# Patient Record
Sex: Male | Born: 1950 | Race: White | Hispanic: No | Marital: Married | Smoking: Never smoker
Health system: Southern US, Community
[De-identification: ages and names within clinical notes are randomized; demographics above are authoritative.]

## PROBLEM LIST (undated history)

## (undated) DIAGNOSIS — F32A Depression, unspecified: Secondary | ICD-10-CM

## (undated) DIAGNOSIS — J45909 Unspecified asthma, uncomplicated: Secondary | ICD-10-CM

## (undated) DIAGNOSIS — H269 Unspecified cataract: Secondary | ICD-10-CM

## (undated) DIAGNOSIS — G629 Polyneuropathy, unspecified: Secondary | ICD-10-CM

## (undated) DIAGNOSIS — F329 Major depressive disorder, single episode, unspecified: Secondary | ICD-10-CM

## (undated) DIAGNOSIS — E119 Type 2 diabetes mellitus without complications: Secondary | ICD-10-CM

## (undated) DIAGNOSIS — M86461 Chronic osteomyelitis with draining sinus, right tibia and fibula: Secondary | ICD-10-CM

## (undated) DIAGNOSIS — E785 Hyperlipidemia, unspecified: Secondary | ICD-10-CM

## (undated) DIAGNOSIS — G4730 Sleep apnea, unspecified: Secondary | ICD-10-CM

## (undated) HISTORY — DX: Major depressive disorder, single episode, unspecified: F32.9

## (undated) HISTORY — PX: OTHER SURGICAL HISTORY: SHX169

## (undated) HISTORY — DX: Depression, unspecified: F32.A

## (undated) HISTORY — DX: Unspecified asthma, uncomplicated: J45.909

## (undated) HISTORY — PX: COLONOSCOPY: SHX174

## (undated) HISTORY — DX: Hyperlipidemia, unspecified: E78.5

## (undated) HISTORY — DX: Type 2 diabetes mellitus without complications: E11.9

## (undated) HISTORY — DX: Unspecified cataract: H26.9

## (undated) HISTORY — PX: EYE SURGERY: SHX253

## (undated) HISTORY — PX: ORBITAL RECONSTRUCTION: SHX2115

---
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
## 2011-03-01 ENCOUNTER — Encounter (HOSPITAL_BASED_OUTPATIENT_CLINIC_OR_DEPARTMENT_OTHER): Payer: Self-pay | Attending: Internal Medicine

## 2011-03-01 DIAGNOSIS — S98139A Complete traumatic amputation of one unspecified lesser toe, initial encounter: Secondary | ICD-10-CM | POA: Insufficient documentation

## 2011-03-01 DIAGNOSIS — Z794 Long term (current) use of insulin: Secondary | ICD-10-CM | POA: Insufficient documentation

## 2011-03-01 DIAGNOSIS — Z87898 Personal history of other specified conditions: Secondary | ICD-10-CM | POA: Insufficient documentation

## 2011-03-01 DIAGNOSIS — Z79899 Other long term (current) drug therapy: Secondary | ICD-10-CM | POA: Insufficient documentation

## 2011-03-01 DIAGNOSIS — L97509 Non-pressure chronic ulcer of other part of unspecified foot with unspecified severity: Secondary | ICD-10-CM | POA: Insufficient documentation

## 2011-03-01 DIAGNOSIS — E1169 Type 2 diabetes mellitus with other specified complication: Secondary | ICD-10-CM | POA: Insufficient documentation

## 2011-03-02 NOTE — Progress Notes (Unsigned)
[facility]  NAME:  Rick Lane, Rick Lane                ACCOUNT NO.:  [id number]  MEDICAL RECORD NO.:  [id number]      DATE OF BIRTH:  1950/10/23  PHYSICIAN:  Jonelle Sports. Allyssa Abruzzese, M.D.  VISIT DATE:  03/01/2011                                  OFFICE VISIT   HISTORY:  This 60 year old white male with type 2 diabetes who just moved to [facility] the day prior to this consultation, is seen for evaluation and ongoing treatment of recent extensive surgery of the right foot.  The patient apparently stepped on a piece of glass in about mid April 2012, pulled the glass out himself and attempted to treat his lacerated foot on his own for the first 2-1/2 weeks.  Over that period of time, it became progressively swollen with drainage, odor, and skin discoloration, leading to his presentation eventually to hospital emergency room on Nov 25, 2010.  At that time, it was appreciated that his foot was gangrenous and second toe was gangrenous and the entire foot was deeply infected with erythema extending all the way to the ankle, generalized edema, and other evidence for deep infection.  He was admitted to the hospital and the following day, underwent a ray resection and second ray of that right foot underwent as well, irrigation of the wound and placement of vancomycin, containing beads. The wound was drained and closed.  Apparently subsequent to that, he did generally well but the wound did not exactly healed and finally after ongoing interventions and extensive intravenous antibiotics, he underwent on December 25, 2010, with general debridement of the wound, placement of Apligraf to multiple sites (at that point, he has had ulcerations laterally on the foot, on the plantar aspect of the foot, and 3 other lesser sites as well).  He underwent Apligraf 4 on the dorsolateral aspect of the foot in the wound base, one on the plantar aspect.  The primary wound was then covered with a GraftJacket  applied to the space left by the previous second ray amputation.  Apparently since that time, the wounds have healed other than the primary surgical wound and the GraftJacket there remains in place.  He has been told this will slough itself as the wound heals underneath. Apparently, his antibiotics were continued until 48 hours prior to this consultation here today and at that point, his PICC line discontinued. Notes obtained since the patient's departure from the clinic today from the surgeons in [place] show that the intent had been to continue him on Augmentin orally for at least 2 additional weeks but that was never established.  He will be seen in this clinic again in 48 hours and decision in that regard will be made at that time.  His past medical history is notable for no surgeries other than the toe amputation mentioned.  He has had medical hospitalizations in association with his diabetes, but not much else.  He has no known medicinal allergies.  His regular medications at the moment include: 1. Lantus insulin 30 units daily subcu. 2. Januvia 50 mg daily p.o. 3. Glyburide 2.5 mg daily p.o. 4. Zoloft 50 mg daily p.o.  The patient's family history is not reviewed in detail, but apparently is positive for type 2 diabetes in several other family  members.  PERSONAL HISTORY:  The patient is married, lives with his wife, has just moved to [place] in the past 48 hours as indicated.  He does not smoke or use alcohol or recreational drugs.  He was previously employed by [employer] in the [employer] area but that employment recently ended and he is vague about the details and apparently very unhappy and depressed about that and no longer carries the insurance that he had when he was with [employer].  REVIEW OF SYSTEMS:  HEAD, EYES, EARS, NOSE, and THROAT:  The patient denies any specific diabetic problems with his eyes, although he does have cataracts which  certainly may well be related.  His hearing is good.  He had asthma as a child but has no significant upper respiratory allergies at this stage in his life.  PULMONARY:  No history of pneumonia, tuberculosis.  No current symptomatology.  No residual from his childhood asthma.  CARDIAC:  No history of angina, known heart disease, or use of any cardiac medications.  GASTROINTESTINAL:  No history of blood loss, significant reflux, chronic diarrhea, or hepatobiliary disease.  GENITOURINARY:  Unaware of any prostate problems, has no history of urinary tract infection.  ORTHOPEDIC:  No history of lumbar disk disease, no major joint problems, no joint surgeries.  NEUROLOGIC:  Does have some neuropathic symptoms in his distal lower extremities, but otherwise has had no history of seizures, CVA, or other central nervous issues.  PHYSICAL EXAMINATION:  VITAL SIGNS:  Blood pressure is 162/82, pulse is 89 and regular, respirations 18, temperature 98.1, blood glucose 110 mg/daily. GENERAL:  This is a healthy-appearing late middle-aged white male, in no immediate distress.  His wife joins him in the clinic, both are alert and cooperative.  He does seem to, however, some resentment and depression related to his recent employment change. SKIN:  Warm and dry. HEENT:  Mucous membranes are moist.  Dental, dentition appears satisfactory. NECK:  There is no neck vein engorgement.  Carotid pulses are palpable. No obvious thyromegaly. CHEST:  Grossly clear to auscultation. CARDIAC:  Heart tones are good quality.  There is an S4 heard at the cardiac base. ABDOMEN:  Nontender without organomegaly or masses. EXTREMITIES:  Free of edema.  There are some stasis changes in the distal lower extremities bilaterally, but no evidence of prior ulcerations there.  The pulses are palpable at all 4 locations in the feet and appeared adequate.  There is surgical absence of the left second toe with evidence of a ray  amputation and that area now is covered with a GraftJacket and there is some inflammatory change at the margins of this, but nothing excessive.  There is a very fluctuant fluid- filled component to the wound along the area of the first metatarsal which the patient was unaware of.  There is no significant odor.  There appears to be no surrounding imminent tissue loss at this time.  An area on the plantar aspect of the foot that appears to be the site of previous ulcer is well epithelialized, presumably secondary to the previous placement of an Apligraf in that area.  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  Currently, has Apligraf that have been placed under a GraftJacket and the wound appears to be healing satisfactorily.  DISPOSITION: 1. Request is made for the patient's records which will be absolutely     essential to  his continued management. 2. The GraftJacket is debrided around its margins where there is some     loose tissue.  In addition, the area that appears fluctuant is     pierced and serous fluid expressed, this is cultured.  There is no     odor and no gross purulence to this material.  The patient's foot is then dressed with application of silver alginate over the wound area to deal with any continued drainage from the puncture site that I have created and the foot is then placed in a bulky wrap.  He has previously been in a posterior mold splint which is absolutely totally broken down and is discarded.  My plan is to place him in a walking boot today, but the fact that we do not have a boot of adequate size.  Accordingly, he was placed in a Darco walker with care taken to be sure that we do not irritate the recently healed ulcer on the plantar aspect of that foot.  Arrangements were made for his return to this clinic in 2 days for evaluation by our clinic  director, plastic surgeon.          ______________________________ Jonelle Sports Cheryll Cockayne, M.D.     RES/MEDQ  D:  03/01/2011  T:  03/02/2011  Job:  [id number]

## 2011-03-17 NOTE — Progress Notes (Signed)
[facility]  NAME:  Rick Lane, Rick Lane NO.:  [id number]  MEDICAL RECORD NO.:  [id number]      DATE OF BIRTH:  [birth year]  PHYSICIAN:  Wayland Denis, DO            VISIT DATE:                                  OFFICE VISIT   Rick Lane is a 60 year old white male who is here for followup with his wife for a right foot ulcer.  We did quite a bit of debriding last week and used Santyl and hydrogel.  He actually had some good improvement with some granulating tissue.  He does have a tendon exposed, this is going to be a little difficult to granulate over now a bit of a cavity, but still possible.  There have been no changes in his medications, social history.  He is still working through the process of getting all the paperwork filled out for financial aid.  We would like him to continue with that.  He does not see how hyperbaric will work for him, so that is not an option.  PHYSICAL EXAMINATION:  GENERAL:  He is alert, oriented, cooperative, no acute distress, pleasant. HEENT:  Pupils equal.  Extraocular muscles are intact. NECK:  No cervical lymphadenopathy. LUNGS:  Breathing is unlabored. HEART:  Regular. EXTREMITIES:  Lower extremities as described and was debrided down to and including tendon.  We will continue with Santyl, collagen, hydrogel, alginate, Kerlix netting and followup in a week.  We may also be able to place Oasis on him.     Wayland Denis, DO     CS/MEDQ  D:  03/17/2011  T:  03/17/2011  Job:  [id number]

## 2011-03-24 NOTE — Progress Notes (Signed)
[facility]  NAME:  KASH, DAVIE NO.:  [id number]  MEDICAL RECORD NO.:  [id number]      DATE OF BIRTH:  [birth year]  PHYSICIAN:  Wayland Denis, DO       VISIT DATE:  03/24/2011                                  OFFICE VISIT   [name] is a 60 year old white male here with his wife for followup on his right lower extremity diabetic foot ulcer.  He has been using Santyl, alginate, and collagen over this past week.  Some drains came through the dressing, so he dressed it with Silvadene.  Otherwise, he has been doing well.  He is still on worker's comp and going through the process of filling up the paperwork for disability.  There has been no change in his medications.  His blood sugars have been managed.  REVIEW OF SYSTEMS:  Unchanged.  SOCIAL HISTORY:  Unchanged.  PHYSICAL EXAMINATION:  GENERAL:  On exam, he is alert, oriented, cooperative, in no acute distress, pleasant. HEENT:  Pupils are equal.  Extraocular muscles are intact. NECK:  No cervical lymphadenopathy. LUNGS:  Clear. HEART:  Regular. ABDOMEN:  Soft. EXTREMITIES:  Lower extremity is as described and is debrided too. There is quite a bit of fibrous tissue.  We were able to debride out a tiny bit of some tendon in the first interspace.  Recommend continuing with Santyl, collagen, alginate, and a wrap and we will see him back in 1 week.  He would be a candidate in the next week of two for [employer].  He is trying to go through the process of getting his care covered.  In the meantime, we will continue with what we have.     Wayland Denis, DO     CS/MEDQ  D:  03/24/2011  T:  03/24/2011  Job:  [id number]

## 2011-03-31 ENCOUNTER — Encounter (HOSPITAL_BASED_OUTPATIENT_CLINIC_OR_DEPARTMENT_OTHER): Payer: Self-pay | Attending: Plastic Surgery

## 2011-03-31 DIAGNOSIS — L97509 Non-pressure chronic ulcer of other part of unspecified foot with unspecified severity: Secondary | ICD-10-CM | POA: Insufficient documentation

## 2011-03-31 DIAGNOSIS — E1169 Type 2 diabetes mellitus with other specified complication: Secondary | ICD-10-CM | POA: Insufficient documentation

## 2011-03-31 DIAGNOSIS — S98139A Complete traumatic amputation of one unspecified lesser toe, initial encounter: Secondary | ICD-10-CM | POA: Insufficient documentation

## 2011-03-31 NOTE — Progress Notes (Signed)
[facility]  NAME:  Rick Lane, Rick Lane NO.:  [id number]  MEDICAL RECORD NO.:  [id number]      DATE OF BIRTH:  04-17-1951  PHYSICIAN:  Wayland Denis, DO            VISIT DATE:                                  OFFICE VISIT   HISTORY OF PRESENT ILLNESS:  Mr. Dross is a 60 year old white male who is here on follow-up for his right lower extremity diabetic foot ulcer.  He has been using Santyl and calcium alginate on the distal portion of his foot dorsally and then Silvercel on the plantar aspect.  Both are looking much better, they are filling in, granulating and epithelializing, no signs of infection.  There has been no change in his medications.  His blood sugars have been controlled.  He has had an episode of what sounds like orthostatic hypotension where he got dizzy after standing when he was in the garden and we have encouraged them to see a primary care doctor about this.  His wife states that she is going to see about that tomorrow.  REVIEW OF SYSTEMS:  Otherwise, negative.  PHYSICAL EXAMINATION:  GENERAL:  He is alert, oriented, cooperative. RESPIRATIONS:  His breathing is unlabored. HEART:  His pulses equal.  Wound is as described.  We will continue with the Santyl, calcium alginate, and Silvercel and have him follow up in a week.     Wayland Denis, DO     CS/MEDQ  D:  03/31/2011  T:  03/31/2011  Job:  [id number]

## 2011-04-07 NOTE — Progress Notes (Signed)
[facility]  NAME:  Rick Lane, Rick Lane NO.:  [id number]  MEDICAL RECORD NO.:  [id number]      DATE OF BIRTH:  Aug 29, 1950  PHYSICIAN:  Wayland Denis, DO       VISIT DATE:  04/07/2011                                  OFFICE VISIT   [name] is here with his wife for followup on his right lower extremity diabetic foot ulcer.  He is doing very well and very pleased with his result at this point.  He has been using the Santyl with collagen and calcium alginate and it is pretty much needed to change daily.  He has got very good granulation tissue.  No sign of infection. It seems to be epithelializing the wound that was on the plantar aspect that has a little scab over it, but seems to be completely healed. Otherwise, there has been no change in his medication or social history.  On exam he is alert, oriented, cooperative and not in any distress, very pleasant.  Pupils are equal.  Extraocular muscles are intact.  Breathing is unlabored.  Pulses are strong.  Wound is as described in the nurse's note.  We will continue with the Santyl, collagen, calcium alginate.  I did debride today and we will see him back in 1 week.     Wayland Denis, DO     CS/MEDQ  D:  04/07/2011  T:  04/07/2011  Job:  [id number]

## 2011-04-15 NOTE — Progress Notes (Signed)
[facility]  NAME:  Rick, Lane NO.:  [id number]  MEDICAL RECORD NO.:  [id number]      DATE OF BIRTH:  Apr 04, 1951  PHYSICIAN:  Wayland Denis, DO            VISIT DATE:                                  OFFICE VISIT   Rick Lane is here with his wife for followup on his right lower extremity ulcer where he had an amputation and is actually doing better, it is filling in, getting smaller, granulating.  I debrided a bit.  He is using the Santyl and this seems to be helping.  He does not have any pain.  There is some tunneling that goes in likely where the bone was. That is going to take a long time to heal in and it does concern me that may get infected if the skin closes before that fills in.  There has been no change in his medications or social history.  He still has not filled out any of the forms for financial aid.  He is still getting full salary, so it is unlikely that he would be eligible for any.  He did get a primary care doctor who will manage his blood sugars.  I think that that will help.  On exam he is alert, oriented, cooperative, not in any acute distress. His glasses were on but his extraocular muscles are intact.  His breathing is unlabored.  Heart is regular.  His pulses are weak.  He has got good capillary refill but obvious venous insufficiency.  The wound is as described and was debrided, would like to continue with collagen, Santyl, hydrogel, calcium alginate that will help with some of the drainage and we will see him back.  We also gave him some pricing on hemolysis.  I think this would really help fill in the area so that they can granulate in and not become source of infection and they are going to take that into consideration.     Wayland Denis, DO     CS/MEDQ  D:  04/14/2011  T:  04/15/2011  Job:  [id number]

## 2011-04-21 NOTE — Progress Notes (Signed)
[facility]  NAME:  Rick Lane, Rick Lane NO.:  [id number]  MEDICAL RECORD NO.:  [id number]      DATE OF BIRTH:  [birth year]  PHYSICIAN:  Wayland Denis, DO       VISIT DATE:  04/21/2011                                  OFFICE VISIT   Rick Lane is a 60 year old male who is here for follow up on his right lower extremity first with stress ulcer.  He has been using Santyl.  He has undergone debridement.  Overall, there is very good improvement. The wound is getting smaller and filling in, and there is still probing to bone, but this is looking overall much better.  The wound on his plantar aspect has healed completely.  There is a little bit of a scab there now, which he needs to keep cream on, but otherwise, he is doing very well.  There has been no change in his medications or social history.  PHYSICAL EXAMINATION:  He is alert, oriented, and cooperative, not on any acute distress.  He is pleasant.  Pupils are equal.  Extraocular muscles are intact.  His breathing is unlabored.  His heart is regular. His wound is as described.  We will continue with Santyl, collagen, and have him follow up in 1 week.     Wayland Denis, DO     CS/MEDQ  D:  04/21/2011  T:  04/21/2011  Job:  [id number]

## 2011-05-05 ENCOUNTER — Encounter (HOSPITAL_BASED_OUTPATIENT_CLINIC_OR_DEPARTMENT_OTHER): Payer: Self-pay | Attending: Plastic Surgery

## 2011-05-05 DIAGNOSIS — E1169 Type 2 diabetes mellitus with other specified complication: Secondary | ICD-10-CM | POA: Insufficient documentation

## 2011-05-05 DIAGNOSIS — L97509 Non-pressure chronic ulcer of other part of unspecified foot with unspecified severity: Secondary | ICD-10-CM | POA: Insufficient documentation

## 2011-05-05 DIAGNOSIS — S98139A Complete traumatic amputation of one unspecified lesser toe, initial encounter: Secondary | ICD-10-CM | POA: Insufficient documentation

## 2011-05-06 NOTE — Progress Notes (Signed)
[facility]  NAME:  Rick Lane, Rick Lane NO.:  [id number]  MEDICAL RECORD NO.:  [id number]      DATE OF BIRTH:  03-Oct-1950  PHYSICIAN:  Wayland Denis, DO       VISIT DATE:  05/05/2011                                  OFFICE VISIT   Mr. Dacus is here with his wife for followup on his right lower extremity ulcer.  He has been using silver chloride and iodoform.  The wound is looking great, but the periwound area is really irritated and chapped. This is likely secondary to the iodoform and/or the drainage.  SOCIAL HISTORY:  There has been no change in his social history.  MEDICATIONS:  Have change in that he was taken off the Cipro.  Culture was done by his PCP, which showed some staph and skin flora. The staph does appear to be sensitive to Augmentin.  PHYSICAL EXAMINATION:  GENERAL:  He is alert, oriented, and cooperative. He is a good historian. HEENT:  Pupils are equal.  Extraocular muscles are intact.  No cervical lymphadenopathy.  His breathing is unlabored. HEART:  Regular.  His wound is as described in the nurse's note and improving both in depth and size and granulating.  It does not appear to be having draining any purulence at present.  We are going to do Bag Balm and PCA to the periwound area with collagen and calcium alginate to the wound and encouraged elevation of multivitamin and have him follow up in 1 week.     Wayland Denis, DO     CS/MEDQ  D:  05/05/2011  T:  05/05/2011  Job:  [id number]

## 2011-06-02 ENCOUNTER — Encounter (HOSPITAL_BASED_OUTPATIENT_CLINIC_OR_DEPARTMENT_OTHER): Payer: Self-pay | Attending: Plastic Surgery

## 2011-06-02 DIAGNOSIS — L97509 Non-pressure chronic ulcer of other part of unspecified foot with unspecified severity: Secondary | ICD-10-CM | POA: Insufficient documentation

## 2011-06-02 DIAGNOSIS — S98139A Complete traumatic amputation of one unspecified lesser toe, initial encounter: Secondary | ICD-10-CM | POA: Insufficient documentation

## 2011-06-02 DIAGNOSIS — E1169 Type 2 diabetes mellitus with other specified complication: Secondary | ICD-10-CM | POA: Insufficient documentation

## 2011-06-30 ENCOUNTER — Encounter (HOSPITAL_BASED_OUTPATIENT_CLINIC_OR_DEPARTMENT_OTHER): Payer: Self-pay

## 2011-07-06 ENCOUNTER — Other Ambulatory Visit (HOSPITAL_COMMUNITY): Payer: Self-pay | Admitting: Interventional Cardiology

## 2011-07-06 DIAGNOSIS — R9431 Abnormal electrocardiogram [ECG] [EKG]: Secondary | ICD-10-CM

## 2011-07-07 ENCOUNTER — Encounter (HOSPITAL_BASED_OUTPATIENT_CLINIC_OR_DEPARTMENT_OTHER): Payer: Self-pay | Attending: Plastic Surgery

## 2011-07-07 DIAGNOSIS — E119 Type 2 diabetes mellitus without complications: Secondary | ICD-10-CM | POA: Insufficient documentation

## 2011-07-07 DIAGNOSIS — L989 Disorder of the skin and subcutaneous tissue, unspecified: Secondary | ICD-10-CM | POA: Insufficient documentation

## 2011-07-07 DIAGNOSIS — L97809 Non-pressure chronic ulcer of other part of unspecified lower leg with unspecified severity: Secondary | ICD-10-CM | POA: Insufficient documentation

## 2011-07-08 NOTE — Progress Notes (Signed)
[facility]  NAME:  Rick Lane NO.:  [id number]  MEDICAL RECORD NO.:  [id number]      DATE OF BIRTH:  05-27-1951  PHYSICIAN:  Wayland Denis, DO            VISIT DATE:                                  OFFICE VISIT   HISTORY:  Rick Lane is a 60 year old male here for followup on a right lower extremity ulcer.  This had healed up but due to recent increase in activity, which included relocation, it opened up.  He has not been doing anything particular on it.  There has been no change in his medications or review of systems.  PHYSICAL EXAMINATION:  GENERAL:  He is alert, oriented, cooperative, not in any acute distress.  He seems to be a good historian. HEENT:  Pupils are equal.  Extraocular muscles are intact. LUNGS:  His breathing is unlabored. HEART:  Regular.  His pulses of the upper extremity are strong.  Lower extremity, he has some maculopapular rash of the shin tibial area medially and in a strip well demarcated erythematous and then more anteriorly there is another one with maculopapules.  They are not continuous and several small ones that are clustered together.  It looks like a contact dermatitis, might be secondary to new footwear or new socks, but we are going to continue with Silvercel gauze, Kerlix, netting, elevation, multivitamins and a different pair of socks and we will have him follow up in a week.     Wayland Denis, DO     CS/MEDQ  D:  07/07/2011  T:  07/08/2011  Job:  [id number]

## 2011-07-15 NOTE — Progress Notes (Signed)
[facility]  NAME:  JOSSIAH, SMOAK                     ACCOUNT NO.:  MEDICAL RECORD NO.:  [id number]      DATE OF BIRTH:  09-02-1950  PHYSICIAN:  Wayland Denis, DO       VISIT DATE:  07/14/2011                                  OFFICE VISIT   Mr. Senkbeil is here with his wife for followup on his right lower extremity ulcers.  He does have diabetes.  He has been using Silvercel, Kerlix, and netting with some improvement in all of the areas.  He has got some granulation as well as epithelialization.  He has actually filled in very well.  He still has some areas that look like a contact dermatitis on his leg medially and then on his upper and lower eyelids.  This is a little bit concerning and I am not sure what it is, and would like Dermatology to take a look.  There has been no change in his medications or social history.  There has been no new lotions or detergent so far as they can identify.  PHYSICAL EXAMINATION:  GENERAL:  He is alert, oriented, cooperative, not in any acute distress.  He is pleasant. HEENT:  Pupils are equal.  Extraocular muscles are intact. NECK:  No cervical lymphadenopathy. CHEST:  His breathing is unlabored. HEART:  Regular. SKIN:  Dry.  We are going to use __________.  The ulcers are as described.  We will continue with collagen.  Recommend elevation and offloading and have encouraged him to follow up with their podiatrist to be sure that he is in the proper footwear.  We will see him back in 1-2 weeks.     Wayland Denis, DO     CS/MEDQ  D:  07/14/2011  T:  07/15/2011  Job:  [id number]

## 2011-07-28 ENCOUNTER — Encounter (HOSPITAL_BASED_OUTPATIENT_CLINIC_OR_DEPARTMENT_OTHER): Payer: Self-pay | Attending: Plastic Surgery

## 2011-07-28 DIAGNOSIS — L97809 Non-pressure chronic ulcer of other part of unspecified lower leg with unspecified severity: Secondary | ICD-10-CM | POA: Insufficient documentation

## 2011-07-29 NOTE — Progress Notes (Signed)
[facility]  NAME:  Rick Lane, Rick Lane NO.:  [id number]  MEDICAL RECORD NO.:  [id number]      DATE OF BIRTH:  02-25-1951  PHYSICIAN:  Wayland Denis, DO       VISIT DATE:  07/28/2011                                  OFFICE VISIT   Mr. Conely is a 61 year old gentleman who is here with his wife.  His foot ulcer that was at the base of his great toe and between his first and second webspace.  It is completely healed and doing remarkably well.  He still has a little bit of ulceration on the plantar aspect of his foot that has improved since the last visit.  Unfortunately, he has a rash on his foot and lower leg area that looks like a contact dermatitis.  He has two additional areas that look like either a bug bite or spider bite.  The posterior one is just slightly red around it, but the anterior one has about a 2-cm margin of redness and little bit of tenderness.  There has been no change in his medications, social history, or review of systems.  PHYSICAL EXAMINATION:  He is alert, and oriented, cooperative, not in any acute distress.  He is pleasant for the exam.  Pupils are equal. Extraocular muscles are intact.  His breathing is unlabored.  His heart is regular.  His pulses are present.  His skin is very dry and the wound is as described as well as the leg and the foot.  We will continue with SilverCel on the plantar area and Silvadene at two areas that look like they have been bitten and we will see about getting him in to see a dermatologist as soon as possible.  We had tried hydrocortisone cream, but that did not seem to help at all.  So, I am not sure what is going on or what it is irritating this, but we will see if Dermatology can help.     Wayland Denis, DO     CS/MEDQ  D:  07/28/2011  T:  07/29/2011  Job:  [id number]

## 2011-07-30 ENCOUNTER — Ambulatory Visit (HOSPITAL_COMMUNITY): Payer: Self-pay

## 2011-07-30 ENCOUNTER — Encounter (HOSPITAL_COMMUNITY)
Admission: RE | Admit: 2011-07-30 | Discharge: 2011-07-30 | Disposition: A | Discharge status: Home / Self Care | Payer: Self-pay | Source: Ambulatory Visit | Attending: Interventional Cardiology | Admitting: Interventional Cardiology

## 2011-07-30 DIAGNOSIS — R9431 Abnormal electrocardiogram [ECG] [EKG]: Secondary | ICD-10-CM | POA: Insufficient documentation

## 2011-08-02 ENCOUNTER — Emergency Department (HOSPITAL_COMMUNITY): Payer: Self-pay

## 2011-08-02 ENCOUNTER — Emergency Department (HOSPITAL_COMMUNITY)
Admission: EM | Admit: 2011-08-02 | Discharge: 2011-08-02 | Disposition: A | Discharge status: Home / Self Care | Payer: Self-pay | Attending: Emergency Medicine | Admitting: Emergency Medicine

## 2011-08-02 ENCOUNTER — Encounter (HOSPITAL_COMMUNITY): Payer: Self-pay | Admitting: Emergency Medicine

## 2011-08-02 DIAGNOSIS — L0390 Cellulitis, unspecified: Secondary | ICD-10-CM

## 2011-08-02 DIAGNOSIS — I872 Venous insufficiency (chronic) (peripheral): Secondary | ICD-10-CM | POA: Insufficient documentation

## 2011-08-02 DIAGNOSIS — L0291 Cutaneous abscess, unspecified: Secondary | ICD-10-CM | POA: Insufficient documentation

## 2011-08-02 DIAGNOSIS — Z794 Long term (current) use of insulin: Secondary | ICD-10-CM | POA: Insufficient documentation

## 2011-08-02 LAB — CBC
HCT: 37.6 % — ABNORMAL LOW (ref 39.0–52.0)
MCH: 29.6 pg (ref 26.0–34.0)
MCHC: 34 g/dL (ref 30.0–36.0)
MCV: 86.8 fL (ref 78.0–100.0)
Platelets: 274 10*3/uL (ref 150–400)
RDW: 13 % (ref 11.5–15.5)

## 2011-08-02 LAB — BASIC METABOLIC PANEL
CO2: 25 mEq/L (ref 19–32)
Calcium: 10 mg/dL (ref 8.4–10.5)
Creatinine, Ser: 0.89 mg/dL (ref 0.50–1.35)
GFR calc non Af Amer: >90 mL/min (ref >90)
Sodium: 137 mEq/L (ref 135–145)

## 2011-08-02 LAB — DIFFERENTIAL
Basophils Absolute: 0 10*3/uL (ref 0.0–0.1)
Eosinophils Absolute: 0.4 10*3/uL (ref 0.0–0.7)
Eosinophils Relative: 6 % — ABNORMAL HIGH (ref 0–5)
Lymphocytes Relative: 25 % (ref 12–46)
Monocytes Absolute: 0.6 10*3/uL (ref 0.1–1.0)

## 2011-08-02 LAB — GLUCOSE, CAPILLARY: Glucose-Capillary: 149 mg/dL — ABNORMAL HIGH (ref 70–99)

## 2011-08-02 MED ORDER — CEPHALEXIN 500 MG PO CAPS
500.0000 mg | ORAL_CAPSULE | Freq: Once | ORAL | Status: AC
  Administered 2011-08-02: 500 mg via ORAL
  Filled 2011-08-02: qty 1

## 2011-08-02 MED ORDER — SULFAMETHOXAZOLE-TMP DS 800-160 MG PO TABS: 2.0000 | ORAL_TABLET | Freq: Two times a day (BID) | ORAL | Status: AC

## 2011-08-02 MED ORDER — CEPHALEXIN 500 MG PO CAPS: 500.0000 mg | ORAL_CAPSULE | Freq: Four times a day (QID) | ORAL | Status: AC

## 2011-08-02 MED ORDER — SULFAMETHOXAZOLE-TMP DS 800-160 MG PO TABS
2.0000 | ORAL_TABLET | Freq: Once | ORAL | Status: AC
  Administered 2011-08-02: 2 via ORAL
  Filled 2011-08-02: qty 2

## 2011-08-02 NOTE — ED Notes (Signed)
Pt being treated at the wound care center for this right lower leg infection, today he states that it looks worse and the RN told them to come here for further evaluation

## 2011-08-02 NOTE — ED Notes (Signed)
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

## 2011-08-02 NOTE — ED Provider Notes (Signed)
History     CSN: [id number]  Arrival date & time 08/02/11  1240   First MD Initiated Contact with Patient 08/02/11 1411      Chief Complaint  Patient presents with  . Wound Infection    (Consider location/radiation/quality/duration/timing/severity/associated sxs/prior treatment) HPI 61 yo M presents today with left lower extremity rash that began approximately 7 days ago.  He is followed at the wound care center for a chronic left foot wound that at one time had underlying osteomyelitis that required a 1 month hospitalization and surgical debridement to control.  Patient is a diabetic and reports that his glucoses have been between 120-150.  He reports that this is higher than usual for him.  He was started on doxycycline for his rash 3 days ago by Dr. Kelly Splinter of the [facility].  His rash is surrounding a central scabbed over break in the skin.  Patient has no pain but he also has severe neuropathy.  He denies nausea, vomiting, or fevers.  There are no other associated or modifying factors.   History reviewed. No pertinent past medical history.  No past surgical history on file.  No family history on file.  History  Substance Use Topics  . Smoking status: Not on file  . Smokeless tobacco: Not on file  . Alcohol Use: Not on file      Review of Systems  Constitutional: Negative.   HENT: Negative.   Eyes: Negative.   Respiratory: Negative.   Cardiovascular: Negative.   Gastrointestinal: Negative.   Genitourinary: Negative.   Musculoskeletal: Negative.   Skin: Positive for rash.  Neurological: Negative.   Hematological: Negative.   Psychiatric/Behavioral: Negative.   All other systems reviewed and are negative.    Allergies  Review of patient's allergies indicates no known allergies.  Home Medications   Current Outpatient Rx  Name Route Sig Dispense Refill  . GLYBURIDE 2.5 MG PO TABS Oral Take 2.5 mg by mouth 2 (two) times daily.      . INSULIN GLARGINE 100  UNIT/ML Alice SOLN Subcutaneous Inject 30 Units into the skin daily.      Marland Kitchen OVER THE COUNTER MEDICATION Topical Apply 1 application topically as needed. Bag Balm Ointment applied as needed     . SERTRALINE HCL 50 MG PO TABS Oral Take 50 mg by mouth daily.      . CEPHALEXIN 500 MG PO CAPS Oral Take 1 capsule (500 mg total) by mouth 4 (four) times daily. 40 capsule 0  . SULFAMETHOXAZOLE-TMP DS 800-160 MG PO TABS Oral Take 2 tablets by mouth 2 (two) times daily. 40 tablet 0    BP 115/60  Pulse 96  Temp(Src) 98.1 F (36.7 C) (Oral)  Resp 20  SpO2 97%  Physical Exam  Nursing note and vitals reviewed. Constitutional: He is oriented to person, place, and time. He appears well-developed and well-nourished. No distress.  HENT:  Head: Normocephalic and atraumatic.  Eyes: Conjunctivae and EOM are normal. Pupils are equal, round, and reactive to light.  Neck: Normal range of motion.  Cardiovascular: Normal rate, regular rhythm, normal heart sounds and intact distal pulses.  Exam reveals no gallop and no friction rub.   No murmur heard. Pulmonary/Chest: Effort normal and breath sounds normal. No respiratory distress. He has no wheezes. He has no rales.  Abdominal: Soft. Bowel sounds are normal. He exhibits no distension. There is no tenderness. There is no rebound and no guarding.  Musculoskeletal: Normal range of motion. He exhibits no edema  and no tenderness.  Neurological: He is alert and oriented to person, place, and time. No cranial nerve deficit. He exhibits normal muscle tone. Coordination normal.  Skin: Skin is warm and dry. Rash noted.       Patient has chronic changes of venous stasis in the lower extremities, left   Psychiatric: He has a normal mood and affect.       Break in the left lower extremity skin with scabbing and area 5 inches in diameter with induration, erythema, but no underlying fluctuance    ED Course  Procedures (including critical care time)  Labs Reviewed  CBC -  Abnormal; Notable for the following:    Hemoglobin 12.8 (*)    HCT 37.6 (*)    All other components within normal limits  DIFFERENTIAL - Abnormal; Notable for the following:    Eosinophils Relative 6 (*)    All other components within normal limits  BASIC METABOLIC PANEL - Abnormal; Notable for the following:    Glucose, Bld 150 (*)    All other components within normal limits  GLUCOSE, CAPILLARY - Abnormal; Notable for the following:    Glucose-Capillary 149 (*)    All other components within normal limits  POCT CBG MONITORING   Dg Tibia/fibula Right  08/02/2011  *RADIOLOGY REPORT*  Clinical Data: Abrasion over the anterior tibia / fibula.  Erythema radiates to the foot.  RIGHT TIBIA AND FIBULA - 2 VIEW  Comparison: None.  Findings: No fracture, foreign body, or acute bony findings are identified. No significant abnormal gas is observed in the soft tissues of the lower leg.  No knee effusion is identified.  Atherosclerotic calcification noted along the popliteal artery and its branches.  No findings of osteomyelitis are observed.  IMPRESSION:  1.  No gas in the soft tissues or underlying osseous abnormality is noted.  No foreign body is observed.  Original Report Authenticated By: Dellia Cloud, M.D.     1. Cellulitis       MDM  Patient was evaluated and had no systemic signs of infection.  Patient was not hyperglycemic and had no anion gap.  CBC was also unremarkable.  Patient did have plain film to confirm no obvious gas or signs of osteomyelitis given patient history and this was negative.  Patient was given 2 tabs of bactrim as well as keflex 500 mg.  Patient was told to discontinue doxy and was discharged with 10 days of both antibiotics.  He was discharged in good condition with precautions for reasons to return.        Cyndra Numbers, MD 08/02/11 2234

## 2011-08-02 NOTE — ED Notes (Signed)
Dr. Alto Denver at bedside, wound to right lower leg marked by Dr. Alto Denver.

## 2011-08-02 NOTE — ED Notes (Signed)
Patient ambulated to X-ray 

## 2011-08-03 ENCOUNTER — Ambulatory Visit (HOSPITAL_COMMUNITY)
Admission: RE | Admit: 2011-08-03 | Discharge: 2011-08-03 | Disposition: A | Discharge status: Home / Self Care | Payer: Self-pay | Source: Ambulatory Visit | Attending: Interventional Cardiology | Admitting: Interventional Cardiology

## 2011-08-03 ENCOUNTER — Encounter (HOSPITAL_COMMUNITY)
Admission: RE | Admit: 2011-08-03 | Discharge: 2011-08-03 | Disposition: A | Discharge status: Home / Self Care | Payer: Self-pay | Source: Ambulatory Visit | Attending: Interventional Cardiology | Admitting: Interventional Cardiology

## 2011-08-03 DIAGNOSIS — E119 Type 2 diabetes mellitus without complications: Secondary | ICD-10-CM | POA: Insufficient documentation

## 2011-08-03 DIAGNOSIS — R9431 Abnormal electrocardiogram [ECG] [EKG]: Secondary | ICD-10-CM | POA: Insufficient documentation

## 2011-08-03 DIAGNOSIS — E7800 Pure hypercholesterolemia, unspecified: Secondary | ICD-10-CM | POA: Insufficient documentation

## 2011-08-03 DIAGNOSIS — I4949 Other premature depolarization: Secondary | ICD-10-CM | POA: Insufficient documentation

## 2011-08-03 MED ORDER — TECHNETIUM TC 99M TETROFOSMIN IV KIT
10.0000 | PACK | Freq: Once | INTRAVENOUS | Status: AC | PRN
  Administered 2011-08-03: 10 via INTRAVENOUS

## 2011-08-03 MED ORDER — TECHNETIUM TC 99M TETROFOSMIN IV KIT
30.0000 | PACK | Freq: Once | INTRAVENOUS | Status: AC | PRN
  Administered 2011-08-03: 30 via INTRAVENOUS

## 2011-08-03 MED ORDER — REGADENOSON 0.4 MG/5ML IV SOLN
0.4000 mg | Freq: Once | INTRAVENOUS | Status: AC
  Administered 2011-08-03: 0.4 mg via INTRAVENOUS

## 2011-08-03 MED ORDER — REGADENOSON 0.4 MG/5ML IV SOLN
INTRAVENOUS | Status: AC
  Filled 2011-08-03: qty 5

## 2011-08-04 ENCOUNTER — Encounter (HOSPITAL_BASED_OUTPATIENT_CLINIC_OR_DEPARTMENT_OTHER): Payer: Self-pay

## 2011-08-19 NOTE — Progress Notes (Signed)
[facility]  NAME:  Rick Lane, Rick Lane NO.:  [id number]  MEDICAL RECORD NO.:  [id number]      DATE OF BIRTH:  Aug 02, 1950  PHYSICIAN:  Wayland Denis, DO       VISIT DATE:  08/18/2011                                  OFFICE VISIT   Rick Lane is a 61 year old white male who is here for followup on his right lower extremity ulcer.  He has done extremely well and has finally healed the entire area.  He is very pleased with the results.  He does have some very dry skin with some redness on the anterior aspect of the tibia and we had treated him with doxycycline.  He said that it got even more red, so he went to the emergency room.  The doxycycline was stopped and he was switched to Keflex and Bactrim.  He took it for 10 days.  On the 9th day, he had a body rash that resolved the following day, so it is not clear as to the cause of the rash on the body, but the leg is doing a little bit better.  He does have an appointment with dermatology which we have recommended that he be sure to keep.  There has been no other change in his medications or social history.  PHYSICAL EXAMINATION:  GENERAL:  He is alert, oriented, cooperative, not in any acute distress.  He is pleasant. HEENT:  Pupils are equal.  Extraocular muscles are intact. NECK:  No cervical lymphadenopathy. LUNGS:  His breathing is unlabored. CARDIAC:  Heart is regular. ABDOMEN:  Soft.  He seems to be doing very well.  The wound on the distal foot have healed extremely well.  and recommend lotion to keep his skin from drying out and definitely keep the appointment with the dermatologist and we will see him back as needed.     Wayland Denis, DO     CS/MEDQ  D:  08/18/2011  T:  08/19/2011  Job:  [id number]

## 2015-08-11 ENCOUNTER — Encounter: Payer: Self-pay | Admitting: Family Medicine

## 2015-08-11 ENCOUNTER — Ambulatory Visit (INDEPENDENT_AMBULATORY_CARE_PROVIDER_SITE_OTHER): Payer: BLUE CROSS/BLUE SHIELD | Admitting: Family Medicine

## 2015-08-11 VITALS — BP 158/85 | HR 87 | Temp 98.0°F | Ht 75.5 in | Wt 278.4 lb

## 2015-08-11 DIAGNOSIS — F329 Major depressive disorder, single episode, unspecified: Secondary | ICD-10-CM

## 2015-08-11 DIAGNOSIS — Z794 Long term (current) use of insulin: Secondary | ICD-10-CM

## 2015-08-11 DIAGNOSIS — E1142 Type 2 diabetes mellitus with diabetic polyneuropathy: Secondary | ICD-10-CM

## 2015-08-11 DIAGNOSIS — E559 Vitamin D deficiency, unspecified: Secondary | ICD-10-CM

## 2015-08-11 DIAGNOSIS — F39 Unspecified mood [affective] disorder: Secondary | ICD-10-CM | POA: Insufficient documentation

## 2015-08-11 DIAGNOSIS — F32A Depression, unspecified: Secondary | ICD-10-CM | POA: Insufficient documentation

## 2015-08-11 DIAGNOSIS — L97519 Non-pressure chronic ulcer of other part of right foot with unspecified severity: Secondary | ICD-10-CM

## 2015-08-11 DIAGNOSIS — E119 Type 2 diabetes mellitus without complications: Secondary | ICD-10-CM | POA: Insufficient documentation

## 2015-08-11 DIAGNOSIS — E11621 Type 2 diabetes mellitus with foot ulcer: Secondary | ICD-10-CM | POA: Insufficient documentation

## 2015-08-11 DIAGNOSIS — J189 Pneumonia, unspecified organism: Secondary | ICD-10-CM | POA: Diagnosis not present

## 2015-08-11 DIAGNOSIS — L97509 Non-pressure chronic ulcer of other part of unspecified foot with unspecified severity: Secondary | ICD-10-CM

## 2015-08-11 DIAGNOSIS — J45909 Unspecified asthma, uncomplicated: Secondary | ICD-10-CM | POA: Diagnosis not present

## 2015-08-11 DIAGNOSIS — E11319 Type 2 diabetes mellitus with unspecified diabetic retinopathy without macular edema: Secondary | ICD-10-CM | POA: Insufficient documentation

## 2015-08-11 LAB — POCT GLYCOSYLATED HEMOGLOBIN (HGB A1C): Hemoglobin A1C: 8

## 2015-08-11 MED ORDER — AMOXICILLIN-POT CLAVULANATE 875-125 MG PO TABS: 1.0000 | ORAL_TABLET | Freq: Two times a day (BID) | ORAL | Status: DC

## 2015-08-11 NOTE — Patient Instructions (Addendum)
Great to meet you!  We will work on getting you to the wound care clinic  Take augmentin, an antibiotic, for clinical pneumonia  Lets see you back in 3-4 weeks

## 2015-08-11 NOTE — Progress Notes (Signed)
   HPI  Patient presents today to establish care, and discussed that her ulcer and cough.  Cough Agents planes that he's had a cough for about one week. He's has mild dyspnea, no chest pain. He does feel slightly ill but does not have any problems tolerating fluids or fluid No chest pain  Diabetes Takes 30 units of Lantus daily, sliding scale Humalog, 6 units on average Fasting average is 140-150 Preprandial averages 150-170 6 units at 150, add 2 units per 50 mg/dL on CBG is his sliding scale. Has severe diabetic neuropathy  Diabetic foot wound On his right plantar foot he's got a 4 cm lesion that's been there for about 2 years. This is previously treated by his PCP. He just finished a course of doxycycline and Cipro for presumed infection. 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 On his left foot he has some healing wounds that it only been there for about 2 weeks No warmth, drainage, or redness concerning for infection today.  PMH: Smoking status noted Past medical, surgical, social, family history reviewed and updated in EMR ROS: Per HPI  Objective: BP 158/85 mmHg  Pulse 87  Temp(Src) 98 F (36.7 C) (Oral)  Ht 6' 3.5" (1.918 m)  Wt 278 lb 6.4 oz (126.281 kg)  BMI 34.33 kg/m2 Gen: NAD, alert, cooperative with exam HEENT: NCAT CV: RRR, good S1/S2, no murmur, TMs normal bilaterally, nares clear, oropharynx clear Resp: Mildly labored, crackles in the left lower lobe, otherwise clear with good air movement, mild scattered wheeze Abd: SNTND, BS present, no guarding or organomegaly Ext: Diabetic foot wound with muscle visible at the base measuring 4 cm x 3 cm with thick callus surrounding, his left foot has heme crusting along the lateral proximal edge, 2+ DP pulses bilaterally, sensation decreased bilaterally with monofilament to the knee on the left and up to the one third lower extremity on the right Neuro: Alert and oriented, No gross deficits  Assessment and plan:  # Foot  wound No signs of infection, long-standing Refer to wound care center  # Diabetes Good medication compliance A1c pending today No hypoglycemia Not on any statins for now- check LDL  # CAP Considering cough, shortness of breath, and lung findings I will go ahead and cover him for pneumonia. Complicated by recent doxycycline and Cipro course. Cover with Augmentin Discussed adding yogurt daily  # Depression At baseline, continue Zoloft    Orders Placed This Encounter  Procedures  . CMP14+EGFR  . CBC  . TSH + free T4  . LDL cholesterol, direct  . POCT glycosylated hemoglobin (Hb A1C)    Meds ordered this encounter  Medications  . glipiZIDE (GLUCOTROL) 10 MG tablet    Sig: Take 20 mg by mouth 2 (two) times daily.  . Cholecalciferol (VITAMIN D3) 50000 units CAPS    Sig: Take 50,000 Units by mouth once a week.  . insulin lispro (HUMALOG) 100 UNIT/ML injection    Sig: Inject into the skin 3 (three) times daily before meals.  . Multiple Vitamin (MULTIVITAMIN) capsule    Sig: Take 1 capsule by mouth daily.  Marland Kitchen amoxicillin-clavulanate (AUGMENTIN) 875-125 MG tablet    Sig: Take 1 tablet by mouth 2 (two) times daily.    Dispense:  20 tablet    Refill:  0    Laroy Apple, MD [facility] 08/11/2015, 2:15 PM

## 2015-08-12 LAB — LDL CHOLESTEROL, DIRECT: LDL Direct: 138 mg/dL — ABNORMAL HIGH (ref 0–99)

## 2015-08-12 LAB — CBC
Hematocrit: 41.5 % (ref 37.5–51.0)
Hemoglobin: 13.5 g/dL (ref 12.6–17.7)
MCH: 27.7 pg (ref 26.6–33.0)
MCHC: 32.5 g/dL (ref 31.5–35.7)
MCV: 85 fL (ref 79–97)
Platelets: 249 x10E3/uL (ref 150–379)
RBC: 4.88 x10E6/uL (ref 4.14–5.80)
RDW: 15.6 % — ABNORMAL HIGH (ref 12.3–15.4)
WBC: 7 x10E3/uL (ref 3.4–10.8)

## 2015-08-12 LAB — TSH+FREE T4
FREE T4: 0.97 ng/dL (ref 0.82–1.77)
TSH: 2.39 u[IU]/mL (ref 0.450–4.500)

## 2015-08-12 LAB — CMP14+EGFR
ALK PHOS: 51 IU/L (ref 39–117)
ALT: 48 IU/L — ABNORMAL HIGH (ref 0–44)
AST: 40 IU/L (ref 0–40)
Albumin/Globulin Ratio: 1.7 (ref 1.1–2.5)
Albumin: 4.5 g/dL (ref 3.6–4.8)
BUN/Creatinine Ratio: 19 (ref 10–22)
BUN: 23 mg/dL (ref 8–27)
Bilirubin Total: 0.3 mg/dL (ref 0.0–1.2)
CHLORIDE: 98 mmol/L (ref 96–106)
CO2: 24 mmol/L (ref 18–29)
CREATININE: 1.23 mg/dL (ref 0.76–1.27)
Calcium: 9.8 mg/dL (ref 8.6–10.2)
GFR calc Af Amer: 72 mL/min/{1.73_m2} (ref >59)
GFR calc non Af Amer: 62 mL/min/{1.73_m2} (ref >59)
GLUCOSE: 164 mg/dL — AB (ref 65–99)
Globulin, Total: 2.6 g/dL (ref 1.5–4.5)
Potassium: 4.6 mmol/L (ref 3.5–5.2)
SODIUM: 142 mmol/L (ref 134–144)
Total Protein: 7.1 g/dL (ref 6.0–8.5)

## 2015-08-15 ENCOUNTER — Telehealth: Payer: Self-pay | Admitting: *Deleted

## 2015-08-15 NOTE — Telephone Encounter (Signed)
Patient is wanting to check on referral

## 2015-08-19 NOTE — Telephone Encounter (Signed)
[facility] no longer take patients insurance and he will need a referral to another wound care center

## 2015-08-25 ENCOUNTER — Other Ambulatory Visit: Payer: Self-pay | Admitting: Family Medicine

## 2015-08-25 ENCOUNTER — Other Ambulatory Visit: Payer: Self-pay | Admitting: *Deleted

## 2015-08-25 MED ORDER — GLIPIZIDE 10 MG PO TABS: 20.0000 mg | ORAL_TABLET | Freq: Two times a day (BID) | ORAL | Status: DC

## 2015-08-25 MED ORDER — VITAMIN D3 1.25 MG (50000 UT) PO CAPS: 50000.0000 [IU] | ORAL_CAPSULE | ORAL | Status: DC

## 2015-08-25 MED ORDER — INSULIN GLARGINE 100 UNIT/ML ~~LOC~~ SOLN: 30.0000 [IU] | Freq: Every day | SUBCUTANEOUS | Status: DC

## 2015-08-25 NOTE — Telephone Encounter (Signed)
done

## 2015-08-25 NOTE — Telephone Encounter (Signed)
No labs for vit. D, so i couldn't fill

## 2015-08-26 ENCOUNTER — Other Ambulatory Visit: Payer: Self-pay | Admitting: *Deleted

## 2015-08-26 DIAGNOSIS — E1142 Type 2 diabetes mellitus with diabetic polyneuropathy: Secondary | ICD-10-CM

## 2015-08-26 DIAGNOSIS — Z794 Long term (current) use of insulin: Secondary | ICD-10-CM

## 2015-08-26 MED ORDER — INSULIN SYRINGES (DISPOSABLE) U-100 1 ML MISC: Status: DC

## 2015-09-01 ENCOUNTER — Telehealth: Payer: Self-pay | Admitting: Family Medicine

## 2015-09-01 NOTE — Telephone Encounter (Signed)
Patient is being seen by wound clinic at [facility]

## 2015-09-01 NOTE — Telephone Encounter (Signed)
Reviewing records received from his PCP previously, I do not see that he has gotten a podiatry or wound care appointment since we've seen him. He is a very severe foot wound.  I last nursing to call and schedule a follow-up appointment with me unless he has been seen by wound care physician since that time.   Laroy Apple, MD [facility] 09/01/2015, 4:41 PM

## 2015-09-04 ENCOUNTER — Telehealth: Payer: Self-pay | Admitting: Family Medicine

## 2015-09-04 MED ORDER — INSULIN ASPART 100 UNIT/ML FLEXPEN: PEN_INJECTOR | SUBCUTANEOUS | Status: DC

## 2015-09-04 MED ORDER — INSULIN PEN NEEDLE 31G X 5 MM MISC: Status: DC

## 2015-09-04 NOTE — Telephone Encounter (Signed)
Left detailed message stating new rx sent to pharmacy and to [facility] with any further questions or concerns.

## 2015-09-04 NOTE — Telephone Encounter (Signed)
Changed to novolog from [place], MD [facility] 09/04/2015, 12:08 PM

## 2015-09-08 ENCOUNTER — Ambulatory Visit (INDEPENDENT_AMBULATORY_CARE_PROVIDER_SITE_OTHER): Payer: BLUE CROSS/BLUE SHIELD | Admitting: Family Medicine

## 2015-09-08 ENCOUNTER — Encounter: Payer: Self-pay | Admitting: Family Medicine

## 2015-09-08 VITALS — BP 139/81 | HR 87 | Temp 97.0°F | Ht 75.5 in | Wt 281.4 lb

## 2015-09-08 DIAGNOSIS — E118 Type 2 diabetes mellitus with unspecified complications: Secondary | ICD-10-CM

## 2015-09-08 NOTE — Progress Notes (Signed)
   HPI  Patient presents today here for follow-up.  Diabetes Doing well with insulin, he's recently had a change from Humalog to NovoLog for formulary preference. He has good medication compliance  Healthcare maintenance Colonoscopy-patient explains that the previous GI doctor would not do a colonoscopy due to him having an openfoot wound. He's getting his foot taking care at [facility] care clinic, he's happy with their care.  PMH: Smoking status noted ROS: Per HPI  Objective: BP 139/81 mmHg  Pulse 87  Temp(Src) 97 F (36.1 C) (Oral)  Ht 6' 3.5" (1.918 m)  Wt 281 lb 6.4 oz (127.642 kg)  BMI 34.70 kg/m2 Gen: NAD, alert, cooperative with exam HEENT: NCAT CV: RRR, good S1/S2, no murmur Resp: CTABL, no wheezes, non-labored Ext: No edema, warm- clean dry intact dressing on right lower extremity Neuro: Alert and oriented, No gross deficits  Assessment and plan:  #  Diabetes, diabetic foot ulcer Following up at [facility] for wound care clinic Describes that it's getting better. Recent formulary changes, NovoLog updated and sent a few days ago. Follow-up 2 months for diabetes  Orders Placed This Encounter  Procedures  . Microalbumin / creatinine urine ratio    Meds ordered this encounter  Medications  . B-D INS SYR ULTRAFINE 1CC/30G 30G X 1/2" 1 ML MISC    Sig: USE 3 TIMES A DAY OR AS DIRECTED TO GIVE INSULIN E11.42    Refill:  [place], MD [facility] 09/08/2015, 2:22 PM

## 2015-09-08 NOTE — Patient Instructions (Signed)
Great to see you!  Come back in 2 months to discuss diabetes

## 2015-09-09 LAB — MICROALBUMIN / CREATININE URINE RATIO
CREATININE, UR: 96.3 mg/dL
MICROALB/CREAT RATIO: 4.4 mg/g creat (ref 0.0–30.0)
Microalbumin, Urine: 4.2 ug/mL

## 2015-09-19 ENCOUNTER — Ambulatory Visit (HOSPITAL_BASED_OUTPATIENT_CLINIC_OR_DEPARTMENT_OTHER): Payer: Self-pay

## 2015-11-06 ENCOUNTER — Other Ambulatory Visit: Payer: Self-pay

## 2015-11-06 MED ORDER — INSULIN GLARGINE 100 UNIT/ML ~~LOC~~ SOLN: 30.0000 [IU] | Freq: Every day | SUBCUTANEOUS | Status: DC

## 2015-11-10 ENCOUNTER — Ambulatory Visit: Payer: BLUE CROSS/BLUE SHIELD | Admitting: Family Medicine

## 2015-11-14 ENCOUNTER — Ambulatory Visit (INDEPENDENT_AMBULATORY_CARE_PROVIDER_SITE_OTHER): Payer: BLUE CROSS/BLUE SHIELD | Admitting: Family Medicine

## 2015-11-14 ENCOUNTER — Other Ambulatory Visit: Payer: Self-pay | Admitting: *Deleted

## 2015-11-14 ENCOUNTER — Encounter: Payer: Self-pay | Admitting: Family Medicine

## 2015-11-14 VITALS — BP 120/65 | HR 82 | Temp 97.7°F | Ht 75.5 in | Wt 280.6 lb

## 2015-11-14 DIAGNOSIS — E11621 Type 2 diabetes mellitus with foot ulcer: Secondary | ICD-10-CM

## 2015-11-14 DIAGNOSIS — Z794 Long term (current) use of insulin: Secondary | ICD-10-CM

## 2015-11-14 DIAGNOSIS — L97519 Non-pressure chronic ulcer of other part of right foot with unspecified severity: Secondary | ICD-10-CM

## 2015-11-14 DIAGNOSIS — E785 Hyperlipidemia, unspecified: Secondary | ICD-10-CM

## 2015-11-14 DIAGNOSIS — E1142 Type 2 diabetes mellitus with diabetic polyneuropathy: Secondary | ICD-10-CM | POA: Diagnosis not present

## 2015-11-14 DIAGNOSIS — Z0000 Encounter for general adult medical examination without abnormal findings: Secondary | ICD-10-CM | POA: Insufficient documentation

## 2015-11-14 DIAGNOSIS — L989 Disorder of the skin and subcutaneous tissue, unspecified: Secondary | ICD-10-CM | POA: Diagnosis not present

## 2015-11-14 DIAGNOSIS — E1169 Type 2 diabetes mellitus with other specified complication: Secondary | ICD-10-CM | POA: Insufficient documentation

## 2015-11-14 LAB — BAYER DCA HB A1C WAIVED: HB A1C: 8.2 % — AB (ref <7.0)

## 2015-11-14 MED ORDER — GLIPIZIDE 10 MG PO TABS: 10.0000 mg | ORAL_TABLET | Freq: Two times a day (BID) | ORAL | Status: DC

## 2015-11-14 MED ORDER — ATORVASTATIN CALCIUM 20 MG PO TABS: 20.0000 mg | ORAL_TABLET | Freq: Every day | ORAL | Status: DC

## 2015-11-14 MED ORDER — VITAMIN D3 1.25 MG (50000 UT) PO CAPS: 50000.0000 [IU] | ORAL_CAPSULE | ORAL | Status: DC

## 2015-11-14 NOTE — Patient Instructions (Addendum)
Great to see you!  Change glipizide to 1 pill twice a day Take 8 units of novolog at every meal  Start lipitor  Come back in 1 month to See Cherre Robins, our clinical pharmacist for Diabetes management, bring your blood sugar log

## 2015-11-14 NOTE — Progress Notes (Signed)
HPI  Patient presents today here today to discuss type 2 diabetes and a few other complaints.  Type 2 diabetes Good Lantus and glipizide compliance Does not take NovoLog with every meal, has difficulty describing sliding scale but states that he usually takes 8-10 units per meal. Has had 2 episodes of hypoglycemia in the early a.m. hours, as low as 40, he does have symptoms of hypoglycemia with it. Recovers easily with food and sugar sweetened beverage.  Diabetic foot wound Possible osteomyelitis Being treated aggressively by wound care in [facility]  Skin lesion Mid back skin lesion, not changing His wife has several questions about No itching, discharge, or changing  He states he's been told by GI that he cannot have a colonoscopy until his foot wound is taking care of  PMH: Smoking status noted ROS: Per HPI  Objective: BP 120/65 mmHg  Pulse 82  Temp(Src) 97.7 F (36.5 C) (Oral)  Ht 6' 3.5" (1.918 m)  Wt 280 lb 9.6 oz (127.279 kg)  BMI 34.60 kg/m2 Gen: NAD, alert, cooperative with exam HEENT: NCAT CV: RRR, good S1/S2, no murmur Resp: CTABL, no wheezes, non-labored Ext: No edema, warm Neuro: Alert and oriented, sensation decreased on the left foot as below  Diabetic Foot Exam - Simple   Simple Foot Form  Visual Inspection  See comments:  Yes  Sensation Testing  See comments:  Yes  Pulse Check  See comments:  Yes  Comments  Right foot with large bulky intact dry and clean bandage placed by wound care. Left foot with small heme crusted lesion on the dorsal part of his foot that's approximately 0.5 cm x 1 cm and heme crusted, at similar lesion on his medial left ankle that's approximately the same size and heme crusted No induration, warmth, or tenderness to palpation of either wound Sensation to monofilament is decreased completely to midcalf on the left 2+ dorsalis pedis pulse on the left     Skin: Right upper back with one large seborrheic keratosis,  approximately 1/2 cm in diameter, brown, stuck on appearance 2 lesions on mid back with light brown color irregular border, and approximately 12 mm in diameter, the other being a proximally 7 mm in diameter, located on left mid back  Assessment and plan:  # Type 2 diabetes Control slipping, A1c 8.2 Also with some hypoglycemia Decrease glipizide to 10 mg twice daily, he was on 20 mg twice daily when he established with me. Continue current dose of Lantus, 30 twice a day Change sliding scale NovoLog to 8 units at every meal, on average he was only taking it twice a day. Discussed blood sugar log including one fasting and one postprandial daily Follow-up with clinical pharmacist for further diabetes management in one month All up with me in 3 months   # Hyperlipidemia Starting Lipitor with LDL of 138 and concomitant diabetes His last A1c was slightly elevated, so I rechecked this today, recheck again in 3 months  # Healthcare maintenance He's been declined a colonoscopy by GI due to his foot wound PSA per request  # Diabetic foot ulcer Receiving good care, not unbandaged today Recommend continue wound care at the wound care center Recent improvement but now concern for osteomyelitis  Skin lesion Refer to dermatology for consideration of biopsy, the other is an obvious seborrheic keratosis   Orders Placed This Encounter  Procedures  . PSA  . CMP14+EGFR    Meds ordered this encounter  Medications  . atorvastatin (LIPITOR) 20  MG tablet    Sig: Take 1 tablet (20 mg total) by mouth daily.    Dispense:  90 tablet    Refill:  [place], MD [facility] 11/14/2015, 3:43 PM

## 2015-11-15 LAB — CMP14+EGFR
ALBUMIN: 4 g/dL (ref 3.6–4.8)
ALT: 29 IU/L (ref 0–44)
AST: 20 IU/L (ref 0–40)
Albumin/Globulin Ratio: 1.5 (ref 1.2–2.2)
Alkaline Phosphatase: 43 IU/L (ref 39–117)
BUN / CREAT RATIO: 19 (ref 10–24)
BUN: 23 mg/dL (ref 8–27)
Bilirubin Total: 0.3 mg/dL (ref 0.0–1.2)
CO2: 23 mmol/L (ref 18–29)
CREATININE: 1.23 mg/dL (ref 0.76–1.27)
Calcium: 9.8 mg/dL (ref 8.6–10.2)
Chloride: 96 mmol/L (ref 96–106)
GFR calc non Af Amer: 62 mL/min/{1.73_m2} (ref >59)
GFR, EST AFRICAN AMERICAN: 71 mL/min/{1.73_m2} (ref >59)
GLUCOSE: 273 mg/dL — AB (ref 65–99)
Globulin, Total: 2.7 g/dL (ref 1.5–4.5)
Potassium: 5 mmol/L (ref 3.5–5.2)
Sodium: 139 mmol/L (ref 134–144)
TOTAL PROTEIN: 6.7 g/dL (ref 6.0–8.5)

## 2015-11-15 LAB — PSA: Prostate Specific Ag, Serum: 0.3 ng/mL (ref 0.0–4.0)

## 2015-12-09 ENCOUNTER — Other Ambulatory Visit (HOSPITAL_COMMUNITY): Payer: Self-pay | Admitting: *Deleted

## 2015-12-09 ENCOUNTER — Other Ambulatory Visit (HOSPITAL_COMMUNITY): Payer: Self-pay | Admitting: Family

## 2015-12-11 MED ORDER — CEFAZOLIN SODIUM 10 G IJ SOLR
3.0000 g | INTRAMUSCULAR | Status: AC
  Administered 2015-12-12: 3 g via INTRAVENOUS
  Filled 2015-12-11: qty 3000

## 2015-12-11 NOTE — Progress Notes (Signed)
Several unsuccessful attempts were made to contact pt. Pt phone not in service when call placed at 5:18P.M.;lvm with Malachy Mood, Surgical Scheduler for MD, to get and alternate contact number.

## 2015-12-12 ENCOUNTER — Encounter (HOSPITAL_COMMUNITY): Payer: Self-pay | Admitting: *Deleted

## 2015-12-12 ENCOUNTER — Ambulatory Visit (HOSPITAL_COMMUNITY): Payer: BLUE CROSS/BLUE SHIELD | Admitting: Anesthesiology

## 2015-12-12 ENCOUNTER — Encounter (HOSPITAL_COMMUNITY)
Admission: RE | Disposition: A | Discharge status: Home / Self Care | Payer: Self-pay | Source: Ambulatory Visit | Attending: Orthopedic Surgery

## 2015-12-12 ENCOUNTER — Ambulatory Visit (HOSPITAL_COMMUNITY)
Admission: RE | Admit: 2015-12-12 | Discharge: 2015-12-13 | Disposition: A | Discharge status: Home / Self Care | Payer: BLUE CROSS/BLUE SHIELD | Source: Ambulatory Visit | Attending: Orthopedic Surgery | Admitting: Orthopedic Surgery

## 2015-12-12 DIAGNOSIS — Z6835 Body mass index (BMI) 35.0-35.9, adult: Secondary | ICD-10-CM | POA: Insufficient documentation

## 2015-12-12 DIAGNOSIS — E1169 Type 2 diabetes mellitus with other specified complication: Secondary | ICD-10-CM | POA: Diagnosis not present

## 2015-12-12 DIAGNOSIS — E11621 Type 2 diabetes mellitus with foot ulcer: Secondary | ICD-10-CM | POA: Diagnosis not present

## 2015-12-12 DIAGNOSIS — Z79899 Other long term (current) drug therapy: Secondary | ICD-10-CM | POA: Insufficient documentation

## 2015-12-12 DIAGNOSIS — F329 Major depressive disorder, single episode, unspecified: Secondary | ICD-10-CM | POA: Diagnosis not present

## 2015-12-12 DIAGNOSIS — Z89511 Acquired absence of right leg below knee: Secondary | ICD-10-CM

## 2015-12-12 DIAGNOSIS — M869 Osteomyelitis, unspecified: Secondary | ICD-10-CM | POA: Insufficient documentation

## 2015-12-12 DIAGNOSIS — E785 Hyperlipidemia, unspecified: Secondary | ICD-10-CM | POA: Diagnosis not present

## 2015-12-12 DIAGNOSIS — Z794 Long term (current) use of insulin: Secondary | ICD-10-CM | POA: Insufficient documentation

## 2015-12-12 DIAGNOSIS — L97519 Non-pressure chronic ulcer of other part of right foot with unspecified severity: Secondary | ICD-10-CM | POA: Diagnosis not present

## 2015-12-12 DIAGNOSIS — E1140 Type 2 diabetes mellitus with diabetic neuropathy, unspecified: Secondary | ICD-10-CM | POA: Insufficient documentation

## 2015-12-12 HISTORY — PX: AMPUTATION: SHX166

## 2015-12-12 LAB — CBC
HEMATOCRIT: 40.4 % (ref 39.0–52.0)
HEMOGLOBIN: 12.9 g/dL — AB (ref 13.0–17.0)
MCH: 27.6 pg (ref 26.0–34.0)
MCHC: 31.9 g/dL (ref 30.0–36.0)
MCV: 86.3 fL (ref 78.0–100.0)
Platelets: 236 10*3/uL (ref 150–400)
RBC: 4.68 MIL/uL (ref 4.22–5.81)
RDW: 14.4 % (ref 11.5–15.5)
WBC: 8.8 10*3/uL (ref 4.0–10.5)

## 2015-12-12 LAB — BASIC METABOLIC PANEL
ANION GAP: 14 (ref 5–15)
BUN: 29 mg/dL — ABNORMAL HIGH (ref 6–20)
CHLORIDE: 102 mmol/L (ref 101–111)
CO2: 23 mmol/L (ref 22–32)
Calcium: 9.5 mg/dL (ref 8.9–10.3)
Creatinine, Ser: 1.37 mg/dL — ABNORMAL HIGH (ref 0.61–1.24)
GFR calc non Af Amer: 53 mL/min — ABNORMAL LOW (ref >60)
GLUCOSE: 241 mg/dL — AB (ref 65–99)
POTASSIUM: 4.4 mmol/L (ref 3.5–5.1)
Sodium: 139 mmol/L (ref 135–145)

## 2015-12-12 LAB — GLUCOSE, CAPILLARY
GLUCOSE-CAPILLARY: 272 mg/dL — AB (ref 65–99)
GLUCOSE-CAPILLARY: 229 mg/dL — AB (ref 65–99)
GLUCOSE-CAPILLARY: 191 mg/dL — AB (ref 65–99)
Glucose-Capillary: 211 mg/dL — ABNORMAL HIGH (ref 65–99)
Glucose-Capillary: 169 mg/dL — ABNORMAL HIGH (ref 65–99)

## 2015-12-12 SURGERY — AMPUTATION, FOOT, PARTIAL
Anesthesia: Monitor Anesthesia Care | Site: Foot | Laterality: Right

## 2015-12-12 MED ORDER — FENTANYL CITRATE (PF) 100 MCG/2ML IJ SOLN
INTRAMUSCULAR | Status: DC | PRN
  Administered 2015-12-12 (×2): 100 ug via INTRAVENOUS

## 2015-12-12 MED ORDER — LACTATED RINGERS IV SOLN: INTRAVENOUS | Status: DC

## 2015-12-12 MED ORDER — INSULIN ASPART 100 UNIT/ML ~~LOC~~ SOLN
0.0000 [IU] | Freq: Three times a day (TID) | SUBCUTANEOUS | Status: DC
  Administered 2015-12-12: 3 [IU] via SUBCUTANEOUS
  Administered 2015-12-13: 2 [IU] via SUBCUTANEOUS

## 2015-12-12 MED ORDER — GLIPIZIDE 5 MG PO TABS
10.0000 mg | ORAL_TABLET | Freq: Two times a day (BID) | ORAL | Status: DC
  Administered 2015-12-12 – 2015-12-13 (×3): 10 mg via ORAL
  Filled 2015-12-12 (×3): qty 2

## 2015-12-12 MED ORDER — LACTATED RINGERS IV SOLN
INTRAVENOUS | Status: DC | PRN
  Administered 2015-12-12: 12:00:00 via INTRAVENOUS

## 2015-12-12 MED ORDER — ACETAMINOPHEN 325 MG PO TABS: 650.0000 mg | ORAL_TABLET | Freq: Four times a day (QID) | ORAL | Status: DC | PRN

## 2015-12-12 MED ORDER — PROPOFOL 500 MG/50ML IV EMUL
INTRAVENOUS | Status: DC | PRN
  Administered 2015-12-12: 50 ug/kg/min via INTRAVENOUS

## 2015-12-12 MED ORDER — MIDAZOLAM HCL 2 MG/2ML IJ SOLN
INTRAMUSCULAR | Status: AC
  Filled 2015-12-12: qty 2

## 2015-12-12 MED ORDER — PROPOFOL 10 MG/ML IV BOLUS
INTRAVENOUS | Status: AC
Start: 2015-12-12 — End: 2015-12-12
  Filled 2015-12-12: qty 20

## 2015-12-12 MED ORDER — ASPIRIN EC 325 MG PO TBEC
325.0000 mg | DELAYED_RELEASE_TABLET | Freq: Every day | ORAL | Status: DC
  Administered 2015-12-12 – 2015-12-13 (×2): 325 mg via ORAL
  Filled 2015-12-12 (×2): qty 1

## 2015-12-12 MED ORDER — SERTRALINE HCL 50 MG PO TABS
50.0000 mg | ORAL_TABLET | Freq: Every day | ORAL | Status: DC
  Administered 2015-12-12 – 2015-12-13 (×2): 50 mg via ORAL
  Filled 2015-12-12 (×2): qty 1

## 2015-12-12 MED ORDER — ONDANSETRON HCL 4 MG/2ML IJ SOLN: 4.0000 mg | Freq: Four times a day (QID) | INTRAMUSCULAR | Status: DC | PRN

## 2015-12-12 MED ORDER — LIDOCAINE-EPINEPHRINE (PF) 1.5 %-1:200000 IJ SOLN
INTRAMUSCULAR | Status: DC | PRN
  Administered 2015-12-12: 10 mL via PERINEURAL

## 2015-12-12 MED ORDER — INSULIN GLARGINE 100 UNIT/ML ~~LOC~~ SOLN
15.0000 [IU] | Freq: Every day | SUBCUTANEOUS | Status: DC
  Administered 2015-12-12: 15 [IU] via SUBCUTANEOUS
  Filled 2015-12-12 (×2): qty 0.15

## 2015-12-12 MED ORDER — HYDROMORPHONE HCL 1 MG/ML IJ SOLN: 1.0000 mg | INTRAMUSCULAR | Status: DC | PRN

## 2015-12-12 MED ORDER — FENTANYL CITRATE (PF) 250 MCG/5ML IJ SOLN
INTRAMUSCULAR | Status: AC
Start: 2015-12-12 — End: 2015-12-12
  Filled 2015-12-12: qty 5

## 2015-12-12 MED ORDER — METHOCARBAMOL 1000 MG/10ML IJ SOLN
500.0000 mg | Freq: Four times a day (QID) | INTRAVENOUS | Status: DC | PRN
  Filled 2015-12-12: qty 5

## 2015-12-12 MED ORDER — SODIUM CHLORIDE 0.9 % IV SOLN
INTRAVENOUS | Status: DC | PRN
  Administered 2015-12-12: 13:00:00 via INTRAVENOUS

## 2015-12-12 MED ORDER — CHLORHEXIDINE GLUCONATE 4 % EX LIQD: 60.0000 mL | Freq: Once | CUTANEOUS | Status: DC

## 2015-12-12 MED ORDER — LIDOCAINE HCL (CARDIAC) 20 MG/ML IV SOLN
INTRAVENOUS | Status: DC | PRN
  Administered 2015-12-12: 60 mg via INTRAVENOUS

## 2015-12-12 MED ORDER — PROPOFOL 10 MG/ML IV BOLUS
INTRAVENOUS | Status: AC
  Filled 2015-12-12: qty 20

## 2015-12-12 MED ORDER — OXYCODONE HCL 5 MG PO TABS: 5.0000 mg | ORAL_TABLET | ORAL | Status: DC | PRN

## 2015-12-12 MED ORDER — ETOMIDATE 2 MG/ML IV SOLN
INTRAVENOUS | Status: AC
  Filled 2015-12-12: qty 10

## 2015-12-12 MED ORDER — ACETAMINOPHEN 650 MG RE SUPP: 650.0000 mg | Freq: Four times a day (QID) | RECTAL | Status: DC | PRN

## 2015-12-12 MED ORDER — ACETAMINOPHEN 160 MG/5ML PO SOLN
325.0000 mg | ORAL | Status: DC | PRN
  Filled 2015-12-12: qty 20.3

## 2015-12-12 MED ORDER — MIDAZOLAM HCL 5 MG/5ML IJ SOLN
INTRAMUSCULAR | Status: DC | PRN
  Administered 2015-12-12: 2 mg via INTRAVENOUS

## 2015-12-12 MED ORDER — ONDANSETRON HCL 4 MG PO TABS: 4.0000 mg | ORAL_TABLET | Freq: Four times a day (QID) | ORAL | Status: DC | PRN

## 2015-12-12 MED ORDER — OXYCODONE HCL 5 MG/5ML PO SOLN: 5.0000 mg | Freq: Once | ORAL | Status: DC | PRN

## 2015-12-12 MED ORDER — OXYCODONE HCL 5 MG PO TABS: 5.0000 mg | ORAL_TABLET | Freq: Once | ORAL | Status: DC | PRN

## 2015-12-12 MED ORDER — ATORVASTATIN CALCIUM 20 MG PO TABS
20.0000 mg | ORAL_TABLET | Freq: Every day | ORAL | Status: DC
  Administered 2015-12-12 – 2015-12-13 (×2): 20 mg via ORAL
  Filled 2015-12-12 (×2): qty 1

## 2015-12-12 MED ORDER — SODIUM CHLORIDE 0.9 % IV SOLN
INTRAVENOUS | Status: DC
  Administered 2015-12-12: 16:00:00 via INTRAVENOUS

## 2015-12-12 MED ORDER — ACETAMINOPHEN 325 MG PO TABS: 325.0000 mg | ORAL_TABLET | ORAL | Status: DC | PRN

## 2015-12-12 MED ORDER — PHENYLEPHRINE 40 MCG/ML (10ML) SYRINGE FOR IV PUSH (FOR BLOOD PRESSURE SUPPORT)
PREFILLED_SYRINGE | INTRAVENOUS | Status: AC
  Filled 2015-12-12: qty 10

## 2015-12-12 MED ORDER — BUPIVACAINE-EPINEPHRINE (PF) 0.5% -1:200000 IJ SOLN
INTRAMUSCULAR | Status: DC | PRN
  Administered 2015-12-12: 30 mL via PERINEURAL

## 2015-12-12 MED ORDER — METOCLOPRAMIDE HCL 5 MG/ML IJ SOLN: 5.0000 mg | Freq: Three times a day (TID) | INTRAMUSCULAR | Status: DC | PRN

## 2015-12-12 MED ORDER — 0.9 % SODIUM CHLORIDE (POUR BTL) OPTIME
TOPICAL | Status: DC | PRN
  Administered 2015-12-12: 1000 mL

## 2015-12-12 MED ORDER — METOCLOPRAMIDE HCL 5 MG PO TABS: 5.0000 mg | ORAL_TABLET | Freq: Three times a day (TID) | ORAL | Status: DC | PRN

## 2015-12-12 MED ORDER — INSULIN ASPART 100 UNIT/ML ~~LOC~~ SOLN
4.0000 [IU] | Freq: Three times a day (TID) | SUBCUTANEOUS | Status: DC
  Administered 2015-12-12 – 2015-12-13 (×3): 4 [IU] via SUBCUTANEOUS

## 2015-12-12 MED ORDER — CEFAZOLIN SODIUM 1-5 GM-% IV SOLN
1.0000 g | Freq: Four times a day (QID) | INTRAVENOUS | Status: AC
  Administered 2015-12-12 – 2015-12-13 (×3): 1 g via INTRAVENOUS
  Filled 2015-12-12 (×3): qty 50

## 2015-12-12 MED ORDER — METHOCARBAMOL 500 MG PO TABS: 500.0000 mg | ORAL_TABLET | Freq: Four times a day (QID) | ORAL | Status: DC | PRN

## 2015-12-12 MED ORDER — FENTANYL CITRATE (PF) 100 MCG/2ML IJ SOLN
INTRAMUSCULAR | Status: AC
  Filled 2015-12-12: qty 2

## 2015-12-12 MED ORDER — FENTANYL CITRATE (PF) 100 MCG/2ML IJ SOLN: 25.0000 ug | INTRAMUSCULAR | Status: DC | PRN

## 2015-12-12 SURGICAL SUPPLY — 29 items
BLADE SAW SGTL HD 18.5X60.5X1. (BLADE) ×2 IMPLANT
BLADE SURG 10 STRL SS (BLADE) IMPLANT
BNDG COHESIVE 4X5 TAN STRL (GAUZE/BANDAGES/DRESSINGS) ×4 IMPLANT
BNDG GAUZE ELAST 4 BULKY (GAUZE/BANDAGES/DRESSINGS) IMPLANT
COVER SURGICAL LIGHT HANDLE (MISCELLANEOUS) ×4 IMPLANT
DRAPE U-SHAPE 47X51 STRL (DRAPES) ×2 IMPLANT
DRSG ADAPTIC 3X8 NADH LF (GAUZE/BANDAGES/DRESSINGS) ×2 IMPLANT
DRSG PAD ABDOMINAL 8X10 ST (GAUZE/BANDAGES/DRESSINGS) ×2 IMPLANT
DURAPREP 26ML APPLICATOR (WOUND CARE) ×2 IMPLANT
ELECT REM PT RETURN 9FT ADLT (ELECTROSURGICAL) ×2
ELECTRODE REM PT RTRN 9FT ADLT (ELECTROSURGICAL) ×1 IMPLANT
GAUZE SPONGE 4X4 12PLY STRL (GAUZE/BANDAGES/DRESSINGS) IMPLANT
GLOVE BIOGEL PI IND STRL 9 (GLOVE) ×1 IMPLANT
GLOVE BIOGEL PI INDICATOR 9 (GLOVE) ×1
GLOVE SURG ORTHO 9.0 STRL STRW (GLOVE) ×4 IMPLANT
GOWN STRL REUS W/ TWL XL LVL3 (GOWN DISPOSABLE) ×3 IMPLANT
GOWN STRL REUS W/TWL XL LVL3 (GOWN DISPOSABLE) ×3
KIT BASIN OR (CUSTOM PROCEDURE TRAY) ×2 IMPLANT
KIT PREVENA INCISION MGT 13 (CANNISTER) ×2 IMPLANT
KIT ROOM TURNOVER OR (KITS) ×2 IMPLANT
NS IRRIG 1000ML POUR BTL (IV SOLUTION) ×2 IMPLANT
PACK ORTHO EXTREMITY (CUSTOM PROCEDURE TRAY) ×2 IMPLANT
PAD ARMBOARD 7.5X6 YLW CONV (MISCELLANEOUS) ×4 IMPLANT
SPONGE LAP 18X18 X RAY DECT (DISPOSABLE) ×2 IMPLANT
SUT ETHILON 2 0 PSLX (SUTURE) ×4 IMPLANT
SUT VIC AB 2-0 CTB1 (SUTURE) ×4 IMPLANT
TOWEL OR 17X24 6PK STRL BLUE (TOWEL DISPOSABLE) ×2 IMPLANT
TOWEL OR 17X26 10 PK STRL BLUE (TOWEL DISPOSABLE) ×2 IMPLANT
WATER STERILE IRR 1000ML POUR (IV SOLUTION) IMPLANT

## 2015-12-12 NOTE — Progress Notes (Signed)
Fentanyl 121mcg that was removed from Pixis by Leory Plowman was given to Herbert Deaner CRNA to use for nerve block.

## 2015-12-12 NOTE — H&P (Signed)
Rick Lane is an 65 y.o. male.   Chief Complaint: Diabetic insensate neuropathy with osteomyelitis and ulceration right forefoot HPI: Patient is a 65 year old gentleman who is undergone foot salvage intervention with progressive ulceration and osteomyelitis of the right forefoot.  Past Medical History  Diagnosis Date  . Depression   . Diabetes mellitus without complication ([place])   . Asthma   . Cataract   . Hyperlipidemia     Past Surgical History  Procedure Laterality Date  . Eye surgery      left eye  . Toe removal Right 2012,2014    Family History  Problem Relation Age of Onset  . Heart disease Father   . Diabetes Sister   . Diabetes Brother    Social History:  reports that he has never smoked. He does not have any smokeless tobacco history on file. He reports that he does not drink alcohol or use illicit drugs.  Allergies:  Allergies  Allergen Reactions  . Bactroban [Mupirocin Calcium]   . Gentamicin     No prescriptions prior to admission    No results found for this or any previous visit (from the past 48 hour(s)). No results found.  Review of Systems  All other systems reviewed and are negative.   There were no vitals taken for this visit. Physical Exam  On examination patient has a palpable pulse he does not have protective sensation he has ulceration osteomyelitis of the right forefoot Assessment/Plan Assessment: Diabetic insensate neuropathy with ulceration osteomyelitis right forefoot.  Plan: We'll plan for right transmetatarsal amputation risks and benefits were discussed including risk of the wound not healing. Patient states he understands wishes to proceed at this time.  Rick Minion, MD 12/12/2015, 6:36 AM

## 2015-12-12 NOTE — Progress Notes (Signed)
Orthopedic Tech Progress Note Patient Details:  Rick Lane 1950/11/13 [id number] Brace order completed by bio-tech vendor. Patient ID: Rick Lane, male   DOB: Jul 09, 1951, 64 y.o.   MRN: [id number]   Rick Lane 12/12/2015, 7:36 PM

## 2015-12-12 NOTE — Op Note (Signed)
12/12/2015  12:51 PM  PATIENT:  Rick Lane    PRE-OPERATIVE DIAGNOSIS:  Osteomyelitis Right Foot  POST-OPERATIVE DIAGNOSIS:  Same  PROCEDURE:  Right Transmetatarsal Amputation  SURGEON:  [name], MD  PHYSICIAN ASSISTANT:None ANESTHESIA:   General  PREOPERATIVE INDICATIONS:  Rick Lane is a  65 y.o. male with a diagnosis of Osteomyelitis Right Foot who failed conservative measures and elected for surgical management.    The risks benefits and alternatives were discussed with the patient preoperatively including but not limited to the risks of infection, bleeding, nerve injury, cardiopulmonary complications, the need for revision surgery, among others, and the patient was willing to proceed.  OPERATIVE I  MPLANTS: Prevena wound VAC  OPERATIVE FINDINGS: Calcified vessels with good petechial bleeding no abscess  OPERATIVE PROCEDURE: Patient was brought to the operating room and underwent a regional anesthetic. After adequate levels anesthesia obtained patient's right lower extremity was prepped using DuraPrep draped into a sterile field. A timeout was called. A fishmouth incision was made just proximal to the ulcers of the base of the fifth metatarsal. This was carried sharply down to bone. A transmetatarsal amputation was performed to the base of the metatarsals with this beveled dorsally and plantarly. Electrocautery was used for hemostasis. The wound was irrigated with normal saline. The skin was closed using 2-0 nylon. A Prevena wound VAC was applied this had a good suction fit patient was then taken the PACU in stable condition.

## 2015-12-12 NOTE — Anesthesia Preprocedure Evaluation (Signed)
Anesthesia Evaluation  Patient identified by MRN, date of birth, ID band Patient awake    Airway Mallampati: II  TM Distance: >3 FB Neck ROM: Full    Dental  (+) Dental Advisory Given, Poor Dentition   Pulmonary shortness of breath,    breath sounds clear to auscultation       Cardiovascular negative cardio ROS   Rhythm:Regular     Neuro/Psych PSYCHIATRIC DISORDERS Depression negative neurological ROS     GI/Hepatic negative GI ROS, Neg liver ROS,   Endo/Other  diabetes, Type 2, Insulin DependentMorbid obesity  Renal/GU Renal InsufficiencyRenal disease     Musculoskeletal   Abdominal   Peds  Hematology   Anesthesia Other Findings   Reproductive/Obstetrics                             Anesthesia Physical Anesthesia Plan  ASA: III  Anesthesia Plan: MAC and Regional   Post-op Pain Management:    Induction: Intravenous  Airway Management Planned: Natural Airway, Nasal Cannula and Simple Face Mask  Additional Equipment: None  Intra-op Plan:   Post-operative Plan:   Informed Consent: I have reviewed the patients History and Physical, chart, labs and discussed the procedure including the risks, benefits and alternatives for the proposed anesthesia with the patient or authorized representative who has indicated his/her understanding and acceptance.   Dental advisory given  Plan Discussed with: CRNA and Surgeon  Anesthesia Plan Comments:         Anesthesia Quick Evaluation

## 2015-12-12 NOTE — Transfer of Care (Signed)
Immediate Anesthesia Transfer of Care Note  Patient: Rick Lane  Procedure(s) Performed: Procedure(s): Right Transmetatarsal Amputation With Prevena VAC Placement (Right)  Patient Location: PACU  Anesthesia Type:MAC and Regional  Level of Consciousness: awake, alert , oriented and patient cooperative  Airway & Oxygen Therapy: Patient Spontanous Breathing and Patient connected to nasal cannula oxygen  Post-op Assessment: Report given to RN, Post -op Vital signs reviewed and stable and Patient moving all extremities  Post vital signs: Reviewed and stable  Last Vitals:  Filed Vitals:   12/12/15 1140 12/12/15 1305  BP: 135/57 100/51  Pulse: 85 80  Temp:  36.6 C  Resp: 18 12    Last Pain: There were no vitals filed for this visit.       Complications: No apparent anesthesia complications

## 2015-12-12 NOTE — Anesthesia Procedure Notes (Addendum)
Procedure Name: MAC Performed by: Izora Gala Oxygen Delivery Method: Nasal cannula Placement Confirmation: positive ETCO2   Anesthesia Regional Block:  Popliteal block  Pre-Anesthetic Checklist: ,, timeout performed, Correct Patient, Correct Site, Correct Laterality, Correct Procedure, Correct Position, site marked, Risks and benefits discussed, Surgical consent,  Pre-op evaluation,  At surgeon's request  Laterality: Lower and Right  Prep: chloraprep       Needles:  Injection technique: Single-shot  Needle Type: Echogenic Stimulator Needle          Additional Needles:  Procedures: ultrasound guided (picture in chart) and nerve stimulator Popliteal block  Nerve Stimulator or Paresthesia:  Response: plantar, 0.5 mA,   Additional Responses:   Narrative:  Injection made incrementally with aspirations every 5 mL.  Performed by: Personally  Anesthesiologist: Meara Wiechman  Additional Notes: H+P and labs reviewed, risks and benefits discussed with patient, procedure tolerated well without complications

## 2015-12-13 DIAGNOSIS — E1169 Type 2 diabetes mellitus with other specified complication: Secondary | ICD-10-CM | POA: Diagnosis not present

## 2015-12-13 LAB — GLUCOSE, CAPILLARY: Glucose-Capillary: 124 mg/dL — ABNORMAL HIGH (ref 65–99)

## 2015-12-13 NOTE — Care Management Note (Signed)
Case Management Note  Patient Details  Name: Rick Lane MRN: [id number] Date of Birth: [birth year]  Subjective/Objective:   65 yr old male admitted with osteomyelitis of right foot,  failed conservative measures and elected for surgical management,s/p right transmetatarsal amputation.   Action/Plan: Case manager spoke with patient concerning discharge needs and DME. Patient has rolling walker. CM has ordered 3in1 Choice was offered for Home health agencies. Referral was called to [place], [place] Liaison. Patient will have family assistance at discharge.   Expected Discharge Date:    12/13/15              Expected Discharge Plan:  [place]  In-House Referral:  NA  Discharge planning Services  CM Consult  Post Acute Care Choice:  Durable Medical Equipment Choice offered to:  Patient  DME Arranged:  3-N-1 DME Agency:  [place]:  PT [facility] Agency:  [place]  Status of Service:  Completed, signed off  Medicare Important Message Given:    Date Medicare IM Given:    Medicare IM give by:    Date Additional Medicare IM Given:    Additional Medicare Important Message give by:     If discussed at [place] of Stay Meetings, dates discussed:    Additional Comments:  Ninfa Meeker, RN 12/13/2015, 10:50 AM

## 2015-12-13 NOTE — Evaluation (Signed)
Physical Therapy Evaluation Patient Details Name: Rick Lane MRN: [id number] DOB: Oct 17, 1950 Today's Date: 12/13/2015   History of Present Illness  A 65 y.o. male with a diagnosis of Osteomyelitis Right Foot who failed conservative measures and elected for surgical management. Pt to OR for right transmetatarsal amputation  Clinical Impression  Nice guy but he has hard time with TDWB as fatiques easily and neuropathy affects his balance.  Pt lives with wife.  He has RW at home - recommending BSC to help with less WB with toilet transfers.  Talked about putting chairs aruond the house and when pt tired and cant do TDWB then to sit and rest until he can resume.  Pt with VAC in place.  Recommending HHPT to help with home safety and continued education with TDWB to promote good wound healing.  No steps for pt to get in his home.  Pt planning for DC today from hospital    Follow Up Recommendations Home health PT    Equipment Recommendations  3in1 (PT)    Recommendations for Other Services       Precautions / Restrictions Precautions Precautions: Fall Restrictions Weight Bearing Restrictions: Yes RLE Weight Bearing: Touchdown weight bearing Other Position/Activity Restrictions: Educated pt on not ever crossing his legs during healing and pt to keep foot elevated when not up on it.  pt to wear boot only when walking.      Mobility  Bed Mobility Overal bed mobility: Independent                Transfers Overall transfer level: Modified independent               General transfer comment: reminded pt to kick right leg out when stnading up and sitting down  Ambulation/Gait Ambulation/Gait assistance: Min guard Ambulation Distance (Feet): 25 Feet Assistive device: Rolling walker (2 wheeled) Gait Pattern/deviations: Step-to pattern        Stairs Stairs:  (pt has no steps but i gave verbal demonstration of going up and down at curb with TDWB)          Wheelchair  Mobility    Modified Rankin (Stroke Patients Only)       Balance                                             Pertinent Vitals/Pain Pain Assessment: No/denies pain (due to neuropathy.)    Home Living Family/patient expects to be discharged to:: Private residence Living Arrangements: Spouse/significant other Available Help at Discharge: Family;Friend(s) Type of Home: House Home Access: Ramped entrance     Home Layout: One level Home Equipment: Environmental consultant - 2 wheels;Cane - single point Additional Comments: pt siad he does OK getting up and down from toilet.  I recommended BSC but pt didnt think he needs one.  I educated pt on tub transfer bench - he said they used to have one but it broke    Prior Function Level of Independence: Independent with assistive device(s)         Comments: pt denies falls since 2012     Hand Dominance        Extremity/Trunk Assessment   Upper Extremity Assessment: Overall WFL for tasks assessed           Lower Extremity Assessment: Overall WFL for tasks assessed      Cervical / Trunk Assessment: Normal  Communication   Communication: No difficulties  Cognition Arousal/Alertness: Awake/alert Behavior During Therapy: WFL for tasks assessed/performed                        General Comments General comments (skin integrity, edema, etc.): pt stood with RW - trying to get him to put less wegiht through right leg - harder due to his neuropathy    Exercises        Assessment/Plan    PT Assessment All further PT needs can be met in the next venue of care  PT Diagnosis Difficulty walking;Generalized weakness   PT Problem List Decreased strength;Decreased range of motion;Decreased activity tolerance;Decreased balance;Decreased safety awareness;Decreased knowledge of precautions  PT Treatment Interventions     PT Goals (Current goals can be found in the Care Plan section) Acute Rehab PT Goals Patient Stated  Goal: to get home safely PT Goal Formulation: With patient Potential to Achieve Goals: Good    Frequency     Barriers to discharge        Co-evaluation               End of Session Equipment Utilized During Treatment: Gait belt (right CAM Boot) Activity Tolerance: Patient tolerated treatment well;Patient limited by fatigue Patient left: in chair;with call bell/phone within reach (with right leg elevated) Nurse Communication: Mobility status         Time: 0940-1010 PT Time Calculation (min) (ACUTE ONLY): 30 min   Charges:   PT Evaluation $PT Eval Low Complexity: 1 Procedure PT Treatments $Gait Training: 8-22 mins   PT G Codes:        Loyal Buba 12/13/2015, 10:23 AM 12/13/2015   Rande Lawman, PT

## 2015-12-13 NOTE — Progress Notes (Signed)
Discharge instructions provided to patient.  No prescription at discharge, confirmed with Dr. Sharol Given.  Cam walker boot on at time of discharge; IV removed.  Wife present.  Prevena home vac on patient at time of discharge.  No questions at time of discharge.  3-in-1 bedside commode delivered to room. Escorted via wheelchair by NT, wife to drive home.  Home health PT arrangements made with [facility] prior to discharge by CM.

## 2015-12-13 NOTE — Anesthesia Postprocedure Evaluation (Signed)
Anesthesia Post Note  Patient: Rick Lane  Procedure(s) Performed: Procedure(s) (LRB): Right Transmetatarsal Amputation With Prevena VAC Placement (Right)  Patient location during evaluation: PACU Anesthesia Type: MAC and Regional Level of consciousness: awake Pain management: pain level controlled Vital Signs Assessment: post-procedure vital signs reviewed and stable Respiratory status: spontaneous breathing Cardiovascular status: stable Postop Assessment: no signs of nausea or vomiting Anesthetic complications: no    Last Vitals:  Filed Vitals:   12/13/15 0009 12/13/15 0434  BP: 121/56 113/59  Pulse: 85 81  Temp: 36.9 C 36.8 C  Resp: 16 18    Last Pain:  Filed Vitals:   12/13/15 1159  PainSc: 0-No pain                 Tino Ronan

## 2015-12-13 NOTE — Discharge Summary (Signed)
Physician Discharge Summary  Patient ID: Rick Lane MRN: [id number] DOB/AGE: 64-Dec-1952 64 y.o.  Admit date: 12/12/2015 Discharge date: 12/13/2015  Admission Diagnoses:Osteomyelitis ulceration right forefoot  Discharge Diagnoses:  Active Problems:   S/P transmetatarsal amputation of foot [facility])   Discharged Condition: stable  Hospital Course: Patient's hospital course essentially unremarkable. He underwent a transmetatarsal amputation the left. Postoperatively he filled with a 45 mL canister this was replaced with a new canister. Patient felt comfortable he was moving his ankle well he had no pain. Patient was discharged to home in stable condition.  Consults: None  Significant Diagnostic Studies: labs: Routine labs  Treatments: surgery: See operative note  Discharge Exam: Blood pressure 113/59, pulse 81, temperature 98.2 F (36.8 C), temperature source Oral, resp. rate 18, height 6\' 2"  (1.88 m), weight 127.007 kg (280 lb), SpO2 95 %. Incision/Wound: VAC dressing clean and dry functioning well  Disposition: 01-Home or Self Care     Medication List    ASK your doctor about these medications        atorvastatin 20 MG tablet  Commonly known as:  LIPITOR  Take 1 tablet (20 mg total) by mouth daily.     B-D INS SYR ULTRAFINE 1CC/30G 30G X 1/2" 1 ML Misc  Generic drug:  Insulin Syringe-Needle U-100  USE 3 TIMES A DAY OR AS DIRECTED TO GIVE INSULIN E11.42     glipiZIDE 10 MG tablet  Commonly known as:  GLUCOTROL  Take 1 tablet (10 mg total) by mouth 2 (two) times daily.     insulin aspart 100 UNIT/ML FlexPen  Commonly known as:  NOVOLOG FLEXPEN  6 to 12 units three times daily with meals     insulin glargine 100 UNIT/ML injection  Commonly known as:  LANTUS  Inject 0.3 mLs (30 Units total) into the skin daily.     Insulin Pen Needle 31G X 5 MM Misc  Use three times daily     Insulin Syringes (Disposable) U-100 1 ML Misc  Use 3 times a day or as directed to give  Insulin     multivitamin capsule  Take 1 capsule by mouth daily.     sertraline 50 MG tablet  Commonly known as:  ZOLOFT  Take 50 mg by mouth daily.     Vitamin D3 50000 units Caps  Take 50,000 Units by mouth once a week.           Follow-up Information    Follow up with DUDA,MARCUS V, MD In 1 week.   Specialty:  Orthopedic Surgery   Contact information:   [place] [address] [phone]       Signed: [name] 12/13/2015, 7:54 AM

## 2015-12-15 ENCOUNTER — Ambulatory Visit: Payer: BLUE CROSS/BLUE SHIELD | Admitting: Pharmacist

## 2015-12-16 ENCOUNTER — Encounter (HOSPITAL_COMMUNITY): Payer: Self-pay | Admitting: Orthopedic Surgery

## 2015-12-16 NOTE — Progress Notes (Signed)
Late entry for missed G-code. Based on review of the evaluation and goals by Rande Lawman, PT    12-24-15 1024  PT G-Codes **NOT FOR INPATIENT CLASS**  Functional Assessment Tool Used clinical judgement  Functional Limitation Mobility: Walking and moving around  Mobility: Walking and Moving Around Current Status [id number]) CJ  Mobility: Walking and Moving Around Goal Status [phone]) CI  Lavonia Dana, [place]  [phone] 12/16/2015

## 2015-12-26 ENCOUNTER — Telehealth: Payer: Self-pay | Admitting: Family Medicine

## 2015-12-26 MED ORDER — INSULIN GLARGINE 100 UNIT/ML ~~LOC~~ SOLN: 30.0000 [IU] | Freq: Two times a day (BID) | SUBCUTANEOUS | Status: DC

## 2015-12-26 NOTE — Telephone Encounter (Signed)
I wrote in my last note 30 units BID  Re-sent Rx.   Laroy Apple, MD [facility] 12/26/2015, 4:50 PM

## 2015-12-29 NOTE — Telephone Encounter (Signed)
Patient aware that medication has been sent to the pharmacy 

## 2016-01-07 ENCOUNTER — Encounter: Payer: Self-pay | Admitting: Pharmacist

## 2016-01-07 ENCOUNTER — Other Ambulatory Visit: Payer: Self-pay

## 2016-01-07 ENCOUNTER — Ambulatory Visit (INDEPENDENT_AMBULATORY_CARE_PROVIDER_SITE_OTHER): Payer: BLUE CROSS/BLUE SHIELD | Admitting: Pharmacist

## 2016-01-07 DIAGNOSIS — L97519 Non-pressure chronic ulcer of other part of right foot with unspecified severity: Principal | ICD-10-CM

## 2016-01-07 DIAGNOSIS — E11621 Type 2 diabetes mellitus with foot ulcer: Secondary | ICD-10-CM

## 2016-01-07 DIAGNOSIS — E1142 Type 2 diabetes mellitus with diabetic polyneuropathy: Secondary | ICD-10-CM | POA: Diagnosis not present

## 2016-01-07 DIAGNOSIS — Z89431 Acquired absence of right foot: Secondary | ICD-10-CM

## 2016-01-07 DIAGNOSIS — Z794 Long term (current) use of insulin: Secondary | ICD-10-CM

## 2016-01-07 MED ORDER — INSULIN GLARGINE 100 UNIT/ML SOLOSTAR PEN: PEN_INJECTOR | SUBCUTANEOUS | Status: DC

## 2016-01-07 NOTE — Patient Instructions (Signed)
Increase lantus by 1 unit each day until fasting blood glucose is less than 130.  If you have a low blood glucose then go back to the previous day's dose.   Diabetes and Standards of Medical Care   Diabetes is complicated. You may find that your diabetes team includes a dietitian, nurse, diabetes educator, eye doctor, and more. To help everyone know what is going on and to help you get the care you deserve, the following schedule of care was developed to help keep you on track. Below are the tests, exams, vaccines, medicines, education, and plans you will need.  Blood Glucose Goals Prior to meals = 80 - 130 Within 2 hours of the start of a meal = less than 180  HbA1c test (goal is less than 7.0% - your last value was 8.2%) This test shows how well you have controlled your glucose over the past 2 to 3 months. It is used to see if your diabetes management plan needs to be adjusted.   It is performed at least 2 times a year if you are meeting treatment goals.  It is performed 4 times a year if therapy has changed or if you are not meeting treatment goals.  Blood pressure test  This test is performed at every routine medical visit. The goal is less than 140/90 mmHg for most people, but 130/80 mmHg in some cases. Ask your health care provider about your goal.  Dental exam  Follow up with the dentist regularly.  Eye exam  If you are diagnosed with type 1 diabetes as a child, get an exam upon reaching the age of 40 years or older and have had diabetes for 3 to 5 years. Yearly eye exams are recommended after that initial eye exam.  If you are diagnosed with type 1 diabetes as an adult, get an exam within 5 years of diagnosis and then yearly.  If you are diagnosed with type 2 diabetes, get an exam as soon as possible after the diagnosis and then yearly.  Foot care exam  Visual foot exams are performed at every routine medical visit. The exams check for cuts, injuries, or other problems with  the feet.  A comprehensive foot exam should be done yearly. This includes visual inspection as well as assessing foot pulses and testing for loss of sensation.  Check your feet nightly for cuts, injuries, or other problems with your feet. Tell your health care provider if anything is not healing.  Kidney function test (urine microalbumin)  This test is performed once a year.  Type 1 diabetes: The first test is performed 5 years after diagnosis.  Type 2 diabetes: The first test is performed at the time of diagnosis.  A serum creatinine and estimated glomerular filtration rate (eGFR) test is done once a year to assess the level of chronic kidney disease (CKD), if present.  Lipid profile (cholesterol, HDL, LDL, triglycerides)  Performed every 5 years for most people.  The goal for LDL is less than 100 mg/dL. If you are at high risk, the goal is less than 70 mg/dL.  The goal for HDL is 40 mg/dL to 50 mg/dL for men and 50 mg/dL to 60 mg/dL for women. An HDL cholesterol of 60 mg/dL or higher gives some protection against heart disease.  The goal for triglycerides is less than 150 mg/dL.  Influenza vaccine, pneumococcal vaccine, and hepatitis B vaccine  The influenza vaccine is recommended yearly.  The pneumococcal vaccine is generally given  once in a lifetime. However, there are some instances when another vaccination is recommended. Check with your health care provider.  The hepatitis B vaccine is also recommended for adults with diabetes.  Diabetes self-management education  Education is recommended at diagnosis and ongoing as needed.  Treatment plan  Your treatment plan is reviewed at every medical visit.  Document Released: 05/09/2009 Document Revised: 03/14/2013 Document Reviewed: 12/12/2012 [facility] Patient Information 2014 [place].

## 2016-01-07 NOTE — Progress Notes (Signed)
Subjective:    Rick Lane is a 65 y.o. male who presents for an initial evaluation of Type 2 diabetes mellitus which is not treated with insulin.  Current symptoms/problems include hyperglycemia, paresthesia of the feet and recent transmetatarsal amputation Patient's wife is present with him and she is very concerned about patient's wound from amputation.  She states that over the last 24 hours a "hole" has developed in the wound.  She has packed the hole.  Dr Sabra Heck comes in to evaluate the wound.  He states that it appears to be healing well, will take some times but he does not see any signs of infection.  Does note area of more would openness on the anterior dorsal side of right foot.   The patient was initially diagnosed with Type 2 diabetes mellitus about 15 years ago.   Known diabetic complications: peripheral neuropathy, cardiovascular disease and peripheral vascular disease Cardiovascular risk factors: advanced age (older than 53 for men, 91 for women), diabetes mellitus, dyslipidemia, hypertension, male gender, obesity (BMI >= 30 kg/m2) and sedentary lifestyle Current diabetic medications include glimepiride 10mg  take 1 tablet bid; Lantus 30 units bid; Novolog 10 units tid prior to meals.   Eye exam current (within one year): yes Prior visit with dietician: yes - about 10 years ago Current diet: in general, a "healthy" diet   but patient's wife states he stays up to 3am and during that time she doesn't know what he eats.  Current exercise: none  Current monitoring regimen: home blood tests - 3-4 times daily Home blood sugar records: ranges from 94 to 321 Any episodes of hypoglycemia? no  Is He on ACE inhibitor or angiotensin II receptor blocker?  No    Objective:    There were no vitals taken for this visit.   A1c = 8.2% (11/14/2015)  Lab Review GLUCOSE (mg/dL)  Date Value  11/14/2015 273*  08/11/2015 164*   GLUCOSE, BLD (mg/dL)  Date Value  12/12/2015 241*   08/02/2011 150*   CO2 (mmol/L)  Date Value  12/12/2015 23  11/14/2015 23  08/11/2015 24   BUN (mg/dL)  Date Value  12/12/2015 29*  11/14/2015 23  08/11/2015 23  08/02/2011 19   CREATININE, SER (mg/dL)  Date Value  12/12/2015 1.37*  11/14/2015 1.23  08/11/2015 1.23    Assessment:    Diabetes Mellitus type II, under inadequate control.   Wound care - patient and wife in need of reassurance and assistance in wound care   Plan:    1.  Rx changes: increse Lantus by 1 units BID each day until goal FBG is less than 130 or if patient has hypoglycemia and he should then go back to previous days dose.    Continue Novolog 10 units tid but changed to pens in stead of vial to improve compliance. 2.  Education: Reviewed 'ABCs' of diabetes management (respective goals in parentheses):  A1C (<7), blood pressure (<130/80), and cholesterol (LDL <100). 3. Referral for home health to assist with wound care and dressing changes approved by Dr Sabra Heck.  4. CHO counting diet discussed.  Reviewed CHO amount in various foods and how to read nutrition labels.  Discussed recommended serving sizes.  5. Follow up: 4 weeks

## 2016-01-19 ENCOUNTER — Other Ambulatory Visit (HOSPITAL_COMMUNITY): Payer: Self-pay | Admitting: Family

## 2016-01-22 MED ORDER — DEXTROSE 5 % IV SOLN
3.0000 g | INTRAVENOUS | Status: DC
  Filled 2016-01-22: qty 3000

## 2016-01-22 NOTE — Progress Notes (Signed)
Spoke with pt to give pre-op instructions and pt stated " I called and canceled." A voice message was left with Malachy Mood, Surgical Scheduler, to make aware, since pt is still listed on OR schedule.

## 2016-01-23 ENCOUNTER — Ambulatory Visit (INDEPENDENT_AMBULATORY_CARE_PROVIDER_SITE_OTHER): Payer: BLUE CROSS/BLUE SHIELD | Admitting: Family Medicine

## 2016-01-23 ENCOUNTER — Encounter (HOSPITAL_COMMUNITY): Admission: RE | Payer: Self-pay | Source: Ambulatory Visit

## 2016-01-23 ENCOUNTER — Inpatient Hospital Stay (HOSPITAL_COMMUNITY)
Admission: RE | Admit: 2016-01-23 | Payer: BLUE CROSS/BLUE SHIELD | Source: Ambulatory Visit | Admitting: Orthopedic Surgery

## 2016-01-23 ENCOUNTER — Encounter: Payer: Self-pay | Admitting: Family Medicine

## 2016-01-23 VITALS — BP 128/70 | HR 85 | Temp 97.5°F

## 2016-01-23 DIAGNOSIS — Z794 Long term (current) use of insulin: Secondary | ICD-10-CM

## 2016-01-23 DIAGNOSIS — E1142 Type 2 diabetes mellitus with diabetic polyneuropathy: Secondary | ICD-10-CM | POA: Diagnosis not present

## 2016-01-23 DIAGNOSIS — Z89431 Acquired absence of right foot: Secondary | ICD-10-CM | POA: Diagnosis not present

## 2016-01-23 SURGERY — AMPUTATION BELOW KNEE
Anesthesia: General | Laterality: Right

## 2016-01-23 MED ORDER — DOXYCYCLINE HYCLATE 100 MG PO TABS: 100.0000 mg | ORAL_TABLET | Freq: Two times a day (BID) | ORAL | Status: DC

## 2016-01-23 NOTE — Patient Instructions (Signed)
Great to see you guys!  Give Dr. Sharol Given a call to discuss your concerns

## 2016-01-23 NOTE — Progress Notes (Addendum)
HPI  Patient presents today here for follow-up after transmetatarsal amputation of the foot.  Patient has been seeing the orthopedic surgeon. They've been advised to get a BKA and a very concerned about why he should get this and have several questions pertaining to this.  They've been changing the wound at least one or 2 times a day.  He just finished a course of osseous cycling.  He has no fevers, chills, sweats, or pain.  PMH: Smoking status noted ROS: Per HPI  Objective: BP 128/70 mmHg  Pulse 85  Temp(Src) 97.5 F (36.4 C) (Oral)  Ht   Wt  Gen: NAD, alert, cooperative with exam HEENT: NCAT, EOMI, PERRL CV: RRR, good S1/S2, no murmur Resp: CTABL, no wheezes, non-labored Ext: No edema, warm Neuro: Alert and oriented, No gross deficits  Skin Right foot with healing surgical wound, soiled dressing was removed and replaced, packing is not removed completely.   Assessment and plan:  # s/p Transmetatarsal amputation of the right foot, type 2 diabetes Patient has several questions about further amputation, I believe their difficulty lies an incomplete understanding of the disease process They have requested another orthopedic surgeon referral for second opinion, I have asked them to call their orthopedic surgeon, who is a very trusted surgeon in [place], and discuss further with him. Restart doxycycline, no clear active infection, however has characteristic odor concerning for infection I have written home health orders for wound care, the pain is very frustrated with not getting wound care, they've been told by home health that they need orders.     Orders Placed This Encounter  Procedures  . Ambulatory referral to Orthopedic Surgery    Referral Priority:  Routine    Referral Type:  Surgical    Referral Reason:  Specialty Services Required    Requested Specialty:  Orthopedic Surgery    Number of Visits Requested:  1  . Home Health    Order Specific Question:   To provide the following care/treatments    Answer:  RN  . Face-to-face encounter (required for Medicare/Medicaid patients)    I Kenn File certify that this patient is under my care and that I, or a nurse practitioner or physician's assistant working with me, had a face-to-face encounter that meets the physician face-to-face encounter requirements with this patient on 01/23/2016. The encounter with the patient was in whole, or in part for the following medical condition(s) which is the primary reason for home health care (List medical condition): Type 2 diabetes, transmetatarsal amputation of the right foot.    Order Specific Question:  The encounter with the patient was in whole, or in part, for the following medical condition, which is the primary reason for home health care    Answer:  Transmetatarsal amputation of the right foot, wound care    Order Specific Question:  I certify that, based on my findings, the following services are medically necessary home health services    Answer:  Nursing    Order Specific Question:  Reason for Medically Necessary Home Health Services    Answer:  Skilled Nursing- Complex Wound Care    Order Specific Question:  My clinical findings support the need for the above services    Answer:  Unable to leave home safely without assistance and/or assistive device    Order Specific Question:  Further, I certify that my clinical findings support that this patient is homebound due to:    Answer:  Unable to leave home safely without  assistance    Meds ordered this encounter  Medications  . doxycycline (VIBRA-TABS) 100 MG tablet    Sig: Take 1 tablet (100 mg total) by mouth 2 (two) times daily. 1 po bid    Dispense:  20 tablet    Refill:  0    Laroy Apple, MD [facility] 01/23/2016, 12:27 PM

## 2016-02-04 ENCOUNTER — Other Ambulatory Visit: Payer: Self-pay | Admitting: Family Medicine

## 2016-02-05 ENCOUNTER — Telehealth: Payer: Self-pay | Admitting: Family Medicine

## 2016-02-05 NOTE — Telephone Encounter (Signed)
Last seen 01/23/16  Dr Wendi Snipes

## 2016-02-05 NOTE — Telephone Encounter (Signed)
Already noted

## 2016-02-16 ENCOUNTER — Ambulatory Visit: Payer: BLUE CROSS/BLUE SHIELD | Admitting: Family Medicine

## 2016-02-19 ENCOUNTER — Other Ambulatory Visit: Payer: Self-pay | Admitting: Family Medicine

## 2016-02-19 NOTE — Telephone Encounter (Signed)
Left detailed message rx requested was sent to the pharmacy and the need to follow up for re evaluation.

## 2016-03-01 ENCOUNTER — Ambulatory Visit (INDEPENDENT_AMBULATORY_CARE_PROVIDER_SITE_OTHER): Payer: BLUE CROSS/BLUE SHIELD | Admitting: Family Medicine

## 2016-03-01 ENCOUNTER — Encounter: Payer: Self-pay | Admitting: Family Medicine

## 2016-03-01 VITALS — BP 111/59 | HR 91 | Temp 97.9°F | Ht 74.0 in

## 2016-03-01 DIAGNOSIS — F329 Major depressive disorder, single episode, unspecified: Secondary | ICD-10-CM | POA: Diagnosis not present

## 2016-03-01 DIAGNOSIS — E1142 Type 2 diabetes mellitus with diabetic polyneuropathy: Secondary | ICD-10-CM

## 2016-03-01 DIAGNOSIS — Z794 Long term (current) use of insulin: Secondary | ICD-10-CM | POA: Diagnosis not present

## 2016-03-01 DIAGNOSIS — Z89431 Acquired absence of right foot: Secondary | ICD-10-CM

## 2016-03-01 DIAGNOSIS — F32A Depression, unspecified: Secondary | ICD-10-CM

## 2016-03-01 LAB — BAYER DCA HB A1C WAIVED: HB A1C: 7.6 % — AB (ref <7.0)

## 2016-03-01 MED ORDER — ATORVASTATIN CALCIUM 20 MG PO TABS: 20.0000 mg | ORAL_TABLET | Freq: Every day | ORAL | 3 refills | Status: DC

## 2016-03-01 MED ORDER — INSULIN GLARGINE 100 UNIT/ML SOLOSTAR PEN: PEN_INJECTOR | SUBCUTANEOUS | 5 refills | Status: DC

## 2016-03-01 MED ORDER — SERTRALINE HCL 50 MG PO TABS: 50.0000 mg | ORAL_TABLET | Freq: Every day | ORAL | 3 refills | Status: DC

## 2016-03-01 MED ORDER — INSULIN ASPART 100 UNIT/ML FLEXPEN: PEN_INJECTOR | SUBCUTANEOUS | 11 refills | Status: DC

## 2016-03-01 MED ORDER — GLIPIZIDE 10 MG PO TABS: 10.0000 mg | ORAL_TABLET | Freq: Two times a day (BID) | ORAL | 3 refills | Status: DC

## 2016-03-01 NOTE — Progress Notes (Signed)
   HPI  Patient presents today here for follow-up of diabetic foot wound status post transmetatarsal amputation, and diabetes.  Diabetes Good medication compliance No hypoglycemia Watching his diet reasonably well. Average fasting blood sugar is 1:30 to 150.  Diabetic foot wound, status post transmetatarsal amputation He's been struggling with this quite a bit lately. He is not in favor of amputation so he has not had it performed yet, however lately he's been having more lower leg pain and had 2 episodes of fever. He has not had a fever and more than 5 days. He's been taking doxycycline, he started Cipro as well after his fever. He has follow-up with his orthopedic surgeon in 3-4 days and plans to have surgery.  He reports normal appetite, denies any malaise. He feels well currently.  PMH: Smoking status noted ROS: Per HPI  Objective: BP (!) 111/59 (BP Location: Left Arm, Patient Position: Sitting, Cuff Size: Large)   Pulse 91   Temp 97.9 F (36.6 C) (Oral)   Ht 6\' 2"  (1.88 m)  Gen: NAD, alert, cooperative with exam HEENT: NCAT CV: RRR, good S1/S2, no murmur Resp: CTABL, no wheezes, non-labored Ext: No edema, warm Neuro: Alert and oriented, No gross deficits  Skin: Right foot with healing surgical wound, still open over the medial portion, packing partially removed and replaced., some peeling skin on the right lateral foot. Dark erythematous circular wound around the distal lateral portion of his foot.  soiled dressing removed and replaced,  No unusual warmth, erythema, or odor.  Assessment and plan:  # status post transmetatarsal amputation I'm very concerned about smoldering infection. He has added Cipro to his doxycycline, continue. He has follow-up with orthopedic surgery in 3 or 4 days, I've encouraged him to pursue surgery as has been recommended by his surgeon. Labs today No Signs of worsening infection  # Type 2 diabetes Control improving Continue taking  NovoLog 10 units 3 times a day with meals, continue 40 units of Lantus twice daily. Continue glipizide Refilled medications.  Depression Refilled Zoloft today, stable   Orders Placed This Encounter  Procedures  . Bayer DCA Hb A1c Waived    Meds ordered this encounter  Medications  . ciprofloxacin (CIPRO) 500 MG tablet    Sig: Take 500 mg by mouth daily.    Refill:  0    Laroy Apple, MD [facility] 03/01/2016, 11:10 AM

## 2016-03-01 NOTE — Patient Instructions (Signed)
Great to see you!  Continue all antibuiotics  Come back in 3 months for diabetes follow up  Be sure to see Dr. Sharol Given this week

## 2016-03-02 LAB — CMP14+EGFR
A/G RATIO: 1.2 (ref 1.2–2.2)
ALT: 26 IU/L (ref 0–44)
AST: 16 IU/L (ref 0–40)
Albumin: 3.6 g/dL (ref 3.6–4.8)
Alkaline Phosphatase: 47 IU/L (ref 39–117)
BILIRUBIN TOTAL: 0.2 mg/dL (ref 0.0–1.2)
BUN/Creatinine Ratio: 21 (ref 10–24)
BUN: 24 mg/dL (ref 8–27)
CHLORIDE: 99 mmol/L (ref 96–106)
CO2: 22 mmol/L (ref 18–29)
Calcium: 9.2 mg/dL (ref 8.6–10.2)
Creatinine, Ser: 1.13 mg/dL (ref 0.76–1.27)
GFR, EST AFRICAN AMERICAN: 79 mL/min/{1.73_m2} (ref >59)
GFR, EST NON AFRICAN AMERICAN: 68 mL/min/{1.73_m2} (ref >59)
GLOBULIN, TOTAL: 2.9 g/dL (ref 1.5–4.5)
Glucose: 142 mg/dL — ABNORMAL HIGH (ref 65–99)
POTASSIUM: 4.9 mmol/L (ref 3.5–5.2)
SODIUM: 138 mmol/L (ref 134–144)
TOTAL PROTEIN: 6.5 g/dL (ref 6.0–8.5)

## 2016-03-02 LAB — SPECIMEN STATUS

## 2016-03-02 LAB — CBC WITH DIFFERENTIAL/PLATELET
BASOS ABS: 0.1 10*3/uL (ref 0.0–0.2)
Basos: 1 %
EOS (ABSOLUTE): 0.4 10*3/uL (ref 0.0–0.4)
Eos: 4 %
HEMOGLOBIN: 10.3 g/dL — AB (ref 12.6–17.7)
Hematocrit: 32.1 % — ABNORMAL LOW (ref 37.5–51.0)
Immature Grans (Abs): 0.1 10*3/uL (ref 0.0–0.1)
Immature Granulocytes: 1 %
LYMPHS ABS: 1.5 10*3/uL (ref 0.7–3.1)
Lymphs: 18 %
MCH: 26.7 pg (ref 26.6–33.0)
MCHC: 32.1 g/dL (ref 31.5–35.7)
MCV: 83 fL (ref 79–97)
MONOS ABS: 0.8 10*3/uL (ref 0.1–0.9)
Monocytes: 9 %
NEUTROS ABS: 5.9 10*3/uL (ref 1.4–7.0)
Neutrophils: 67 %
Platelets: 471 10*3/uL — ABNORMAL HIGH (ref 150–379)
RBC: 3.86 x10E6/uL — AB (ref 4.14–5.80)
RDW: 15.4 % (ref 12.3–15.4)
WBC: 8.6 10*3/uL (ref 3.4–10.8)

## 2016-03-02 LAB — SPECIMEN STATUS REPORT

## 2016-03-03 ENCOUNTER — Other Ambulatory Visit: Payer: Self-pay | Admitting: Family Medicine

## 2016-03-04 ENCOUNTER — Telehealth: Payer: Self-pay | Admitting: *Deleted

## 2016-03-04 NOTE — Telephone Encounter (Signed)
-----   Message from [name], MD sent at 03/04/2016 12:06 PM EDT ----- Will you call about his labs? His kidneys and liver look good He does have new anemia, it is mild and does not require blood transfusion or other major intervention. 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 I would recommend follow up in a  Month or so for specific labs and discussion about work up. Probably need to get a colonoscopy as well to rule out source of GI bleeding.  Thanks! sam

## 2016-03-05 ENCOUNTER — Encounter (HOSPITAL_COMMUNITY): Payer: Self-pay | Admitting: Surgery

## 2016-03-05 ENCOUNTER — Inpatient Hospital Stay (HOSPITAL_COMMUNITY)
Admission: RE | Admit: 2016-03-05 | Discharge: 2016-03-08 | DRG: 475 | Disposition: A | Discharge status: Skilled Nursing Facility | Payer: BLUE CROSS/BLUE SHIELD | Source: Ambulatory Visit | Attending: Orthopedic Surgery | Admitting: Orthopedic Surgery

## 2016-03-05 ENCOUNTER — Encounter (HOSPITAL_COMMUNITY)
Admission: RE | Disposition: A | Discharge status: Skilled Nursing Facility | Payer: Self-pay | Source: Ambulatory Visit | Attending: Orthopedic Surgery

## 2016-03-05 ENCOUNTER — Other Ambulatory Visit (HOSPITAL_COMMUNITY): Payer: Self-pay | Admitting: Family

## 2016-03-05 ENCOUNTER — Inpatient Hospital Stay (HOSPITAL_COMMUNITY): Payer: BLUE CROSS/BLUE SHIELD | Admitting: Anesthesiology

## 2016-03-05 DIAGNOSIS — R7881 Bacteremia: Secondary | ICD-10-CM | POA: Diagnosis present

## 2016-03-05 DIAGNOSIS — E785 Hyperlipidemia, unspecified: Secondary | ICD-10-CM | POA: Diagnosis present

## 2016-03-05 DIAGNOSIS — T8781 Dehiscence of amputation stump: Secondary | ICD-10-CM | POA: Diagnosis present

## 2016-03-05 DIAGNOSIS — E1140 Type 2 diabetes mellitus with diabetic neuropathy, unspecified: Secondary | ICD-10-CM | POA: Diagnosis present

## 2016-03-05 DIAGNOSIS — L02611 Cutaneous abscess of right foot: Secondary | ICD-10-CM | POA: Diagnosis present

## 2016-03-05 DIAGNOSIS — IMO0002 Reserved for concepts with insufficient information to code with codable children: Secondary | ICD-10-CM

## 2016-03-05 DIAGNOSIS — E1169 Type 2 diabetes mellitus with other specified complication: Secondary | ICD-10-CM | POA: Diagnosis present

## 2016-03-05 DIAGNOSIS — M869 Osteomyelitis, unspecified: Secondary | ICD-10-CM | POA: Diagnosis present

## 2016-03-05 DIAGNOSIS — L03115 Cellulitis of right lower limb: Secondary | ICD-10-CM | POA: Diagnosis present

## 2016-03-05 DIAGNOSIS — J45909 Unspecified asthma, uncomplicated: Secondary | ICD-10-CM | POA: Diagnosis present

## 2016-03-05 DIAGNOSIS — Z79899 Other long term (current) drug therapy: Secondary | ICD-10-CM

## 2016-03-05 DIAGNOSIS — Z89511 Acquired absence of right leg below knee: Secondary | ICD-10-CM

## 2016-03-05 DIAGNOSIS — Z881 Allergy status to other antibiotic agents status: Secondary | ICD-10-CM | POA: Diagnosis not present

## 2016-03-05 DIAGNOSIS — T8743 Infection of amputation stump, right lower extremity: Principal | ICD-10-CM | POA: Diagnosis present

## 2016-03-05 DIAGNOSIS — Z794 Long term (current) use of insulin: Secondary | ICD-10-CM | POA: Diagnosis not present

## 2016-03-05 DIAGNOSIS — E669 Obesity, unspecified: Secondary | ICD-10-CM | POA: Diagnosis present

## 2016-03-05 HISTORY — PX: AMPUTATION: SHX166

## 2016-03-05 LAB — SURGICAL PCR SCREEN
MRSA, PCR: NEGATIVE
Staphylococcus aureus: NEGATIVE

## 2016-03-05 LAB — GLUCOSE, CAPILLARY
GLUCOSE-CAPILLARY: 178 mg/dL — AB (ref 65–99)
GLUCOSE-CAPILLARY: 211 mg/dL — AB (ref 65–99)
Glucose-Capillary: 184 mg/dL — ABNORMAL HIGH (ref 65–99)
Glucose-Capillary: 110 mg/dL — ABNORMAL HIGH (ref 65–99)

## 2016-03-05 SURGERY — AMPUTATION BELOW KNEE
Anesthesia: Regional | Site: Leg Lower | Laterality: Right

## 2016-03-05 MED ORDER — MIDAZOLAM HCL 2 MG/2ML IJ SOLN
INTRAMUSCULAR | Status: AC
  Administered 2016-03-05: 1 mg via INTRAVENOUS
  Filled 2016-03-05: qty 2

## 2016-03-05 MED ORDER — FENTANYL CITRATE (PF) 100 MCG/2ML IJ SOLN
50.0000 ug | Freq: Once | INTRAMUSCULAR | Status: AC
  Administered 2016-03-05: 50 ug via INTRAVENOUS

## 2016-03-05 MED ORDER — FENTANYL CITRATE (PF) 100 MCG/2ML IJ SOLN
INTRAMUSCULAR | Status: DC | PRN
  Administered 2016-03-05: 25 ug via INTRAVENOUS

## 2016-03-05 MED ORDER — HYDROMORPHONE HCL 1 MG/ML IJ SOLN: 1.0000 mg | INTRAMUSCULAR | Status: DC | PRN

## 2016-03-05 MED ORDER — BUPIVACAINE-EPINEPHRINE (PF) 0.5% -1:200000 IJ SOLN
INTRAMUSCULAR | Status: DC | PRN
  Administered 2016-03-05: 30 mL via PERINEURAL
  Administered 2016-03-05: 20 mL via PERINEURAL

## 2016-03-05 MED ORDER — SODIUM CHLORIDE 0.9 % IV SOLN: INTRAVENOUS | Status: DC

## 2016-03-05 MED ORDER — ONDANSETRON HCL 4 MG/2ML IJ SOLN
INTRAMUSCULAR | Status: DC | PRN
  Administered 2016-03-05: 4 mg via INTRAVENOUS

## 2016-03-05 MED ORDER — ONDANSETRON HCL 4 MG/2ML IJ SOLN
INTRAMUSCULAR | Status: AC
  Filled 2016-03-05: qty 2

## 2016-03-05 MED ORDER — LACTATED RINGERS IV SOLN
INTRAVENOUS | Status: DC
  Administered 2016-03-05 (×2): via INTRAVENOUS

## 2016-03-05 MED ORDER — OXYCODONE HCL 5 MG PO TABS: 5.0000 mg | ORAL_TABLET | ORAL | Status: DC | PRN

## 2016-03-05 MED ORDER — MIDAZOLAM HCL 2 MG/2ML IJ SOLN
1.0000 mg | Freq: Once | INTRAMUSCULAR | Status: AC
  Administered 2016-03-05: 1 mg via INTRAVENOUS

## 2016-03-05 MED ORDER — METOCLOPRAMIDE HCL 5 MG PO TABS: 5.0000 mg | ORAL_TABLET | Freq: Three times a day (TID) | ORAL | Status: DC | PRN

## 2016-03-05 MED ORDER — INSULIN GLARGINE 100 UNIT/ML ~~LOC~~ SOLN
20.0000 [IU] | Freq: Two times a day (BID) | SUBCUTANEOUS | Status: DC
  Administered 2016-03-05 – 2016-03-08 (×6): 20 [IU] via SUBCUTANEOUS
  Filled 2016-03-05 (×7): qty 0.2

## 2016-03-05 MED ORDER — CHLORHEXIDINE GLUCONATE 4 % EX LIQD: 60.0000 mL | Freq: Once | CUTANEOUS | Status: DC

## 2016-03-05 MED ORDER — PROPOFOL 10 MG/ML IV BOLUS
INTRAVENOUS | Status: DC | PRN
  Administered 2016-03-05: 30 mg via INTRAVENOUS

## 2016-03-05 MED ORDER — ONDANSETRON HCL 4 MG/2ML IJ SOLN: 4.0000 mg | Freq: Four times a day (QID) | INTRAMUSCULAR | Status: DC | PRN

## 2016-03-05 MED ORDER — PROPOFOL 10 MG/ML IV BOLUS
INTRAVENOUS | Status: AC
  Filled 2016-03-05: qty 20

## 2016-03-05 MED ORDER — ACETAMINOPHEN 325 MG PO TABS: 650.0000 mg | ORAL_TABLET | Freq: Four times a day (QID) | ORAL | Status: DC | PRN

## 2016-03-05 MED ORDER — METHOCARBAMOL 500 MG PO TABS: 500.0000 mg | ORAL_TABLET | Freq: Four times a day (QID) | ORAL | Status: DC | PRN

## 2016-03-05 MED ORDER — INSULIN ASPART 100 UNIT/ML ~~LOC~~ SOLN
4.0000 [IU] | Freq: Three times a day (TID) | SUBCUTANEOUS | Status: DC
  Administered 2016-03-05 – 2016-03-08 (×9): 4 [IU] via SUBCUTANEOUS

## 2016-03-05 MED ORDER — ACETAMINOPHEN 650 MG RE SUPP: 650.0000 mg | Freq: Four times a day (QID) | RECTAL | Status: DC | PRN

## 2016-03-05 MED ORDER — MIDAZOLAM HCL 5 MG/5ML IJ SOLN
INTRAMUSCULAR | Status: DC | PRN
  Administered 2016-03-05: 2 mg via INTRAVENOUS

## 2016-03-05 MED ORDER — DEXTROSE 5 % IV SOLN
INTRAVENOUS | Status: DC | PRN
  Administered 2016-03-05: 3 g via INTRAVENOUS

## 2016-03-05 MED ORDER — ASPIRIN EC 325 MG PO TBEC
325.0000 mg | DELAYED_RELEASE_TABLET | Freq: Every day | ORAL | Status: DC
  Administered 2016-03-05 – 2016-03-08 (×4): 325 mg via ORAL
  Filled 2016-03-05 (×4): qty 1

## 2016-03-05 MED ORDER — CEFAZOLIN SODIUM-DEXTROSE 2-4 GM/100ML-% IV SOLN
INTRAVENOUS | Status: AC
  Filled 2016-03-05: qty 100

## 2016-03-05 MED ORDER — 0.9 % SODIUM CHLORIDE (POUR BTL) OPTIME
TOPICAL | Status: DC | PRN
  Administered 2016-03-05: 1000 mL

## 2016-03-05 MED ORDER — PROPOFOL 500 MG/50ML IV EMUL
INTRAVENOUS | Status: DC | PRN
  Administered 2016-03-05: 60 ug/kg/min via INTRAVENOUS

## 2016-03-05 MED ORDER — FENTANYL CITRATE (PF) 250 MCG/5ML IJ SOLN
INTRAMUSCULAR | Status: AC
  Filled 2016-03-05: qty 5

## 2016-03-05 MED ORDER — METHOCARBAMOL 1000 MG/10ML IJ SOLN: 500.0000 mg | Freq: Four times a day (QID) | INTRAVENOUS | Status: DC | PRN

## 2016-03-05 MED ORDER — FENTANYL CITRATE (PF) 100 MCG/2ML IJ SOLN
INTRAMUSCULAR | Status: AC
  Administered 2016-03-05: 50 ug via INTRAVENOUS
  Filled 2016-03-05: qty 2

## 2016-03-05 MED ORDER — MIDAZOLAM HCL 2 MG/2ML IJ SOLN
INTRAMUSCULAR | Status: AC
  Filled 2016-03-05: qty 2

## 2016-03-05 MED ORDER — PHENYLEPHRINE 40 MCG/ML (10ML) SYRINGE FOR IV PUSH (FOR BLOOD PRESSURE SUPPORT)
PREFILLED_SYRINGE | INTRAVENOUS | Status: AC
  Filled 2016-03-05: qty 10

## 2016-03-05 MED ORDER — CEFAZOLIN SODIUM-DEXTROSE 2-4 GM/100ML-% IV SOLN
2.0000 g | Freq: Four times a day (QID) | INTRAVENOUS | Status: AC
  Administered 2016-03-05 – 2016-03-06 (×3): 2 g via INTRAVENOUS
  Filled 2016-03-05 (×3): qty 100

## 2016-03-05 MED ORDER — INSULIN ASPART 100 UNIT/ML ~~LOC~~ SOLN
0.0000 [IU] | Freq: Three times a day (TID) | SUBCUTANEOUS | Status: DC
  Administered 2016-03-05: 3 [IU] via SUBCUTANEOUS
  Administered 2016-03-06: 8 [IU] via SUBCUTANEOUS
  Administered 2016-03-06: 2 [IU] via SUBCUTANEOUS
  Administered 2016-03-06: 3 [IU] via SUBCUTANEOUS
  Administered 2016-03-07 (×2): 5 [IU] via SUBCUTANEOUS
  Administered 2016-03-07: 3 [IU] via SUBCUTANEOUS
  Administered 2016-03-08: 2 [IU] via SUBCUTANEOUS
  Administered 2016-03-08: 5 [IU] via SUBCUTANEOUS

## 2016-03-05 MED ORDER — PHENYLEPHRINE HCL 10 MG/ML IJ SOLN
INTRAMUSCULAR | Status: DC | PRN
  Administered 2016-03-05: 80 ug via INTRAVENOUS

## 2016-03-05 MED ORDER — METOCLOPRAMIDE HCL 5 MG/ML IJ SOLN: 5.0000 mg | Freq: Three times a day (TID) | INTRAMUSCULAR | Status: DC | PRN

## 2016-03-05 MED ORDER — MUPIROCIN 2 % EX OINT
TOPICAL_OINTMENT | CUTANEOUS | Status: AC
  Filled 2016-03-05: qty 22

## 2016-03-05 MED ORDER — ONDANSETRON HCL 4 MG PO TABS: 4.0000 mg | ORAL_TABLET | Freq: Four times a day (QID) | ORAL | Status: DC | PRN

## 2016-03-05 MED ORDER — KETOROLAC TROMETHAMINE 15 MG/ML IJ SOLN
15.0000 mg | Freq: Four times a day (QID) | INTRAMUSCULAR | Status: AC
  Administered 2016-03-05 – 2016-03-06 (×2): 15 mg via INTRAVENOUS
  Filled 2016-03-05 (×3): qty 1

## 2016-03-05 MED ORDER — CEFAZOLIN SODIUM-DEXTROSE 2-4 GM/100ML-% IV SOLN: 2.0000 g | INTRAVENOUS | Status: DC

## 2016-03-05 SURGICAL SUPPLY — 37 items
BLADE SAW RECIP 87.9 MT (BLADE) ×4 IMPLANT
BLADE SURG 21 STRL SS (BLADE) ×2 IMPLANT
BNDG COHESIVE 6X5 TAN STRL LF (GAUZE/BANDAGES/DRESSINGS) ×2 IMPLANT
BNDG GAUZE ELAST 4 BULKY (GAUZE/BANDAGES/DRESSINGS) ×4 IMPLANT
CANISTER WOUND CARE 500ML ATS (WOUND CARE) ×2 IMPLANT
COVER SURGICAL LIGHT HANDLE (MISCELLANEOUS) ×4 IMPLANT
CUFF TOURNIQUET SINGLE 34IN LL (TOURNIQUET CUFF) IMPLANT
CUFF TOURNIQUET SINGLE 44IN (TOURNIQUET CUFF) IMPLANT
DRAPE EXTREMITY T 121X128X90 (DRAPE) ×2 IMPLANT
DRAPE INCISE IOBAN 66X45 STRL (DRAPES) ×2 IMPLANT
DRAPE PROXIMA HALF (DRAPES) ×2 IMPLANT
DRAPE U-SHAPE 47X51 STRL (DRAPES) ×2 IMPLANT
DRSG ADAPTIC 3X8 NADH LF (GAUZE/BANDAGES/DRESSINGS) IMPLANT
DRSG PAD ABDOMINAL 8X10 ST (GAUZE/BANDAGES/DRESSINGS) ×2 IMPLANT
DRSG VAC ATS LRG SENSATRAC (GAUZE/BANDAGES/DRESSINGS) ×2 IMPLANT
DURAPREP 26ML APPLICATOR (WOUND CARE) ×2 IMPLANT
ELECT REM PT RETURN 9FT ADLT (ELECTROSURGICAL) ×2
ELECTRODE REM PT RTRN 9FT ADLT (ELECTROSURGICAL) ×1 IMPLANT
GAUZE SPONGE 4X4 12PLY STRL (GAUZE/BANDAGES/DRESSINGS) IMPLANT
GLOVE BIOGEL PI IND STRL 9 (GLOVE) ×1 IMPLANT
GLOVE BIOGEL PI INDICATOR 9 (GLOVE) ×1
GLOVE SURG ORTHO 9.0 STRL STRW (GLOVE) ×2 IMPLANT
GOWN STRL REUS W/ TWL XL LVL3 (GOWN DISPOSABLE) ×2 IMPLANT
GOWN STRL REUS W/TWL XL LVL3 (GOWN DISPOSABLE) ×2
KIT BASIN OR (CUSTOM PROCEDURE TRAY) ×2 IMPLANT
KIT ROOM TURNOVER OR (KITS) ×2 IMPLANT
MANIFOLD NEPTUNE II (INSTRUMENTS) ×2 IMPLANT
NS IRRIG 1000ML POUR BTL (IV SOLUTION) ×2 IMPLANT
PACK GENERAL/GYN (CUSTOM PROCEDURE TRAY) ×2 IMPLANT
PAD ARMBOARD 7.5X6 YLW CONV (MISCELLANEOUS) ×2 IMPLANT
SPONGE LAP 18X18 X RAY DECT (DISPOSABLE) IMPLANT
STAPLER VISISTAT 35W (STAPLE) ×2 IMPLANT
STOCKINETTE IMPERVIOUS LG (DRAPES) ×2 IMPLANT
SUT SILK 2 0 (SUTURE) ×1
SUT SILK 2-0 18XBRD TIE 12 (SUTURE) ×1 IMPLANT
SUT VIC AB 1 CTX 27 (SUTURE) ×4 IMPLANT
TOWEL OR 17X26 10 PK STRL BLUE (TOWEL DISPOSABLE) ×2 IMPLANT

## 2016-03-05 NOTE — Anesthesia Procedure Notes (Signed)
Anesthesia Regional Block:  Femoral nerve block  Pre-Anesthetic Checklist: ,, timeout performed, Correct Patient, Correct Site, Correct Laterality, Correct Procedure, Correct Position, site marked, Risks and benefits discussed,  Surgical consent,  Pre-op evaluation,  At surgeon's request and post-op pain management  Laterality: Right  Prep: chloraprep       Needles:  Injection technique: Single-shot  Needle Type: Stimiplex     Needle Length: 9cm 9 cm Needle Gauge: 21 and 21 G    Additional Needles:  Procedures: ultrasound guided (picture in chart) Femoral nerve block Narrative:  Injection made incrementally with aspirations every 5 mL.  Performed by: Personally  Anesthesiologist: Nilda Simmer

## 2016-03-05 NOTE — H&P (Signed)
Rick Lane is an 65 y.o. male.   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 Patient has had progressive dehiscence infection osteomyelitis. Patient recently has had a fever between 101 in the 102 and is bacteremic from his infection despite oral antibiotics.  Past Medical History:  Diagnosis Date  . Asthma   . Cataract   . Depression   . Diabetes mellitus without complication ([place])   . Hyperlipidemia     Past Surgical History:  Procedure Laterality Date  . AMPUTATION Right 12/12/2015   Procedure: Right Transmetatarsal Amputation With [facility] Placement;  Surgeon: [name], MD;  Location: [place];  Service: Orthopedics;  Laterality: Right;  . EYE SURGERY     left eye  . toe removal Right 2012,2014    Family History  Problem Relation Age of Onset  . Heart disease Father   . Diabetes Sister   . Diabetes Brother    Social History:  reports that he has never smoked. He has never used smokeless tobacco. He reports that he does not drink alcohol or use drugs.  Allergies:  Allergies  Allergen Reactions  . Bactrim [Sulfamethoxazole-Trimethoprim] Itching  . Gentamicin     Medications Prior to Admission  Medication Sig Dispense Refill  . atorvastatin (LIPITOR) 20 MG tablet Take 1 tablet (20 mg total) by mouth daily. 90 tablet 3  . B-D INS SYR ULTRAFINE 1CC/30G 30G X 1/2" 1 ML MISC USE 3 TIMES A DAY OR AS DIRECTED TO GIVE INSULIN E11.42  1  . Cholecalciferol (VITAMIN D3) 50000 units CAPS Take 50,000 Units by mouth once a week. 12 capsule 3  . ciprofloxacin (CIPRO) 500 MG tablet Take 500 mg by mouth daily.  0  . doxycycline (VIBRA-TABS) 100 MG tablet TAKE 1 TABLET BY MOUTH TWICE A DAY 20 tablet 0  . glipiZIDE (GLUCOTROL) 10 MG tablet Take 1 tablet (10 mg total) by mouth 2 (two) times daily. 180 tablet 3  . insulin  aspart (NOVOLOG FLEXPEN) 100 UNIT/ML FlexPen 6 to 12 units three times daily with meals 15 mL 11  . Insulin Glargine (LANTUS SOLOSTAR) 100 UNIT/ML Solostar Pen Inject up to 45 units twice a day 30 mL 5  . Insulin Pen Needle 31G X 5 MM MISC Use three times daily 90 each 11  . Insulin Syringes, Disposable, U-100 1 ML MISC Use 3 times a day or as directed to give Insulin 200 each 1  . Multiple Vitamin (MULTIVITAMIN) capsule Take 1 capsule by mouth daily.    . sertraline (ZOLOFT) 50 MG tablet Take 1 tablet (50 mg total) by mouth daily. 90 tablet 3    Results for orders placed or performed during the hospital encounter of 03/05/16 (from the past 48 hour(s))  Glucose, capillary     Status: Abnormal   Collection Time: 03/05/16 11:24 AM  Result Value Ref Range   Glucose-Capillary 178 (H) 65 - 99 mg/dL  Glucose, capillary     Status: Abnormal   Collection Time: 03/05/16  2:35 PM  Result Value Ref Range   Glucose-Capillary 211 (H) 65 - 99 mg/dL   No results found.  Review of Systems  All other systems reviewed and are negative.   Blood pressure 113/88, pulse 85, temperature 97.5 F (36.4 C), resp. rate 10, height 6\' 2"  (1.88 m), SpO2 96 %. Physical Exam  On examination patient's foot is red and swollen  cellulitic he has an ulcer that probes down to bone there is drainage. Assessment/Plan Assessment: Diabetic insensate neuropathy with abscess osteomyelitis ulceration cellulitis right transmetatarsal amputation.  Plan: We'll plan for right transtibial amputation risk and benefits were discussed including risk of the wound not healing. Patient states he understands wishes to proceed at this time.  [name], MD 03/05/2016, 2:54 PM

## 2016-03-05 NOTE — Transfer of Care (Signed)
Immediate Anesthesia Transfer of Care Note  Patient: Rick Lane  Procedure(s) Performed: Procedure(s): RIGHT BELOW KNEE AMPUTATION (Right)  Patient Location: PACU  Anesthesia Type:MAC combined with regional for post-op pain  Level of Consciousness: awake, alert  and oriented  Airway & Oxygen Therapy: Patient Spontanous Breathing  Post-op Assessment: Report given to RN and Post -op Vital signs reviewed and stable  Post vital signs: Reviewed and stable  Last Vitals:  Vitals:   03/05/16 1127 03/05/16 1430  BP: (!) 117/52 113/88  Pulse: 93 85  Resp: 20 10  Temp: 36.8 C 36.4 C    Last Pain: There were no vitals filed for this visit.    Patients Stated Pain Goal: 4 ([id number])  Complications: No apparent anesthesia complications

## 2016-03-05 NOTE — Anesthesia Procedure Notes (Signed)
Anesthesia Regional Block:  Popliteal block  Pre-Anesthetic Checklist: ,, timeout performed, Correct Patient, Correct Site, Correct Laterality, Correct Procedure, Correct Position, site marked, Risks and benefits discussed,  Surgical consent,  Pre-op evaluation,  At surgeon's request and post-op pain management  Laterality: Right  Prep: chloraprep       Needles:   Needle Type: Stimiplex     Needle Length: 9cm 9 cm Needle Gauge: 21 and 21 G    Additional Needles:  Procedures: ultrasound guided (picture in chart) Popliteal block Narrative:  Injection made incrementally with aspirations every 5 mL.  Performed by: Personally  Anesthesiologist: Nilda Simmer

## 2016-03-05 NOTE — Op Note (Signed)
   Date of Surgery: 03/05/2016  INDICATIONS: Rick Lane is a 65 y.o.-year-old male who presents after attempted foot salvage intervention with a transmetatarsal amputation who has progressive cellulitis abscess ulceration osteomyelitis of the right foot.Marland Kitchen  PREOPERATIVE DIAGNOSIS: Abscess osteomyelitis right transmetatarsal amputation with bacteremia.  POSTOPERATIVE DIAGNOSIS: Same.  PROCEDURE: Transtibial amputation Application of Prevena wound VAC  SURGEON: Sharol Given, M.D.  ANESTHESIA:  general  IV FLUIDS AND URINE: See anesthesia.  ESTIMATED BLOOD LOSS: Minimal mL.  COMPLICATIONS: None.  DESCRIPTION OF PROCEDURE: The patient was brought to the operating room and underwent a general anesthetic. After adequate levels of anesthesia were obtained patient's lower extremity was prepped using DuraPrep draped into a sterile field. A timeout was called. The foot was draped out of the sterile field with impervious stockinette. A transverse incision was made 11 cm distal to the tibial tubercle. This curved proximally and a large posterior flap was created. The tibia was transected 1 cm proximal to the skin incision. The fibula was transected just proximal to the tibial incision. The tibia was beveled anteriorly. A large posterior flap was created. The sciatic nerve was pulled cut and allowed to retract. The vascular bundles were suture ligated with 2-0 silk. The deep and superficial fascial layers were closed using #1 Vicryl. The skin was closed using staples and 2-0 nylon. The wound was covered with a Prevena wound VAC. There was a good suction fit. Patient was extubated taken to the PACU in stable condition.  Rick Score, MD [place] 2:39 PM

## 2016-03-05 NOTE — Anesthesia Postprocedure Evaluation (Signed)
Anesthesia Post Note  Patient: Rick Lane  Procedure(s) Performed: Procedure(s) (LRB): RIGHT BELOW KNEE AMPUTATION (Right)  Patient location during evaluation: PACU Anesthesia Type: General Level of consciousness: awake and alert Pain management: pain level controlled Vital Signs Assessment: post-procedure vital signs reviewed and stable Respiratory status: spontaneous breathing, nonlabored ventilation and respiratory function stable Cardiovascular status: blood pressure returned to baseline and stable Postop Assessment: no signs of nausea or vomiting Anesthetic complications: no    Last Vitals:  Vitals:   03/05/16 1500 03/05/16 1515  BP: (!) 111/54 (!) 107/50  Pulse: 82 79  Resp: (!) 9 (!) 6  Temp:      Last Pain:  Vitals:   03/05/16 1500  PainSc: 0-No pain                 [name] A

## 2016-03-05 NOTE — Anesthesia Preprocedure Evaluation (Addendum)
Anesthesia Evaluation  Patient identified by MRN, date of birth, ID band Patient awake    Reviewed: Allergy & Precautions, NPO status , Patient's Chart, lab work & pertinent test results  History of Anesthesia Complications Negative for: history of anesthetic complications  Airway Mallampati: III  TM Distance: >3 FB Neck ROM: Full    Dental  (+) Dental Advisory Given, Teeth Intact   Pulmonary asthma ,    Pulmonary exam normal breath sounds clear to auscultation       Cardiovascular (-) angina(-) Past MI, (-) Cardiac Stents, (-) CABG and (-) Orthopnea negative cardio ROS   Rhythm:Regular Rate:Normal     Neuro/Psych neg Seizures PSYCHIATRIC DISORDERS Depression negative neurological ROS     GI/Hepatic negative GI ROS, Neg liver ROS,   Endo/Other  diabetes, Poorly Controlled, Type 2, Insulin Dependent, Oral Hypoglycemic Agents  Renal/GU negative Renal ROS     Musculoskeletal Osteomyelitis RLE   Abdominal (+) + obese,   Peds  Hematology negative hematology ROS (+)   Anesthesia Other Findings HLD  Reproductive/Obstetrics                           Anesthesia Physical Anesthesia Plan  ASA: III  Anesthesia Plan: Regional   Post-op Pain Management:    Induction: Intravenous  Airway Management Planned: Natural Airway and Nasal Cannula  Additional Equipment:   Intra-op Plan:   Post-operative Plan: Extubation in OR  Informed Consent: I have reviewed the patients History and Physical, chart, labs and discussed the procedure including the risks, benefits and alternatives for the proposed anesthesia with the patient or authorized representative who has indicated his/her understanding and acceptance.   Dental advisory given  Plan Discussed with:   Anesthesia Plan Comments:        Anesthesia Quick Evaluation

## 2016-03-06 LAB — GLUCOSE, CAPILLARY
GLUCOSE-CAPILLARY: 137 mg/dL — AB (ref 65–99)
GLUCOSE-CAPILLARY: 285 mg/dL — AB (ref 65–99)
GLUCOSE-CAPILLARY: 128 mg/dL — AB (ref 65–99)
Glucose-Capillary: 199 mg/dL — ABNORMAL HIGH (ref 65–99)

## 2016-03-06 LAB — HEMOGLOBIN A1C
Hgb A1c MFr Bld: 7.9 % — ABNORMAL HIGH (ref 4.8–5.6)
Mean Plasma Glucose: 180 mg/dL

## 2016-03-06 MED ORDER — SERTRALINE HCL 50 MG PO TABS: 50.0000 mg | ORAL_TABLET | Freq: Every day | ORAL | Status: DC

## 2016-03-06 MED ORDER — SERTRALINE HCL 50 MG PO TABS
50.0000 mg | ORAL_TABLET | Freq: Every day | ORAL | Status: DC
  Administered 2016-03-06 – 2016-03-08 (×3): 50 mg via ORAL
  Filled 2016-03-06 (×3): qty 1

## 2016-03-06 NOTE — Progress Notes (Signed)
Patient ID: Rick Lane, male   DOB: 1951/06/27, 65 y.o.   MRN: [id number] Wound VAC functioning well. Patient has good extension of his knee good range of motion he denies any pain. Plan for discharge to skilled nursing at [facility] on Monday.

## 2016-03-06 NOTE — Plan of Care (Signed)
Problem: Safety: Goal: Ability to remain free from injury will improve Outcome: Progressing No fall or injury noted, safety precautions and fall preventions maintained  Problem: Pain Managment: Goal: General experience of comfort will improve Outcome: Progressing Patient denies pain  Problem: Physical Regulation: Goal: Will remain free from infection Outcome: Progressing No S/S of infection noted, VS WNL  Problem: Skin Integrity: Goal: Risk for impaired skin integrity will decrease Outcome: Progressing Wound vac dressing dry and intact, no drainage noted  Problem: Tissue Perfusion: Goal: Risk factors for ineffective tissue perfusion will decrease Outcome: Progressing No s/s of dvt noted  Problem: Activity: Goal: Risk for activity intolerance will decrease Outcome: Progressing Tolerates bed mobility well  Problem: Bowel/Gastric: Goal: Will not experience complications related to bowel motility Outcome: Progressing No bowel issues noted

## 2016-03-06 NOTE — Evaluation (Signed)
Physical Therapy Evaluation Patient Details Name: Rick Lane MRN: [id number] DOB: 1951/01/19 Today's Date: 03/06/2016   History of Present Illness  Pt is a 65 y/o male s/p R transtibial amputation. PMH including but not limited to DM and R metatarsal amputation (12/12/15).  Clinical Impression  Pt presented supine in bed with HOB elevated, awake and willing to participate in therapy session. Pt participated in OOB mobility with min guard for safety. He moved well during evaluation and has good AROM for flexion and extension of R knee. PT encouraged pt to continue to perform therapeutic exercises and discussed the importance of maintaining good ROM of his R knee. Pt would continue to benefit from skilled physical therapy services at this time while admitted and after d/c to address his below listed limitations in order to improve his overall safety and independence with functional mobility.      Follow Up Recommendations SNF    Equipment Recommendations  None recommended by PT    Recommendations for Other Services       Precautions / Restrictions Precautions Precautions: Fall Restrictions Weight Bearing Restrictions: Yes RLE Weight Bearing: Non weight bearing      Mobility  Bed Mobility Overal bed mobility: Needs Assistance Bed Mobility: Supine to Sit     Supine to sit: Supervision;HOB elevated     General bed mobility comments: pt required increased time to complete  Transfers Overall transfer level: Needs assistance Equipment used: Rolling walker (2 wheeled) Transfers: Sit to/from Stand Sit to Stand: Min guard         General transfer comment: pt required increased time and VC'ing for bilateral hand positioning  Ambulation/Gait Ambulation/Gait assistance: Min guard Ambulation Distance (Feet): 3 Feet Assistive device: Rolling walker (2 wheeled) Gait Pattern/deviations: Step-to pattern (hop-to pattern ) Gait velocity: decreased Gait velocity interpretation:  Below normal speed for age/gender    Stairs            Wheelchair Mobility    Modified Rankin (Stroke Patients Only)       Balance Overall balance assessment: Needs assistance Sitting-balance support: Feet supported;No upper extremity supported Sitting balance-Leahy Scale: Good     Standing balance support: During functional activity;Bilateral upper extremity supported Standing balance-Leahy Scale: Poor                               Pertinent Vitals/Pain Pain Assessment: 0-10 Pain Score: 2  Pain Location: R distal end of residual limb, and R calf phantom limb pain Pain Descriptors / Indicators: Discomfort;Sore Pain Intervention(s): Monitored during session;Repositioned    Home Living Family/patient expects to be discharged to:: Skilled nursing facility                      Prior Function Level of Independence: Independent with assistive device(s)         Comments: pt reported using a RW to ambulate to help with balance     Hand Dominance        Extremity/Trunk Assessment   Upper Extremity Assessment: Overall WFL for tasks assessed           Lower Extremity Assessment: RLE deficits/detail RLE Deficits / Details: pt is NWB R LE; however, he has AROM that is WFL at knee and hip    Cervical / Trunk Assessment: Normal  Communication   Communication: No difficulties  Cognition Arousal/Alertness: Awake/alert Behavior During Therapy: WFL for tasks assessed/performed Overall Cognitive Status: Within Functional  Limits for tasks assessed                      General Comments      Exercises Amputee Exercises Quad Sets: AROM;Strengthening;Right;10 reps;Seated Knee Flexion: AROM;Strengthening;Right;5 reps;Seated Knee Extension: AROM;Strengthening;Right;5 reps;Seated      Assessment/Plan    PT Assessment Patient needs continued PT services  PT Diagnosis Difficulty walking   PT Problem List Decreased  strength;Decreased range of motion;Decreased activity tolerance;Decreased balance;Decreased mobility;Decreased coordination;Decreased knowledge of use of DME;Pain  PT Treatment Interventions DME instruction;Gait training;Functional mobility training;Therapeutic activities;Therapeutic exercise;Balance training;Patient/family education;Stair training   PT Goals (Current goals can be found in the Care Plan section) Acute Rehab PT Goals Patient Stated Goal: go to rehab PT Goal Formulation: With patient Time For Goal Achievement: 03/13/16 Potential to Achieve Goals: Good    Frequency Min 3X/week   Barriers to discharge        Co-evaluation               End of Session Equipment Utilized During Treatment: Gait belt Activity Tolerance: Patient limited by fatigue Patient left: in chair;with call bell/phone within reach Nurse Communication: Mobility status         Time: 1152-1212 PT Time Calculation (min) (ACUTE ONLY): 20 min   Charges:   PT Evaluation $PT Eval Moderate Complexity: 1 Procedure     PT G CodesClearnce Sorrel Jennel Mara 03/06/2016, 1:25 PM Sherie Don, [place], DPT [phone]

## 2016-03-07 LAB — GLUCOSE, CAPILLARY
GLUCOSE-CAPILLARY: 184 mg/dL — AB (ref 65–99)
Glucose-Capillary: 211 mg/dL — ABNORMAL HIGH (ref 65–99)
Glucose-Capillary: 247 mg/dL — ABNORMAL HIGH (ref 65–99)

## 2016-03-07 NOTE — NC FL2 (Signed)
[place] MEDICAID FL2 LEVEL OF CARE SCREENING TOOL     IDENTIFICATION  Patient Name: Rick Lane Birthdate: May 04, 1951 Sex: male Admission Date (Current Location): 03/05/2016  [facility] and [place] Number:  Herbalist and Address:  [facility], [place] [address] [place], [place] [address]      Provider Number: [id number]  Attending Physician Name and Address:  [name], MD  Relative Name and Phone Number:       Current Level of Care: Hospital Recommended Level of Care: [place] Prior Approval Number:    Date Approved/Denied:   PASRR Number:  ([id number])  Discharge Plan: SNF    Current Diagnoses: Patient Active Problem List   Diagnosis Date Noted  . Below knee amputation status ([place]) 03/05/2016  . S/P transmetatarsal amputation of foot ([place]) 12/12/2015  . Healthcare maintenance 11/14/2015  . HLD (hyperlipidemia) 11/14/2015  . DM2 (diabetes mellitus, type 2) ([place]) 08/11/2015  . Diabetic foot ulcer ([place]) 08/11/2015  . Depression 08/11/2015  . Vitamin D deficiency 08/11/2015  . Asthma 08/11/2015    Orientation RESPIRATION BLADDER Height & Weight     Self, Time, Situation, Place  Normal Continent Weight:   Height:  6\' 2"  (188 cm)  BEHAVIORAL SYMPTOMS/MOOD NEUROLOGICAL BOWEL NUTRITION STATUS   (none)  (none) Continent Diet (carb modified)  AMBULATORY STATUS COMMUNICATION OF NEEDS Skin   Extensive Assist Verbally Surgical wounds                       Personal Care Assistance Level of Assistance  Bathing, Feeding, Dressing Bathing Assistance: Limited assistance Feeding assistance: Independent Dressing Assistance: Limited assistance     Functional Limitations Info  Sight, Hearing, Speech Sight Info: Adequate Hearing Info: Adequate Speech Info: Adequate    SPECIAL CARE FACTORS FREQUENCY  PT (By licensed PT)     PT Frequency:  (5x/week)              Contractures Contractures Info: Not  present    Additional Factors Info  Code Status, Allergies Code Status Info:  (full) Allergies Info:  (Bactrim Sulfamethoxazole-trimethoprim, Gentamicin)           Current Medications (03/07/2016):  This is the current hospital active medication list Current Facility-Administered Medications  Medication Dose Route Frequency Provider Last Rate Last Dose  . 0.9 %  sodium chloride infusion   Intravenous Continuous [name], MD      . acetaminophen (TYLENOL) tablet 650 mg  650 mg Oral Q6H PRN [name], MD       Or  . acetaminophen (TYLENOL) suppository 650 mg  650 mg Rectal Q6H PRN [name], MD      . aspirin EC tablet 325 mg  325 mg Oral Daily [name], MD   325 mg at 03/07/16 0950  . HYDROmorphone (DILAUDID) injection 1 mg  1 mg Intravenous Q2H PRN [name], MD      . insulin aspart (novoLOG) injection 0-15 Units  0-15 Units Subcutaneous TID WC [name], MD   3 Units at 03/07/16 0900  . insulin aspart (novoLOG) injection 4 Units  4 Units Subcutaneous TID WC [name], MD   4 Units at 03/07/16 0900  . insulin glargine (LANTUS) injection 20 Units  20 Units Subcutaneous BID [name], MD   20 Units at 03/07/16 [phone]  . methocarbamol (ROBAXIN) tablet 500 mg  500 mg Oral Q6H PRN Beverely Low  Fernanda Drum, MD       Or  . methocarbamol (ROBAXIN) 500 mg in dextrose 5 % 50 mL IVPB  500 mg Intravenous Q6H PRN [name], MD      . metoCLOPramide (REGLAN) tablet 5-10 mg  5-10 mg Oral Q8H PRN [name], MD       Or  . metoCLOPramide (REGLAN) injection 5-10 mg  5-10 mg Intravenous Q8H PRN [name], MD      . ondansetron [facility]) tablet 4 mg  4 mg Oral Q6H PRN [name], MD       Or  . ondansetron [facility]) injection 4 mg  4 mg Intravenous Q6H PRN [name], MD      . oxyCODONE (Oxy IR/ROXICODONE) immediate release tablet 5-10 mg  5-10 mg Oral Q3H PRN [name], MD      . sertraline (ZOLOFT) tablet 50 mg  50 mg Oral Daily [name], MD   50 mg at  03/07/16 [id number]     Discharge Medications: Please see discharge summary for a list of discharge medications.  Relevant Imaging Results:  Relevant Lab Results:   Additional Information ss#:[id number]  Junie Spencer, LCSW

## 2016-03-07 NOTE — Clinical Social Work Note (Signed)
Clinical Social Work Assessment  Patient Details  Name: Rick Lane MRN: [id number] Date of Birth: 1950-08-10  Date of referral:  03/07/16               Reason for consult:  Discharge Planning                Permission sought to share information with:  Facility Sport and exercise psychologist, Case Manager, Family Supports Permission granted to share information::  No  Name::        Agency::   (SNF)  Relationship::     Contact Information:     Housing/Transportation Living arrangements for the past 2 months:  Apartment Source of Information:  Patient, Medical Team Patient Interpreter Needed:  None Criminal Activity/Legal Involvement Pertinent to Current Situation/Hospitalization:  No - Comment as needed Significant Relationships:  Spouse Lives with:  Spouse Do you feel safe going back to the place where you live?  No Need for family participation in patient care:  Yes (Comment)  Care giving concerns: Pt expressed no concerns at this time. Pt agreeable to SNF for higher level of care.    Social Worker assessment / plan: Holiday representative met with pt and discussed CSW role with discharge planning. CSW also explained PT recommendation for SNF. Pt agreeable to SNF for short-term rehab. Pt requested SNF near [place].   Employment status:  Unemployed Forensic scientist:  Other (Comment Required) ([employer] ) PT Recommendations:  [place] / Referral to community resources:  [place]  Patient/Family's Response to care: Pt sitting in recliner resting with CSW entered the room. Pt very pleasant and appears happy with care he is receiving at [facility].   Patient/Family's Understanding of and Emotional Response to Diagnosis, Current Treatment, and Prognosis: Pt appears to have a good understanding of reason for admission into the hospital and with pt care plan.   Emotional Assessment Appearance:  Appears stated age,  Well-Groomed Attitude/Demeanor/Rapport:   (Pleasant) Affect (typically observed):  Accepting, Appropriate, Pleasant, Happy, Hopeful Orientation:  Oriented to Self, Oriented to Place, Oriented to  Time, Oriented to Situation Alcohol / Substance use:  Not Applicable Psych involvement (Current and /or in the community):  No (Comment)  Discharge Needs  Concerns to be addressed:  Discharge Planning Concerns Readmission within the last 30 days:  No Current discharge risk:  None Barriers to Discharge:  Continued Medical Work up   [employer], LCSW 03/07/2016, 11:45 AM

## 2016-03-07 NOTE — Clinical Social Work Placement (Signed)
   CLINICAL SOCIAL WORK PLACEMENT  NOTE  Date:  03/07/2016  Patient Details  Name: Charis Cuozzo MRN: [id number] Date of Birth: 11-30-1950  Clinical Social Work is seeking post-discharge placement for this patient at the [place] level of care (*CSW will initial, date and re-position this form in  chart as items are completed):  Yes   Patient/family provided with [place] Work Department's list of facilities offering this level of care within the geographic area requested by the patient (or if unable, by the patient's family).  Yes   Patient/family informed of their freedom to choose among providers that offer the needed level of care, that participate in Medicare, Medicaid or managed care program needed by the patient, have an available bed and are willing to accept the patient.  Yes   Patient/family informed of [facility]'s ownership interest in [facility] and [facility], as well as of the fact that they are under no obligation to receive care at these facilities.  PASRR submitted to EDS on 03/07/16     PASRR number received on 03/07/16     Existing PASRR number confirmed on       FL2 transmitted to all facilities in geographic area requested by pt/family on 03/07/16     FL2 transmitted to all facilities within larger geographic area on       Patient informed that his/her managed care company has contracts with or will negotiate with certain facilities, including the following:            Patient/family informed of bed offers received.  Patient chooses bed at       Physician recommends and patient chooses bed at      Patient to be transferred to   on  .  Patient to be transferred to facility by       Patient family notified on   of transfer.  Name of family member notified:        PHYSICIAN Please prepare priority discharge summary, including medications, Please prepare prescriptions, Please sign FL2     Additional Comment:     _______________________________________________ Junie Spencer, LCSW 03/07/2016, 12:07 PM

## 2016-03-07 NOTE — Progress Notes (Signed)
Patient ID: Rick Lane, male   DOB: 03/03/1951, 65 y.o.   MRN: [id number] Plan for discharge to skilled nursing on Monday. Patient request [facility]. No change in the drainage in the wound VAC. Patient is resting comfortably.

## 2016-03-08 ENCOUNTER — Encounter (HOSPITAL_COMMUNITY): Payer: Self-pay | Admitting: Orthopedic Surgery

## 2016-03-08 DIAGNOSIS — R2681 Unsteadiness on feet: Secondary | ICD-10-CM | POA: Insufficient documentation

## 2016-03-08 DIAGNOSIS — F331 Major depressive disorder, recurrent, moderate: Secondary | ICD-10-CM | POA: Insufficient documentation

## 2016-03-08 DIAGNOSIS — E119 Type 2 diabetes mellitus without complications: Secondary | ICD-10-CM | POA: Insufficient documentation

## 2016-03-08 DIAGNOSIS — E785 Hyperlipidemia, unspecified: Secondary | ICD-10-CM | POA: Insufficient documentation

## 2016-03-08 DIAGNOSIS — R279 Unspecified lack of coordination: Secondary | ICD-10-CM | POA: Insufficient documentation

## 2016-03-08 DIAGNOSIS — Z9089 Acquired absence of other organs: Secondary | ICD-10-CM | POA: Insufficient documentation

## 2016-03-08 DIAGNOSIS — R269 Unspecified abnormalities of gait and mobility: Secondary | ICD-10-CM | POA: Insufficient documentation

## 2016-03-08 LAB — GLUCOSE, CAPILLARY
Glucose-Capillary: 133 mg/dL — ABNORMAL HIGH (ref 65–99)
Glucose-Capillary: 143 mg/dL — ABNORMAL HIGH (ref 65–99)
Glucose-Capillary: 201 mg/dL — ABNORMAL HIGH (ref 65–99)

## 2016-03-08 MED ORDER — OXYCODONE-ACETAMINOPHEN 5-325 MG PO TABS: 1.0000 | ORAL_TABLET | ORAL | 0 refills | Status: DC | PRN

## 2016-03-08 NOTE — Progress Notes (Signed)
Patient has been switched over to the [place] wound vac.

## 2016-03-08 NOTE — Progress Notes (Signed)
This RN called report to Therapist, sports at [facility] at [phone]

## 2016-03-08 NOTE — Discharge Summary (Signed)
Physician Discharge Summary  Patient ID: Rick Lane MRN: [id number] DOB/AGE: 64-Feb-1952 64 y.o.  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 He underwent a transtibial amputation. Postoperatively patient progressed slowly was discharged to skilled nursing facility.  Consults: None  Significant Diagnostic Studies: labs: Routine labs  Treatments: surgery: See operative note  Discharge Exam: Blood pressure (!) 126/55, pulse 79, temperature 97.6 F (36.4 C), temperature source Oral, resp. rate 18, height 6\' 2"  (1.88 m), SpO2 98 %. Incision/Wound: Wound VAC dressing clean and dry  Disposition: 01-Home or Self Care  Discharge Instructions    Call MD / Call 911    Complete by:  As directed   If you experience chest pain or shortness of breath, CALL 911 and be transported to the hospital emergency room.  If you develope a fever above 101 F, pus (white drainage) or increased drainage or redness at the wound, or calf pain, call your surgeon's office.   Constipation Prevention    Complete by:  As directed   Drink plenty of fluids.  Prune juice may be helpful.  You may use a stool softener, such as Colace (over the counter) 100 mg twice a day.  Use MiraLax (over the counter) for constipation as needed.   Diet - low sodium heart healthy    Complete by:  As directed   Increase activity slowly as tolerated    Complete by:  As directed   Neg Press Wound Therapy / Incisional    Complete by:  As directed   When the wound VAC stops working or alarms please remove the VAC and dressing and start dry dressing changes daily   Non weight bearing    Complete by:  As directed   Laterality:  right   Extremity:  Lower        Medication List    STOP taking these medications   ciprofloxacin 500 MG tablet Commonly known as:  CIPRO   doxycycline 100 MG tablet Commonly known as:  VIBRA-TABS     TAKE these medications   atorvastatin 20 MG tablet Commonly known as:  LIPITOR Take 1 tablet (20 mg total) by mouth daily.   B-D INS SYR ULTRAFINE 1CC/30G 30G X 1/2" 1 ML Misc Generic drug:  Insulin Syringe-Needle U-100 USE 3 TIMES A DAY OR AS DIRECTED TO GIVE INSULIN E11.42   glipiZIDE 10 MG tablet Commonly known as:  GLUCOTROL Take 1 tablet (10 mg total) by mouth 2 (two) times daily.   insulin aspart 100 UNIT/ML FlexPen Commonly known as:  NOVOLOG FLEXPEN 6 to 12 units three times daily with meals   Insulin Glargine 100 UNIT/ML Solostar Pen Commonly known as:  LANTUS SOLOSTAR Inject up to 45 units twice a day   Insulin Pen Needle 31G X 5 MM Misc Use three times daily   Insulin Syringes (Disposable) U-100 1 ML Misc Use 3 times a day or as directed to give Insulin   multivitamin capsule Take 1 capsule by mouth daily.   oxyCODONE-acetaminophen 5-325 MG tablet Commonly known as:  ROXICET Take 1 tablet by mouth every 4 (four) hours as needed for severe pain.   sertraline 50 MG tablet Commonly known as:  ZOLOFT Take 1 tablet (50 mg total) by mouth daily.   Vitamin D3 50000 units Caps Take 50,000  Units by mouth once a week.      Follow-up Information    Ebony Rickel V, MD Follow up in 2 week(s).   Specialty:  Orthopedic Surgery Contact information: [place] [address] [phone]           Signed: [name] 03/08/2016, 6:20 AM

## 2016-03-08 NOTE — Progress Notes (Signed)
Reviewed d/c instructions and home medications with pt. IV taken out with no complications. Will be d/c'ed to [facility] in [place] later this afternoon. Will call report when contact info is given.  Jaymes Graff, RN

## 2016-03-08 NOTE — Clinical Social Work Placement (Signed)
   CLINICAL SOCIAL WORK PLACEMENT  NOTE  Date:  03/08/2016  Patient Details  Name: Rick Lane MRN: [id number] Date of Birth: [birth year]  Clinical Social Work is seeking post-discharge placement for this patient at the [place] level of care (*CSW will initial, date and re-position this form in  chart as items are completed):  Yes   Patient/family provided with [place] Work Department's list of facilities offering this level of care within the geographic area requested by the patient (or if unable, by the patient's family).  Yes   Patient/family informed of their freedom to choose among providers that offer the needed level of care, that participate in Medicare, Medicaid or managed care program needed by the patient, have an available bed and are willing to accept the patient.  Yes   Patient/family informed of [facility]'s ownership interest in [facility] and [facility], as well as of the fact that they are under no obligation to receive care at these facilities.  PASRR submitted to EDS on 03/07/16     PASRR number received on 03/07/16     Existing PASRR number confirmed on       FL2 transmitted to all facilities in geographic area requested by pt/family on 03/07/16     FL2 transmitted to all facilities within larger geographic area on       Patient informed that his/her managed care company has contracts with or will negotiate with certain facilities, including the following:        Yes   Patient/family informed of bed offers received.  Patient chooses bed at  [facility])     Physician recommends and patient chooses bed at      Patient to be transferred to  [facility]) on 03/08/16.  Patient to be transferred to facility by  [facility])     Patient family notified on 03/08/16 of transfer.  Name of family member notified:   (Patient)     PHYSICIAN Please prepare priority discharge summary, including medications,  Please prepare prescriptions, Please sign FL2     Additional Comment:    _______________________________________________ Bernita Buffy 03/08/2016, 12:54 PM

## 2016-03-08 NOTE — Progress Notes (Signed)
Went to OR and picked up [place] wound vac for patient.

## 2016-03-08 NOTE — Progress Notes (Signed)
Social Worker met with patient at bedside. The pt will be d/c to [facility] via [facility]. RN to call report to [phone].   Tilda Burrow, Social Worker [phone]

## 2016-03-22 ENCOUNTER — Telehealth: Payer: Self-pay

## 2016-03-22 NOTE — Telephone Encounter (Signed)
[facility] called stating last month patient was on 100mg  zoloft and we recent sent an rx for 50mg . Patient was not aware that it had been changed. Dr.Bradshaw approved patient on 100mg  zoloft daily 90 with 1 refill.

## 2016-03-25 ENCOUNTER — Ambulatory Visit: Payer: BLUE CROSS/BLUE SHIELD | Admitting: Family Medicine

## 2016-03-26 ENCOUNTER — Ambulatory Visit (INDEPENDENT_AMBULATORY_CARE_PROVIDER_SITE_OTHER): Payer: BLUE CROSS/BLUE SHIELD | Admitting: Family Medicine

## 2016-03-26 ENCOUNTER — Encounter: Payer: Self-pay | Admitting: Family Medicine

## 2016-03-26 VITALS — BP 127/73 | HR 90 | Temp 97.6°F

## 2016-03-26 DIAGNOSIS — E1142 Type 2 diabetes mellitus with diabetic polyneuropathy: Secondary | ICD-10-CM | POA: Diagnosis not present

## 2016-03-26 DIAGNOSIS — Z89511 Acquired absence of right leg below knee: Secondary | ICD-10-CM

## 2016-03-26 DIAGNOSIS — Z794 Long term (current) use of insulin: Secondary | ICD-10-CM

## 2016-03-26 DIAGNOSIS — L97529 Non-pressure chronic ulcer of other part of left foot with unspecified severity: Secondary | ICD-10-CM

## 2016-03-26 DIAGNOSIS — E11621 Type 2 diabetes mellitus with foot ulcer: Secondary | ICD-10-CM | POA: Diagnosis not present

## 2016-03-26 MED ORDER — MUPIROCIN 2 % EX OINT: 1.0000 "application " | TOPICAL_OINTMENT | Freq: Two times a day (BID) | CUTANEOUS | 0 refills | Status: DC

## 2016-03-26 NOTE — Patient Instructions (Addendum)
Great to see you!  Come back to me in 3 months unless you need [place] sooner.   Please get help right away if there are changes of the stump  Please keep mupirocin and a clean bandage on the L toe with the small ulcer

## 2016-03-26 NOTE — Progress Notes (Signed)
   HPI  Patient presents today here to follow-up after being discharged from the nursing home.  Patient had a below the knee amputation for osteomyelitis on 03/08/2016. He states that in the last week he's got progression of a hole in the wound of his right BKA stump. That has recently been evaluated by orthopedics again and he was started on doxycycline. He denies any fevers, chills, sweats, and he is breathing easily.  Diabetes Blood sugars have been ranging from 125-175 on average. He's taking 40 units of Lantus twice daily, he's taking 10 units of NovoLog 3 times a day with meals. No hypoglycemia.  Left foot ulcer They have not noticed this previous to the exam No pain or drainage.  PMH: Smoking status noted ROS: Per HPI  Objective: BP 127/73 (BP Location: Left Arm, Patient Position: Sitting, Cuff Size: Normal)   Pulse 90   Temp 97.6 F (36.4 C) (Oral)  Gen: NAD, alert, cooperative with exam HEENT: NCAT CV: RRR, good S1/S2, no murmur Resp: CTABL, no wheezes, non-labored Ext: No edema, warm Neuro: Alert and oriented, No gross deficits  Diabetic Foot Exam - Simple   Simple Foot Form Visual Inspection See comments:  Yes Sensation Testing See comments:  Yes Pulse Check Posterior Tibialis and Dorsalis pulse intact bilaterally:  Yes Comments Right-sided BKA With 3 mm x 5 mm small shallow ulcer on the medial second toe       Assessment and plan:  # 2 diabetes Well-controlled since leaving the nursing home Encouraged him to continue his current regimen. When he's better controlled like to discontinue glipizide and consider adding a GLP-1  Follow-up 3 months  #  BKA Struggling with wound healing currently, currently being treated with doxycycline followed by orthopedics Encouraged threshold for follow-up and reviewed reasons to be concerned about worsening infection Rechecking CBC  # Left foot diabetic ulcer Small 3 X5  mm ulcer Emphasized the importance of  taking care of his remaining foot Mupirocin and routine dressing for now.    Orders Placed This Encounter  Procedures  . CBC with Differential  . CMP14+EGFR    Meds ordered this encounter  Medications  . doxycycline (VIBRA-TABS) 100 MG tablet    Refill:  0  . mupirocin ointment (BACTROBAN) 2 %    Sig: Place 1 application into the nose 2 (two) times daily.    Dispense:  22 g    Refill:  [place], MD [facility] 03/26/2016, 10:44 AM

## 2016-03-27 LAB — CMP14+EGFR
ALBUMIN: 4 g/dL (ref 3.6–4.8)
ALK PHOS: 57 IU/L (ref 39–117)
ALT: 20 IU/L (ref 0–44)
AST: 21 IU/L (ref 0–40)
Albumin/Globulin Ratio: 1.5 (ref 1.2–2.2)
BUN/Creatinine Ratio: 16 (ref 10–24)
BUN: 16 mg/dL (ref 8–27)
Bilirubin Total: 0.3 mg/dL (ref 0.0–1.2)
CALCIUM: 9.6 mg/dL (ref 8.6–10.2)
CO2: 21 mmol/L (ref 18–29)
CREATININE: 1.02 mg/dL (ref 0.76–1.27)
Chloride: 102 mmol/L (ref 96–106)
GFR calc Af Amer: 89 mL/min/{1.73_m2} (ref >59)
GFR calc non Af Amer: 77 mL/min/{1.73_m2} (ref >59)
GLUCOSE: 141 mg/dL — AB (ref 65–99)
Globulin, Total: 2.6 g/dL (ref 1.5–4.5)
Potassium: 4.6 mmol/L (ref 3.5–5.2)
Sodium: 141 mmol/L (ref 134–144)
Total Protein: 6.6 g/dL (ref 6.0–8.5)

## 2016-03-27 LAB — CBC WITH DIFFERENTIAL/PLATELET
BASOS ABS: 0 10*3/uL (ref 0.0–0.2)
Basos: 0 %
EOS (ABSOLUTE): 0.3 10*3/uL (ref 0.0–0.4)
Eos: 5 %
HEMOGLOBIN: 10.7 g/dL — AB (ref 12.6–17.7)
Hematocrit: 33.3 % — ABNORMAL LOW (ref 37.5–51.0)
IMMATURE GRANULOCYTES: 0 %
Immature Grans (Abs): 0 10*3/uL (ref 0.0–0.1)
LYMPHS ABS: 1.2 10*3/uL (ref 0.7–3.1)
Lymphs: 23 %
MCH: 27.1 pg (ref 26.6–33.0)
MCHC: 32.1 g/dL (ref 31.5–35.7)
MCV: 84 fL (ref 79–97)
MONOCYTES: 8 %
Monocytes Absolute: 0.4 10*3/uL (ref 0.1–0.9)
NEUTROS PCT: 64 %
Neutrophils Absolute: 3.4 10*3/uL (ref 1.4–7.0)
Platelets: 303 10*3/uL (ref 150–379)
RBC: 3.95 x10E6/uL — AB (ref 4.14–5.80)
RDW: 15.8 % — AB (ref 12.3–15.4)
WBC: 5.3 10*3/uL (ref 3.4–10.8)

## 2016-05-11 ENCOUNTER — Ambulatory Visit (INDEPENDENT_AMBULATORY_CARE_PROVIDER_SITE_OTHER): Payer: BLUE CROSS/BLUE SHIELD | Admitting: Orthopedic Surgery

## 2016-05-12 ENCOUNTER — Ambulatory Visit (INDEPENDENT_AMBULATORY_CARE_PROVIDER_SITE_OTHER): Payer: BLUE CROSS/BLUE SHIELD | Admitting: Orthopedic Surgery

## 2016-05-20 ENCOUNTER — Encounter (INDEPENDENT_AMBULATORY_CARE_PROVIDER_SITE_OTHER): Payer: Self-pay | Admitting: Family

## 2016-05-20 ENCOUNTER — Ambulatory Visit (INDEPENDENT_AMBULATORY_CARE_PROVIDER_SITE_OTHER): Payer: BLUE CROSS/BLUE SHIELD | Admitting: Family

## 2016-05-20 VITALS — Ht 74.0 in | Wt 280.0 lb

## 2016-05-20 DIAGNOSIS — Z89511 Acquired absence of right leg below knee: Secondary | ICD-10-CM

## 2016-05-20 NOTE — Progress Notes (Addendum)
Office Visit Note   Patient: Rick Lane           Date of Birth: [birth year]           MRN: [id number] Visit Date: 05/20/2016              Requested by: [name], MD [place], [place] [address] PCP: Kenn File, MD   Assessment & Plan: Visit Diagnoses:  1. Status post below knee amputation of right lower extremity ([place])     Plan: Continue by sock packing to wound daily continue shrinker daily follow with [facility] for prosthesis set up  Follow-Up Instructions: Return in about 4 weeks (around 06/17/2016).   Orders:  No orders of the defined types were placed in this encounter.  No orders of the defined types were placed in this encounter.     Procedures: No procedures performed   Clinical Data: No additional findings.      Subjective: Chief Complaint  Patient presents with  . Right Leg - Routine Post Op    03/05/16 right transtibial amputation    Patient is status post right transtibial amputation 03/05/16. He has wound currently lateral residual limb, which shows excellent improvement. He is packing wound with medicated Vive compression stocking with Vive compression shrinker. Patient states their dog ate their other shrinker and are following up with [facility] for new shrinker and casting for prosthetic. He denies any pain, and is pleased with his improvement. There is good granulation tissue, minimal fibronous exudate without odor. Wound measures 3.5cm x 2.3cm.     Review of Systems  Constitutional: Negative for chills and fever.     Objective: Vital Signs: Ht 6\' 2"  (1.88 m)   Wt 280 lb (127 kg)   BMI 35.95 kg/m   Physical Exam  Ortho Exam   Residual limb has consolidated well. Lateral ulcer shows excellent improvement since last visit.See attached images for wound details.  Specialty Comments:  No specialty comments available.  Imaging: No results found.   PMFS History: Patient Active Problem List   Diagnosis Date  Noted  . Below knee amputation status ([place]) 03/05/2016  . Healthcare maintenance 11/14/2015  . HLD (hyperlipidemia) 11/14/2015  . DM2 (diabetes mellitus, type 2) ([place]) 08/11/2015  . Diabetic foot ulcer ([place]) 08/11/2015  . Depression 08/11/2015  . Vitamin D deficiency 08/11/2015  . Asthma 08/11/2015   Past Medical History:  Diagnosis Date  . Asthma   . Cataract   . Depression   . Diabetes mellitus without complication ([place])   . Hyperlipidemia     Family History  Problem Relation Age of Onset  . Heart disease Father   . Diabetes Sister   . Diabetes Brother     Past Surgical History:  Procedure Laterality Date  . AMPUTATION Right 12/12/2015   Procedure: Right Transmetatarsal Amputation With [facility] Placement;  Surgeon: [name], MD;  Location: [facility];  Service: Orthopedics;  Laterality: Right;  . AMPUTATION Right 03/05/2016   Procedure: RIGHT BELOW KNEE AMPUTATION;  Surgeon: [name], MD;  Location: [facility];  Service: Orthopedics;  Laterality: Right;  . EYE SURGERY     left eye  . toe removal Right 2012,2014   Social History   Occupational History  . Not on file.   Social History Main Topics  . Smoking status: Never Smoker  . Smokeless tobacco: Never Used  . Alcohol use No  . Drug use: No  . Sexual activity: Not  on file

## 2016-06-24 ENCOUNTER — Ambulatory Visit (INDEPENDENT_AMBULATORY_CARE_PROVIDER_SITE_OTHER): Payer: BLUE CROSS/BLUE SHIELD | Admitting: Orthopedic Surgery

## 2016-06-24 ENCOUNTER — Encounter (INDEPENDENT_AMBULATORY_CARE_PROVIDER_SITE_OTHER): Payer: Self-pay | Admitting: Family

## 2016-06-24 ENCOUNTER — Ambulatory Visit (INDEPENDENT_AMBULATORY_CARE_PROVIDER_SITE_OTHER): Payer: BLUE CROSS/BLUE SHIELD | Admitting: Family

## 2016-06-24 VITALS — Ht 74.0 in | Wt 280.0 lb

## 2016-06-24 DIAGNOSIS — L97211 Non-pressure chronic ulcer of right calf limited to breakdown of skin: Secondary | ICD-10-CM

## 2016-06-24 DIAGNOSIS — Z89511 Acquired absence of right leg below knee: Secondary | ICD-10-CM

## 2016-06-24 NOTE — Progress Notes (Signed)
Office Visit Note   Patient: Rick Lane           Date of Birth: 02/29/1951           MRN: [id number] Visit Date: 06/24/2016              Requested by: [name], MD [place], [place] [address] PCP: Kenn File, MD   Assessment & Plan: Visit Diagnoses:  1. Status post below knee amputation of right lower extremity ([place])   2. Skin ulcer of right calf, limited to breakdown of skin ([place])     Plan: Continue with mupirocin ointment dressings to the ulcer daily. Have advised him to minimize wear of the prosthetic until wound is healed. Once wound healed is to proceed with gait training.  Follow-Up Instructions: Return in about 1 month (around 07/24/2016).   Orders:  No orders of the defined types were placed in this encounter.  No orders of the defined types were placed in this encounter.     Procedures: No procedures performed   Clinical Data: No additional findings.   Subjective: Chief Complaint  Patient presents with  . Right Leg - Follow-up    03/05/16 right below the knee amputation    Patient is 3 months s/p a right below the knee amputation. He ambulates today with a walker wearing his prosthetic that was fabricated by [facility] prosthetic.     Review of Systems  Constitutional: Negative for chills and fever.  Skin: Positive for wound.     Objective: Vital Signs: Ht 6\' 2"  (1.88 m)   Wt 280 lb (127 kg)   BMI 35.95 kg/m   Physical Exam  Constitutional: He is oriented to person, place, and time. He appears well-developed and well-nourished.  Pulmonary/Chest: Effort normal.  Musculoskeletal:  Right residual limb status post below the knee amputation is well consolidated incision is well-healed.  Neurological: He is alert and oriented to person, place, and time.  Skin:  Does have a 5 mm in diameter ulceration over the lateral right residual limb. This has no depth. No drainage. No surrounding erythema no sign of infection.    Psychiatric: He has a normal mood and affect.  Nursing note reviewed.   Ortho Exam  Specialty Comments:  No specialty comments available.  Imaging: No results found.   PMFS History: Patient Active Problem List   Diagnosis Date Noted  . Below knee amputation status ([place]) 03/05/2016  . Healthcare maintenance 11/14/2015  . HLD (hyperlipidemia) 11/14/2015  . DM2 (diabetes mellitus, type 2) ([place]) 08/11/2015  . Diabetic foot ulcer ([place]) 08/11/2015  . Depression 08/11/2015  . Vitamin D deficiency 08/11/2015  . Asthma 08/11/2015   Past Medical History:  Diagnosis Date  . Asthma   . Cataract   . Depression   . Diabetes mellitus without complication ([place])   . Hyperlipidemia     Family History  Problem Relation Age of Onset  . Heart disease Father   . Diabetes Sister   . Diabetes Brother     Past Surgical History:  Procedure Laterality Date  . AMPUTATION Right 12/12/2015   Procedure: Right Transmetatarsal Amputation With [facility] Placement;  Surgeon: [name], MD;  Location: [facility];  Service: Orthopedics;  Laterality: Right;  . AMPUTATION Right 03/05/2016   Procedure: RIGHT BELOW KNEE AMPUTATION;  Surgeon: [name], MD;  Location: [facility];  Service: Orthopedics;  Laterality: Right;  . EYE SURGERY     left eye  .  toe removal Right 2012,2014   Social History   Occupational History  . Not on file.   Social History Main Topics  . Smoking status: Never Smoker  . Smokeless tobacco: Never Used  . Alcohol use No  . Drug use: No  . Sexual activity: Not on file

## 2016-06-29 ENCOUNTER — Encounter: Payer: Self-pay | Admitting: Family Medicine

## 2016-06-29 ENCOUNTER — Ambulatory Visit (INDEPENDENT_AMBULATORY_CARE_PROVIDER_SITE_OTHER): Payer: BLUE CROSS/BLUE SHIELD | Admitting: Family Medicine

## 2016-06-29 VITALS — BP 121/59 | HR 91 | Temp 97.5°F

## 2016-06-29 DIAGNOSIS — E1142 Type 2 diabetes mellitus with diabetic polyneuropathy: Secondary | ICD-10-CM

## 2016-06-29 DIAGNOSIS — E785 Hyperlipidemia, unspecified: Secondary | ICD-10-CM | POA: Diagnosis not present

## 2016-06-29 DIAGNOSIS — Z89511 Acquired absence of right leg below knee: Secondary | ICD-10-CM | POA: Diagnosis not present

## 2016-06-29 DIAGNOSIS — Z794 Long term (current) use of insulin: Secondary | ICD-10-CM | POA: Diagnosis not present

## 2016-06-29 LAB — BAYER DCA HB A1C WAIVED: HB A1C (BAYER DCA - WAIVED): 6.8 % (ref <7.0)

## 2016-06-29 NOTE — Progress Notes (Signed)
   HPI  Patient presents today here for routine follow-up of type 2 diabetes hyperlipidemia  Patient is doing well after his last amputation, he is adjusting well to his orthotic and walking with a walker. At home he can walk without the walker. He has moderate balance.  Type 2 diabetes He has cut back to 35 units in the morning and 30 units at night of Lantus, he has had a few episodes of blood sugars and these Average fasting is 100-120  Hyperlipidemia Watching his diet well, mainly focused on reducing carbohydrate intake. Good medication compliance   PMH: Smoking status noted ROS: Per HPI  Objective: BP (!) 121/59   Pulse 91   Temp 97.5 F (36.4 C) (Oral)  Gen: NAD, alert, cooperative with exam HEENT: NCAT CV: RRR, good S1/S2, no murmur Resp: CTABL, no wheezes, non-labored Ext: Right below the knee amputation with orthotic in place Neuro: Alert and oriented, No gross deficits  Diabetic Foot Exam - Simple   Simple Foot Form Visual Inspection See comments:  Yes Sensation Testing See comments:  Yes Pulse Check See comments:  Yes Comments Sensation absent to approximately 8 inches above the malleolus on left lower extremity  2+ dorsalis pedis pulses Right lower extremity with below the knee amputation and orthotic in place      Assessment and plan:  # Type 2 diabetes Control improving, likely due to removal of his chronic foot wound. Recommended decreasing Lantus dose to 30 units twice daily Continue 10 units of NovoLog per meal. Continue glipizide for now  # HLD Direct LDL, Non fasting Goal less than 100  # S/p  BKA Doing well with prosthesis Continue to follow as needed  Orders Placed This Encounter  Procedures  . Bayer DCA Hb A1c Waived  . LDL Cholesterol, Direct  . CMP14+EGFR  . CBC with Differential/Platelet  . Ambulatory referral to Ophthalmology    Referral Priority:   Routine    Referral Type:   Consultation    Referral Reason:    Specialty Services Required    Requested Specialty:   Ophthalmology    Number of Visits Requested:   [place], MD [place] 06/29/2016, 1:45 PM

## 2016-06-29 NOTE — Patient Instructions (Signed)
Great to see you!  Decrease lantu to 30 units twice daily  Come back in 3 months., keep up the good work, your diabetes is very well managed!

## 2016-06-30 LAB — LDL CHOLESTEROL, DIRECT: LDL DIRECT: 73 mg/dL (ref 0–99)

## 2016-06-30 LAB — CMP14+EGFR
A/G RATIO: 1.9 (ref 1.2–2.2)
ALBUMIN: 4.4 g/dL (ref 3.6–4.8)
ALT: 39 IU/L (ref 0–44)
AST: 30 IU/L (ref 0–40)
Alkaline Phosphatase: 59 IU/L (ref 39–117)
BILIRUBIN TOTAL: 0.3 mg/dL (ref 0.0–1.2)
BUN / CREAT RATIO: 18 (ref 10–24)
BUN: 21 mg/dL (ref 8–27)
CHLORIDE: 102 mmol/L (ref 96–106)
CO2: 24 mmol/L (ref 18–29)
Calcium: 9.7 mg/dL (ref 8.6–10.2)
Creatinine, Ser: 1.2 mg/dL (ref 0.76–1.27)
GFR, EST AFRICAN AMERICAN: 73 mL/min/{1.73_m2} (ref >59)
GFR, EST NON AFRICAN AMERICAN: 64 mL/min/{1.73_m2} (ref >59)
GLOBULIN, TOTAL: 2.3 g/dL (ref 1.5–4.5)
Glucose: 160 mg/dL — ABNORMAL HIGH (ref 65–99)
POTASSIUM: 5 mmol/L (ref 3.5–5.2)
SODIUM: 144 mmol/L (ref 134–144)
TOTAL PROTEIN: 6.7 g/dL (ref 6.0–8.5)

## 2016-06-30 LAB — CBC WITH DIFFERENTIAL/PLATELET
BASOS ABS: 0 10*3/uL (ref 0.0–0.2)
BASOS: 1 %
EOS (ABSOLUTE): 0.2 10*3/uL (ref 0.0–0.4)
Eos: 4 %
Hematocrit: 40.3 % (ref 37.5–51.0)
Hemoglobin: 13.3 g/dL (ref 13.0–17.7)
Immature Grans (Abs): 0 10*3/uL (ref 0.0–0.1)
Immature Granulocytes: 0 %
LYMPHS ABS: 1.5 10*3/uL (ref 0.7–3.1)
Lymphs: 24 %
MCH: 27.1 pg (ref 26.6–33.0)
MCHC: 33 g/dL (ref 31.5–35.7)
MCV: 82 fL (ref 79–97)
Monocytes Absolute: 0.5 10*3/uL (ref 0.1–0.9)
Monocytes: 8 %
NEUTROS ABS: 4.1 10*3/uL (ref 1.4–7.0)
Neutrophils: 63 %
PLATELETS: 203 10*3/uL (ref 150–379)
RBC: 4.9 x10E6/uL (ref 4.14–5.80)
RDW: 17.7 % — ABNORMAL HIGH (ref 12.3–15.4)
WBC: 6.4 10*3/uL (ref 3.4–10.8)

## 2016-07-23 ENCOUNTER — Encounter (INDEPENDENT_AMBULATORY_CARE_PROVIDER_SITE_OTHER): Payer: Self-pay | Admitting: Orthopedic Surgery

## 2016-07-23 ENCOUNTER — Ambulatory Visit (INDEPENDENT_AMBULATORY_CARE_PROVIDER_SITE_OTHER): Payer: BLUE CROSS/BLUE SHIELD | Admitting: Family

## 2016-07-23 DIAGNOSIS — L97211 Non-pressure chronic ulcer of right calf limited to breakdown of skin: Secondary | ICD-10-CM | POA: Diagnosis not present

## 2016-07-23 DIAGNOSIS — Z89511 Acquired absence of right leg below knee: Secondary | ICD-10-CM

## 2016-07-23 NOTE — Progress Notes (Signed)
Office Visit Note   Patient: Rick Lane           Date of Birth: 1951-02-21           MRN: [id number] Visit Date: 07/23/2016              Requested by: [name], MD [place],  [id number] PCP: Kenn File, MD   Assessment & Plan: Visit Diagnoses:  1. Status post below knee amputation of right lower extremity ([place])   2. Non-pressure chronic ulcer of right calf, limited to breakdown of skin ([place])     Plan: Recommended firmly that the patient discontinue use of his prosthetic until this ulcer has healed. Was provided with some Tegaderm dressings to use when he does wear the prosthetic. We'll cleanse the wound daily use antibacterial ointment and a Band-Aid until healed.  Follow-Up Instructions: Return in about 4 weeks (around 08/20/2016).   Orders:  No orders of the defined types were placed in this encounter.  No orders of the defined types were placed in this encounter.     Procedures: No procedures performed   Clinical Data: No additional findings.   Subjective: Chief Complaint  Patient presents with  . Right Knee - Wound Check    RT BKA 03/05/16 5 months post op    Patient is a 65 year old gentleman who presents for one month follow up of right transtibial amputation with wound. There is some redness, there is minimal drainage. There is no odor. He states he is in his prosthetic approximately 12 hours a day. He is wearing bactroban dressing daily, and places a band aid to protect end bearing area of amputation. He does have intermittent swelling and wears the shrinker for this in the evenings.     Review of Systems  Constitutional: Negative for chills and fever.  Skin: Positive for wound.     Objective: Vital Signs: There were no vitals taken for this visit.  Physical Exam  Constitutional: He is oriented to person, place, and time. He appears well-developed and well-nourished.  Pulmonary/Chest: Effort normal.  Musculoskeletal:    Right transtibial amputation is well healed well consolidated. Laterally there is a ulceration from weightbearing. This is approximately 15 mm x 3 mm with some surrounding erythema. There is no drainage no odor no ascending cellulitis no sign of infection.  Neurological: He is alert and oriented to person, place, and time.  Psychiatric: He has a normal mood and affect.  Nursing note reviewed.   Ortho Exam  Specialty Comments:  No specialty comments available.  Imaging: No results found.   PMFS History: Patient Active Problem List   Diagnosis Date Noted  . Non-pressure chronic ulcer of right calf, limited to breakdown of skin ([place]) 07/23/2016  . Below knee amputation status ([place]) 03/05/2016  . Healthcare maintenance 11/14/2015  . HLD (hyperlipidemia) 11/14/2015  . DM2 (diabetes mellitus, type 2) ([place]) 08/11/2015  . Depression 08/11/2015  . Vitamin D deficiency 08/11/2015  . Asthma 08/11/2015   Past Medical History:  Diagnosis Date  . Asthma   . Cataract   . Depression   . Diabetes mellitus without complication ([place])   . Hyperlipidemia     Family History  Problem Relation Age of Onset  . Heart disease Father   . Diabetes Sister   . Diabetes Brother     Past Surgical History:  Procedure Laterality Date  . AMPUTATION Right 12/12/2015   Procedure: Right Transmetatarsal Amputation With [facility] Placement;  Surgeon: [name], MD;  Location: [place];  Service: Orthopedics;  Laterality: Right;  . AMPUTATION Right 03/05/2016   Procedure: RIGHT BELOW KNEE AMPUTATION;  Surgeon: [name], MD;  Location: [place];  Service: Orthopedics;  Laterality: Right;  . EYE SURGERY     left eye  . toe removal Right 2012,2014   Social History   Occupational History  . Not on file.   Social History Main Topics  . Smoking status: Never Smoker  . Smokeless tobacco: Never Used  . Alcohol use No  . Drug use: No  . Sexual activity: Not on file

## 2016-08-02 LAB — HM DIABETES EYE EXAM

## 2016-08-24 ENCOUNTER — Ambulatory Visit (INDEPENDENT_AMBULATORY_CARE_PROVIDER_SITE_OTHER): Payer: BLUE CROSS/BLUE SHIELD | Admitting: Orthopedic Surgery

## 2016-08-24 ENCOUNTER — Encounter (INDEPENDENT_AMBULATORY_CARE_PROVIDER_SITE_OTHER): Payer: Self-pay | Admitting: Orthopedic Surgery

## 2016-08-24 DIAGNOSIS — E1142 Type 2 diabetes mellitus with diabetic polyneuropathy: Secondary | ICD-10-CM

## 2016-08-24 DIAGNOSIS — Z794 Long term (current) use of insulin: Secondary | ICD-10-CM

## 2016-08-24 DIAGNOSIS — L97211 Non-pressure chronic ulcer of right calf limited to breakdown of skin: Secondary | ICD-10-CM

## 2016-08-24 DIAGNOSIS — Z89511 Acquired absence of right leg below knee: Secondary | ICD-10-CM

## 2016-08-24 NOTE — Progress Notes (Signed)
Office Visit Note   Patient: Rick Lane           Date of Birth: [birth year]           MRN: [id number] Visit Date: 08/24/2016              Requested by: [name], MD [place], [place] [id number] PCP: Kenn File, MD   Assessment & Plan: Visit Diagnoses:  1. S/P unilateral BKA (below knee amputation), right ([place])   2. Non-pressure chronic ulcer of right calf, limited to breakdown of skin ([place])   3. Type 2 diabetes mellitus with diabetic polyneuropathy, with long-term current use of insulin ([place])     Plan: Follow-up as needed. Patient has venous stasis insufficiency changes in the left leg and recommended compression stockings for the left leg. He has a small blister over the lateral aspect of the right transtibial amputation recommended Band-Aid for protection follow-up if this worsens.  Follow-Up Instructions: Return if symptoms worsen or fail to improve.   Orders:  No orders of the defined types were placed in this encounter.  No orders of the defined types were placed in this encounter.     Procedures: No procedures performed   Clinical Data: No additional findings.   Subjective: Chief Complaint  Patient presents with  . Right Leg - Follow-up    Rick Lane is here to follow up on Right below the knee amputation.  He says he is doing pretty well. He does have a small area on the lateral side that is scabbing over.    Review of Systems   Objective: Vital Signs: There were no vitals taken for this visit.  Physical Exam on examination patient is alert oriented no adenopathy well-dressed normal affect normal respiratory effort he ambulates with a rolling walker. Examination left leg he has venous stasis brawny skin color changes with a very small pending ulcer laterally and open wound has cellulitis no signs of infection. Right transtibial amputation has consolidated nicely he does have a very small superficial ulcer over the lateral aspect of  his residual limb which is approximately 5 x 15 mm and 0.1 mm deep. This has good granulation tissue no signs of infection no drainage no depth no cellulitis.  Ortho Exam  Specialty Comments:  No specialty comments available.  Imaging: No results found.   PMFS History: Patient Active Problem List   Diagnosis Date Noted  . Non-pressure chronic ulcer of right calf, limited to breakdown of skin ([place]) 07/23/2016  . Below knee amputation status ([place]) 03/05/2016  . Healthcare maintenance 11/14/2015  . HLD (hyperlipidemia) 11/14/2015  . DM2 (diabetes mellitus, type 2) ([place]) 08/11/2015  . Depression 08/11/2015  . Vitamin D deficiency 08/11/2015  . Asthma 08/11/2015   Past Medical History:  Diagnosis Date  . Asthma   . Cataract   . Depression   . Diabetes mellitus without complication ([place])   . Hyperlipidemia     Family History  Problem Relation Age of Onset  . Heart disease Father   . Diabetes Sister   . Diabetes Brother     Past Surgical History:  Procedure Laterality Date  . AMPUTATION Right 12/12/2015   Procedure: Right Transmetatarsal Amputation With [facility] Placement;  Surgeon: [name], MD;  Location: [place];  Service: Orthopedics;  Laterality: Right;  . AMPUTATION Right 03/05/2016   Procedure: RIGHT BELOW KNEE AMPUTATION;  Surgeon: [name], MD;  Location: [place];  Service: Orthopedics;  Laterality: Right;  . EYE SURGERY     left eye  . toe removal Right 2012,2014   Social History   Occupational History  . Not on file.   Social History Main Topics  . Smoking status: Never Smoker  . Smokeless tobacco: Never Used  . Alcohol use No  . Drug use: No  . Sexual activity: Not on file

## 2016-09-18 ENCOUNTER — Other Ambulatory Visit: Payer: Self-pay | Admitting: Family Medicine

## 2016-10-08 ENCOUNTER — Other Ambulatory Visit: Payer: Self-pay | Admitting: Family Medicine

## 2016-10-15 ENCOUNTER — Ambulatory Visit (INDEPENDENT_AMBULATORY_CARE_PROVIDER_SITE_OTHER): Payer: Medicare Other | Admitting: Family Medicine

## 2016-10-15 ENCOUNTER — Encounter: Payer: Self-pay | Admitting: Family Medicine

## 2016-10-15 VITALS — BP 135/70 | HR 88 | Temp 97.5°F | Ht 74.0 in | Wt 288.2 lb

## 2016-10-15 DIAGNOSIS — E1142 Type 2 diabetes mellitus with diabetic polyneuropathy: Secondary | ICD-10-CM

## 2016-10-15 DIAGNOSIS — R0609 Other forms of dyspnea: Secondary | ICD-10-CM

## 2016-10-15 DIAGNOSIS — Z794 Long term (current) use of insulin: Secondary | ICD-10-CM | POA: Diagnosis not present

## 2016-10-15 DIAGNOSIS — R0600 Dyspnea, unspecified: Secondary | ICD-10-CM

## 2016-10-15 DIAGNOSIS — Z89511 Acquired absence of right leg below knee: Secondary | ICD-10-CM

## 2016-10-15 LAB — BAYER DCA HB A1C WAIVED: HB A1C (BAYER DCA - WAIVED): 7.6 % — ABNORMAL HIGH (ref <7.0)

## 2016-10-15 NOTE — Progress Notes (Signed)
   HPI  Patient presents today here for follow-up diabetes.  Diabetes Patient's average fasting blood sugar ranges 120-160 At times he's had [place] 50s and 60s, this is happening approximately 3 times in the last 3 months Hypoglycemia is accompanied by sweating and shaking. He recovers with sugar sweetened foods.  Dyspnea Patient and his wife are very worried about heart disease, his dad died of a heart complication after catheterization around his age. Patient does have shortness of breath with hardly any activity, however no chest pain. He has not been able to exercise on a regular basis in several years due to his difficulty with foot ulcer, he is now doing better after amputation.  Amputation Right foot amputated, then revised after nonhealing wound. He's doing well with an orthotic and walking with a cane now. His mobility is limited some now due to back pain as well as shortness of breath.  PMH: Smoking status noted ROS: Per HPI  Objective: BP 135/70   Pulse 88   Temp 97.5 F (36.4 C) (Oral)   Ht 6\' 2"  (1.88 m)   Wt 288 lb 3.2 oz (130.7 kg)   BMI 37.00 kg/m  Gen: NAD, alert, cooperative with exam HEENT: NCAT CV: RRR, good S1/S2, no murmur Resp: CTABL, no wheezes, non-labored Ext: No edema, warm Neuro: Alert and oriented, No gross deficits Skin:  R BKA with small approx 1 cm X 0.5 cm wound with granulation tissue at the base, no infection  Diabetic Foot Exam - Simple   Simple Foot Form Diabetic Foot exam was performed with the following findings:  Yes 10/15/2016  1:01 PM  Visual Inspection No deformities, no ulcerations, no other skin breakdown bilaterally:  Yes Sensation Testing See comments:  Yes Pulse Check Posterior Tibialis and Dorsalis pulse intact bilaterally:  Yes Comments R BKA Sensation absent to upper 2/3 of LLE.      Assessment and plan:  # T2DM Control slipping somewhat Consider adding GLP Reduce Lantus dose by 4 units daily, 38 BID  now Continue glipizide and mealtime insulin*  # S/P BKA Doing well with orthotic, now with small area of slow heaing on the wound.   # Exertional dyspnea NO CP, He is definitely deconditioned and has asthma, although his symptoms do not reflect asthma very clearly. Asthma has not been an issue during our time together, no albuterol.  Refer to cardiology for their opinion but I think likely warrant stress test with DM2     Orders Placed This Encounter  Procedures  . Microalbumin / creatinine urine ratio  . Bayer DCA Hb A1c Waived    Meds ordered this encounter  Medications  . sertraline (ZOLOFT) 20 MG/ML concentrated solution    Sig: Take 20 mg by mouth daily.    Laroy Apple, MD [facility] 10/15/2016, 12:48 PM

## 2016-10-15 NOTE — Patient Instructions (Addendum)
Great to see you!   Reduce your Lantus to 38 units twice daily.   You will be called for a cardiology appointment

## 2016-10-16 LAB — MICROALBUMIN / CREATININE URINE RATIO
CREATININE, UR: 106.5 mg/dL
MICROALB/CREAT RATIO: 8.7 mg/g{creat} (ref 0.0–30.0)
MICROALBUM., U, RANDOM: 9.3 ug/mL

## 2016-11-02 ENCOUNTER — Ambulatory Visit (INDEPENDENT_AMBULATORY_CARE_PROVIDER_SITE_OTHER): Payer: Medicare Other | Admitting: Orthopedic Surgery

## 2016-11-02 ENCOUNTER — Encounter (INDEPENDENT_AMBULATORY_CARE_PROVIDER_SITE_OTHER): Payer: Self-pay | Admitting: Orthopedic Surgery

## 2016-11-02 VITALS — Ht 74.0 in | Wt 288.0 lb

## 2016-11-02 DIAGNOSIS — Z89511 Acquired absence of right leg below knee: Secondary | ICD-10-CM | POA: Diagnosis not present

## 2016-11-02 DIAGNOSIS — M86461 Chronic osteomyelitis with draining sinus, right tibia and fibula: Secondary | ICD-10-CM | POA: Diagnosis not present

## 2016-11-02 DIAGNOSIS — L97211 Non-pressure chronic ulcer of right calf limited to breakdown of skin: Secondary | ICD-10-CM | POA: Diagnosis not present

## 2016-11-02 HISTORY — DX: Chronic osteomyelitis with draining sinus, right tibia and fibula: M86.461

## 2016-11-02 NOTE — Progress Notes (Signed)
Office Visit Note   Patient: Rick Lane           Date of Birth: [birth year]           MRN: [id number] Visit Date: 11/02/2016              Requested by: Rick Euler, Rick Lane Rick Lane, Rick Lane [id number] PCP: Rick File, Rick Lane  Chief Complaint  Patient presents with  . Right Leg - Pain    R BKA 03/05/16 8 months post op  . Left Foot - Open Wound      HPI: Patient is a 66 year old gentleman who is about 8 months status post a right transtibial amputation. Patient states he noticed a draining sinus ulcer for about a week over the fibula right transtibial amputation. Patient states he's been having pain in this area he has taken his prosthesis off about 3 weeks ago. He states his blood sugars are doing well with the morning sugar about 78 fasting blood sugar 138. Patient states that he also has a several ulcers on the lateral aspect of the left leg.  Assessment & Plan: Visit Diagnoses:  1. Non-pressure chronic ulcer of right calf, limited to breakdown of skin (Rick Lane)   2. S/P unilateral BKA (below knee amputation), right (Rick Lane)   3. 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 Patient will also need to wear compression stockings for the venous stasis ulcers left lower extremity. Plan for surgery as an outpatient.  Follow-Up Instructions: Return in about 2 weeks (around 11/16/2016).   Ortho Exam  Patient is alert, oriented, no adenopathy, well-dressed, normal affect, normal respiratory effort. Examination patient is ambulating in a wheelchair. Examination patient has an ulcer approximately 2 cm in diameter over the distal fibula. This has a sinus tract which probes down to the distal fibula. There is no ascending cellulitis no purulent drainage no  odor. Examination the left lower extremity patient has venous stasis changes in the entire left lower extremity he has multiple healed ulcers but he has 2 small ulcers over the dorsal lateral aspect left foot and lateral heel. These areas are nontender to palpation there is no drainage no cellulitis. He has brawny skin color changes with pitting edema up to the tibial tubercle.  Imaging: No results found.  Labs: Lab Results  Component Value Date   HGBA1C 7.9 (H) 03/05/2016   HGBA1C 8.0 08/11/2015    Orders:  No orders of the defined types were placed in this encounter.  No orders of the defined types were placed in this encounter.    Procedures: No procedures performed  Clinical Data: No additional findings.  ROS:  All other systems negative, except as noted in the HPI. Review of Systems  Objective: Vital Signs: Ht 6\' 2"  (1.88 m)   Wt 288 lb (130.6 kg)   BMI 36.98 kg/m   Specialty Comments:  No specialty comments available.  PMFS History: Patient Active Problem List   Diagnosis Date Noted  . 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 Non-pressure chronic ulcer of right calf, limited to breakdown of skin (Rick Lane) 07/23/2016  . Below knee amputation status (Rick Lane) 03/05/2016  . S/P unilateral BKA (below knee amputation), right (Rick Lane) 12/12/2015  . Healthcare  maintenance 11/14/2015  . HLD (hyperlipidemia) 11/14/2015  . DM2 (diabetes mellitus, type 2) (Rick Lane) 08/11/2015  . Depression 08/11/2015  . Vitamin D deficiency 08/11/2015  . Asthma 08/11/2015   Past Medical History:  Diagnosis Date  . Asthma   . Cataract   . Depression   . Diabetes mellitus without complication ([place])   . Hyperlipidemia     Family History  Problem Relation Age of Onset  . Heart disease Father   . Diabetes Sister   . Diabetes Brother     Past Surgical History:  Procedure Laterality Date  . AMPUTATION Right 12/12/2015   Procedure: Right Transmetatarsal  Amputation With [facility] Placement;  Surgeon: Rick Minion, Rick Lane;  Location: [place];  Service: Orthopedics;  Laterality: Right;  . AMPUTATION Right 03/05/2016   Procedure: RIGHT BELOW KNEE AMPUTATION;  Surgeon: Rick Minion, Rick Lane;  Location: [place];  Service: Orthopedics;  Laterality: Right;  . EYE SURGERY     left eye  . toe removal Right 2012,2014   Social History   Occupational History  . Not on Lane.   Social History Main Topics  . Smoking status: Never Smoker  . Smokeless tobacco: Never Used  . Alcohol use No  . Drug use: No  . Sexual activity: Not on Lane

## 2016-11-08 ENCOUNTER — Other Ambulatory Visit (INDEPENDENT_AMBULATORY_CARE_PROVIDER_SITE_OTHER): Payer: Self-pay | Admitting: Family

## 2016-11-09 ENCOUNTER — Encounter (HOSPITAL_COMMUNITY): Payer: Self-pay | Admitting: *Deleted

## 2016-11-09 MED ORDER — DEXTROSE 5 % IV SOLN
3.0000 g | INTRAVENOUS | Status: AC
  Administered 2016-11-10: 3 g via INTRAVENOUS
  Filled 2016-11-09: qty 3000

## 2016-11-09 NOTE — Progress Notes (Signed)
   11/09/16 1136  OBSTRUCTIVE SLEEP APNEA  Have you ever been diagnosed with sleep apnea through a sleep study? Yes  If yes, do you have and use a CPAP or BPAP machine every night? 0  Do you snore loudly (loud enough to be heard through closed doors)?  0  Do you often feel tired, fatigued, or sleepy during the daytime (such as falling asleep during driving or talking to someone)? 0  Has anyone observed you stop breathing during your sleep? 1  Do you have, or are you being treated for high blood pressure? 0  BMI more than 35 kg/m2? 1  Age > 50 (1-yes) 1  Neck circumference greater than:Male 16 inches or larger, Male 17inches or larger? 1 (49)  Male Gender (Yes=1) 1  Obstructive Sleep Apnea Score 5  Score 5 or greater  Results sent to PCP

## 2016-11-09 NOTE — Progress Notes (Signed)
Rick Lane denies chest pain, states he has some shortness of breath due to lack of activity.   I instructed patient to eat a snack that has protein  at bedtime.  I instructed patient to check CBG in am when he gets up and every 2  hours until he arrives to the hospital. If CBG < 70 do not take Lantus, drink 1/2 cup o clear juice, recheck CBG in 15 minutes and call pre- op desk. If CBG > 220 take 1/2 of Novolog dose. Take 1/2 of Lantus dose tonight and if CBG > 70 in am. Patient repeated instructions to me.

## 2016-11-10 ENCOUNTER — Ambulatory Visit (HOSPITAL_COMMUNITY): Payer: Medicare Other | Admitting: Certified Registered Nurse Anesthetist

## 2016-11-10 ENCOUNTER — Encounter (HOSPITAL_COMMUNITY)
Admission: RE | Disposition: A | Discharge status: Home / Self Care | Payer: Self-pay | Source: Ambulatory Visit | Attending: Orthopedic Surgery

## 2016-11-10 ENCOUNTER — Encounter (HOSPITAL_COMMUNITY): Payer: Self-pay | Admitting: *Deleted

## 2016-11-10 ENCOUNTER — Ambulatory Visit (HOSPITAL_COMMUNITY)
Admission: RE | Admit: 2016-11-10 | Discharge: 2016-11-10 | Disposition: A | Discharge status: Home / Self Care | Payer: Medicare Other | Source: Ambulatory Visit | Attending: Orthopedic Surgery | Admitting: Orthopedic Surgery

## 2016-11-10 DIAGNOSIS — E785 Hyperlipidemia, unspecified: Secondary | ICD-10-CM | POA: Insufficient documentation

## 2016-11-10 DIAGNOSIS — E1169 Type 2 diabetes mellitus with other specified complication: Secondary | ICD-10-CM | POA: Insufficient documentation

## 2016-11-10 DIAGNOSIS — M869 Osteomyelitis, unspecified: Secondary | ICD-10-CM | POA: Diagnosis not present

## 2016-11-10 DIAGNOSIS — E1140 Type 2 diabetes mellitus with diabetic neuropathy, unspecified: Secondary | ICD-10-CM | POA: Insufficient documentation

## 2016-11-10 DIAGNOSIS — F329 Major depressive disorder, single episode, unspecified: Secondary | ICD-10-CM | POA: Diagnosis not present

## 2016-11-10 DIAGNOSIS — Z794 Long term (current) use of insulin: Secondary | ICD-10-CM | POA: Diagnosis not present

## 2016-11-10 DIAGNOSIS — T8781 Dehiscence of amputation stump: Secondary | ICD-10-CM | POA: Diagnosis not present

## 2016-11-10 DIAGNOSIS — Y835 Amputation of limb(s) as the cause of abnormal reaction of the patient, or of later complication, without mention of misadventure at the time of the procedure: Secondary | ICD-10-CM | POA: Diagnosis not present

## 2016-11-10 DIAGNOSIS — Z79899 Other long term (current) drug therapy: Secondary | ICD-10-CM | POA: Insufficient documentation

## 2016-11-10 DIAGNOSIS — M86461 Chronic osteomyelitis with draining sinus, right tibia and fibula: Secondary | ICD-10-CM | POA: Diagnosis not present

## 2016-11-10 HISTORY — DX: Polyneuropathy, unspecified: G62.9

## 2016-11-10 HISTORY — PX: STUMP REVISION: SHX6102

## 2016-11-10 LAB — CBC
HCT: 41.3 % (ref 39.0–52.0)
HEMOGLOBIN: 13.6 g/dL (ref 13.0–17.0)
MCH: 29.4 pg (ref 26.0–34.0)
MCHC: 32.9 g/dL (ref 30.0–36.0)
MCV: 89.2 fL (ref 78.0–100.0)
PLATELETS: 192 10*3/uL (ref 150–400)
RBC: 4.63 MIL/uL (ref 4.22–5.81)
RDW: 13.7 % (ref 11.5–15.5)
WBC: 6.3 10*3/uL (ref 4.0–10.5)

## 2016-11-10 LAB — BASIC METABOLIC PANEL
Anion gap: 12 (ref 5–15)
BUN: 21 mg/dL — ABNORMAL HIGH (ref 6–20)
CO2: 22 mmol/L (ref 22–32)
CREATININE: 1.17 mg/dL (ref 0.61–1.24)
Calcium: 9.3 mg/dL (ref 8.9–10.3)
Chloride: 106 mmol/L (ref 101–111)
GFR calc Af Amer: >60 mL/min (ref >60)
GFR calc non Af Amer: >60 mL/min (ref >60)
GLUCOSE: 113 mg/dL — AB (ref 65–99)
Potassium: 4.5 mmol/L (ref 3.5–5.1)
Sodium: 140 mmol/L (ref 135–145)

## 2016-11-10 LAB — GLUCOSE, CAPILLARY
GLUCOSE-CAPILLARY: 102 mg/dL — AB (ref 65–99)
Glucose-Capillary: 117 mg/dL — ABNORMAL HIGH (ref 65–99)
Glucose-Capillary: 85 mg/dL (ref 65–99)

## 2016-11-10 SURGERY — REVISION, AMPUTATION SITE
Anesthesia: General | Site: Leg Lower | Laterality: Right

## 2016-11-10 MED ORDER — EPHEDRINE 5 MG/ML INJ
INTRAVENOUS | Status: AC
  Filled 2016-11-10: qty 20

## 2016-11-10 MED ORDER — LIDOCAINE 2% (20 MG/ML) 5 ML SYRINGE
INTRAMUSCULAR | Status: AC
  Filled 2016-11-10: qty 5

## 2016-11-10 MED ORDER — 0.9 % SODIUM CHLORIDE (POUR BTL) OPTIME
TOPICAL | Status: DC | PRN
Start: 2016-11-10 — End: 2016-11-10
  Administered 2016-11-10: 1000 mL

## 2016-11-10 MED ORDER — FENTANYL CITRATE (PF) 250 MCG/5ML IJ SOLN
INTRAMUSCULAR | Status: AC
  Filled 2016-11-10: qty 5

## 2016-11-10 MED ORDER — CHLORHEXIDINE GLUCONATE 4 % EX LIQD: 60.0000 mL | Freq: Once | CUTANEOUS | Status: DC

## 2016-11-10 MED ORDER — HYDROMORPHONE HCL 1 MG/ML IJ SOLN: 0.2500 mg | INTRAMUSCULAR | Status: DC | PRN

## 2016-11-10 MED ORDER — OXYCODONE HCL 5 MG/5ML PO SOLN: 5.0000 mg | Freq: Once | ORAL | Status: DC | PRN

## 2016-11-10 MED ORDER — ONDANSETRON HCL 4 MG/2ML IJ SOLN
INTRAMUSCULAR | Status: AC
  Filled 2016-11-10: qty 2

## 2016-11-10 MED ORDER — LIDOCAINE HCL (CARDIAC) 20 MG/ML IV SOLN
INTRAVENOUS | Status: DC | PRN
  Administered 2016-11-10: 100 mg via INTRAVENOUS

## 2016-11-10 MED ORDER — PROPOFOL 10 MG/ML IV BOLUS
INTRAVENOUS | Status: AC
  Filled 2016-11-10: qty 20

## 2016-11-10 MED ORDER — PHENYLEPHRINE 40 MCG/ML (10ML) SYRINGE FOR IV PUSH (FOR BLOOD PRESSURE SUPPORT)
PREFILLED_SYRINGE | INTRAVENOUS | Status: AC
  Filled 2016-11-10: qty 20

## 2016-11-10 MED ORDER — MIDAZOLAM HCL 2 MG/2ML IJ SOLN
INTRAMUSCULAR | Status: AC
  Filled 2016-11-10: qty 2

## 2016-11-10 MED ORDER — FENTANYL CITRATE (PF) 100 MCG/2ML IJ SOLN
INTRAMUSCULAR | Status: DC | PRN
  Administered 2016-11-10: 25 ug via INTRAVENOUS
  Administered 2016-11-10: 100 ug via INTRAVENOUS

## 2016-11-10 MED ORDER — OXYCODONE-ACETAMINOPHEN 5-325 MG PO TABS: 1.0000 | ORAL_TABLET | ORAL | 0 refills | Status: DC | PRN

## 2016-11-10 MED ORDER — EPHEDRINE SULFATE 50 MG/ML IJ SOLN
INTRAMUSCULAR | Status: DC | PRN
  Administered 2016-11-10 (×3): 15 mg via INTRAVENOUS

## 2016-11-10 MED ORDER — OXYCODONE HCL 5 MG PO TABS: 5.0000 mg | ORAL_TABLET | Freq: Once | ORAL | Status: DC | PRN

## 2016-11-10 MED ORDER — MIDAZOLAM HCL 5 MG/5ML IJ SOLN
INTRAMUSCULAR | Status: DC | PRN
  Administered 2016-11-10: 2 mg via INTRAVENOUS

## 2016-11-10 MED ORDER — PROPOFOL 10 MG/ML IV BOLUS
INTRAVENOUS | Status: DC | PRN
  Administered 2016-11-10: 160 mg via INTRAVENOUS

## 2016-11-10 MED ORDER — SUCCINYLCHOLINE CHLORIDE 200 MG/10ML IV SOSY
PREFILLED_SYRINGE | INTRAVENOUS | Status: AC
  Filled 2016-11-10: qty 20

## 2016-11-10 MED ORDER — PHENYLEPHRINE 40 MCG/ML (10ML) SYRINGE FOR IV PUSH (FOR BLOOD PRESSURE SUPPORT)
PREFILLED_SYRINGE | INTRAVENOUS | Status: AC
  Filled 2016-11-10: qty 10

## 2016-11-10 MED ORDER — LACTATED RINGERS IV SOLN
INTRAVENOUS | Status: DC
  Administered 2016-11-10: 13:00:00 via INTRAVENOUS

## 2016-11-10 SURGICAL SUPPLY — 32 items
BENZOIN TINCTURE PRP APPL 2/3 (GAUZE/BANDAGES/DRESSINGS) ×3 IMPLANT
BLADE SAW RECIP 87.9 MT (BLADE) ×3 IMPLANT
BLADE SURG 21 STRL SS (BLADE) ×3 IMPLANT
COVER SURGICAL LIGHT HANDLE (MISCELLANEOUS) ×3 IMPLANT
DRAPE EXTREMITY T 121X128X90 (DRAPE) ×3 IMPLANT
DRAPE HALF SHEET 40X57 (DRAPES) ×3 IMPLANT
DRAPE INCISE IOBAN 66X45 STRL (DRAPES) ×6 IMPLANT
DRAPE U-SHAPE 47X51 STRL (DRAPES) ×6 IMPLANT
DRSG VAC ATS MED SENSATRAC (GAUZE/BANDAGES/DRESSINGS) ×3 IMPLANT
DURAPREP 26ML APPLICATOR (WOUND CARE) ×3 IMPLANT
ELECT CAUTERY BLADE 6.4 (BLADE) ×3 IMPLANT
ELECT REM PT RETURN 9FT ADLT (ELECTROSURGICAL) ×3
ELECTRODE REM PT RTRN 9FT ADLT (ELECTROSURGICAL) ×1 IMPLANT
GLOVE BIOGEL PI IND STRL 9 (GLOVE) ×1 IMPLANT
GLOVE BIOGEL PI INDICATOR 9 (GLOVE) ×2
GLOVE SURG ORTHO 9.0 STRL STRW (GLOVE) ×3 IMPLANT
GOWN STRL REUS W/ TWL XL LVL3 (GOWN DISPOSABLE) ×2 IMPLANT
GOWN STRL REUS W/TWL XL LVL3 (GOWN DISPOSABLE) ×4
KIT BASIN OR (CUSTOM PROCEDURE TRAY) ×3 IMPLANT
KIT ROOM TURNOVER OR (KITS) ×3 IMPLANT
MANIFOLD NEPTUNE II (INSTRUMENTS) ×3 IMPLANT
NS IRRIG 1000ML POUR BTL (IV SOLUTION) ×3 IMPLANT
PACK GENERAL/GYN (CUSTOM PROCEDURE TRAY) ×3 IMPLANT
PAD ARMBOARD 7.5X6 YLW CONV (MISCELLANEOUS) ×3 IMPLANT
PAD NEG PRESSURE SENSATRAC (MISCELLANEOUS) IMPLANT
PREVENA INCISION MGT 90 150 (MISCELLANEOUS) ×3 IMPLANT
STAPLER VISISTAT 35W (STAPLE) IMPLANT
SUT ETHILON 2 0 PSLX (SUTURE) ×6 IMPLANT
SUT SILK 2 0 (SUTURE) ×2
SUT SILK 2-0 18XBRD TIE 12 (SUTURE) ×1 IMPLANT
TOWEL OR 17X26 10 PK STRL BLUE (TOWEL DISPOSABLE) ×3 IMPLANT
WND VAC CANISTER 500ML (MISCELLANEOUS) ×3 IMPLANT

## 2016-11-10 NOTE — Transfer of Care (Signed)
Immediate Anesthesia Transfer of Care Note  Patient: [name]  Procedure(s) Performed: Procedure(s): REVISION RIGHT BELOW KNEE AMPUTATION (Right)  Patient Location: PACU  Anesthesia Type:General  Level of Consciousness: awake, alert , oriented and patient cooperative  Airway & Oxygen Therapy: Patient Spontanous Breathing  Post-op Assessment: Report given to RN and Post -op Vital signs reviewed and stable  Post vital signs: Reviewed and stable  Last Vitals:  Vitals:   11/10/16 1235 11/10/16 1643  BP: 135/65 (!) 122/57  Pulse: 75 94  Resp: 18 11  Temp: 36.7 C 36.5 C    Last Pain:  Vitals:   11/10/16 1235  TempSrc: Oral      Patients Stated Pain Goal: 4 (65/99/35 7017)  Complications: No apparent anesthesia complications

## 2016-11-10 NOTE — Op Note (Signed)
11/10/2016  4:28 PM  PATIENT:  Rick Lane    PRE-OPERATIVE DIAGNOSIS:  Osteomyelitis Right Fibula  POST-OPERATIVE DIAGNOSIS:  Same  PROCEDURE:  REVISION RIGHT BELOW KNEE AMPUTATION  Application of incisional Prevena wound VAC  SURGEON:  [name], MD  PHYSICIAN ASSISTANT:None ANESTHESIA:   General  PREOPERATIVE INDICATIONS:  Rick Lane is a  66 y.o. male with a diagnosis of Osteomyelitis Right Fibula who failed conservative measures and elected for surgical management.    The risks benefits and alternatives were discussed with the patient preoperatively including but not limited to the risks of infection, bleeding, nerve injury, cardiopulmonary complications, the need for revision surgery, among others, and the patient was willing to proceed.  OPERATIVE IMPLANTS: Prevena wound VAC  OPERATIVE FINDINGS: Exposed distal fibula with open wound  OPERATIVE PROCEDURE: Patient brought to the operating room and underwent a general anesthetic. After adequate levels anesthesia were obtained patient's right lower extremity was prepped using DuraPrep draped into a sterile field a timeout was called. A elliptical incision was made around the ulcerative tissue and the distal fibula. The ulcer surrounding tissue and distal 2 cm the fibula were resected in 1 block of tissue. The wound was irrigated with normal saline there is no deep abscess or ischemic tissue there is good healthy bleeding granulation tissue electrocautery was used for hemostasis. After irrigation local tissue rearrangement was performed to close a wound 7 x 3 cm, using 2-0 nylon. A Prevena incisional wound VAC was applied this had a good suction fit. Patient was extubated taken the PACU in stable condition plan for discharge to home.

## 2016-11-10 NOTE — Anesthesia Procedure Notes (Signed)
Procedure Name: LMA Insertion Date/Time: 11/10/2016 4:04 PM Performed by: Salli Quarry Mavrick Mcquigg Pre-anesthesia Checklist: Patient identified, Emergency Drugs available, Suction available and Patient being monitored Patient Re-evaluated:Patient Re-evaluated prior to inductionOxygen Delivery Method: Circle System Utilized Preoxygenation: Pre-oxygenation with 100% oxygen Intubation Type: IV induction LMA: LMA inserted LMA Size: 5.0 Number of attempts: 1 Placement Confirmation: positive ETCO2 Tube secured with: Tape Dental Injury: Teeth and Oropharynx as per pre-operative assessment

## 2016-11-10 NOTE — Anesthesia Preprocedure Evaluation (Signed)
Anesthesia Evaluation  Patient identified by MRN, date of birth, ID band Patient awake    Reviewed: Allergy & Precautions, NPO status , Patient's Chart, lab work & pertinent test results  History of Anesthesia Complications Negative for: history of anesthetic complications  Airway Mallampati: III  TM Distance: >3 FB Neck ROM: Full    Dental  (+) Dental Advisory Given, Missing   Pulmonary asthma ,    Pulmonary exam normal breath sounds clear to auscultation       Cardiovascular (-) angina(-) Past MI, (-) Cardiac Stents, (-) CABG and (-) Orthopnea negative cardio ROS   Rhythm:Regular Rate:Normal     Neuro/Psych neg Seizures PSYCHIATRIC DISORDERS Depression negative neurological ROS     GI/Hepatic negative GI ROS, Neg liver ROS,   Endo/Other  diabetes, Poorly Controlled, Type 2, Insulin Dependent, Oral Hypoglycemic Agents  Renal/GU negative Renal ROS     Musculoskeletal Osteomyelitis RLE   Abdominal (+) + obese,   Peds  Hematology negative hematology ROS (+)   Anesthesia Other Findings HLD  Reproductive/Obstetrics                             Anesthesia Physical Anesthesia Plan  ASA: III  Anesthesia Plan: General   Post-op Pain Management:    Induction: Intravenous  Airway Management Planned: LMA  Additional Equipment: None  Intra-op Plan:   Post-operative Plan: Extubation in OR  Informed Consent: I have reviewed the patients History and Physical, chart, labs and discussed the procedure including the risks, benefits and alternatives for the proposed anesthesia with the patient or authorized representative who has indicated his/her understanding and acceptance.   Dental advisory given  Plan Discussed with: CRNA and Surgeon  Anesthesia Plan Comments:         Anesthesia Quick Evaluation

## 2016-11-10 NOTE — H&P (Signed)
Rick Lane is an 66 y.o. male.   Chief Complaint: Dehiscence right transtibial amputation. HPI: Patient is a 66 year old gentleman with diabetic insensate neuropathy who is status post right transtibial amputation. Patient has had progressive dehiscence of the wound and presents at this time for revision transtibial amputation.  Past Medical History:  Diagnosis Date  . Asthma    ass a child  . Cataract   . Depression   . Diabetes mellitus without complication ([place])    Type II  . Hyperlipidemia   . Neuropathy     Past Surgical History:  Procedure Laterality Date  . AMPUTATION Right 12/12/2015   Procedure: Right Transmetatarsal Amputation With [facility] Placement;  Surgeon: [name], MD;  Location: [place];  Service: Orthopedics;  Laterality: Right;  . AMPUTATION Right 03/05/2016   Procedure: RIGHT BELOW KNEE AMPUTATION;  Surgeon: [name], MD;  Location: [place];  Service: Orthopedics;  Laterality: Right;  . COLONOSCOPY    . EYE SURGERY Left    Cataract  . EYE SURGERY Right    r  . toe removal Right 2012,2014    Family History  Problem Relation Age of Onset  . Heart disease Father   . Diabetes Sister   . Diabetes Brother    Social History:  reports that he has never smoked. He has never used smokeless tobacco. He reports that he does not drink alcohol or use drugs.  Allergies:  Allergies  Allergen Reactions  . Bactrim [Sulfamethoxazole-Trimethoprim] Itching  . Gentamicin     No prescriptions prior to admission.    No results found for this or any previous visit (from the past 48 hour(s)). No results found.  Review of Systems  All other systems reviewed and are negative.   There were no vitals taken for this visit. Physical Exam  Examination patient is alert oriented no adenopathy well-dressed normal affect normal wrist where for patient ambulates in a wheelchair. Examination patient has dehiscence of the transtibial  amputation. Assessment/Plan Assessment: Diabetic insensate neuropathy with dehiscence right transtibial amputation.  Plan: We'll plan for revision of the transtibial amputation. Risk and benefits were discussed including nonhealing of the wound need for additional surgery need for higher level amputation. Patient states he understands wish to proceed at this time.  [name], MD 11/10/2016, 8:00 AM

## 2016-11-11 ENCOUNTER — Encounter (HOSPITAL_COMMUNITY): Payer: Self-pay | Admitting: Orthopedic Surgery

## 2016-11-11 ENCOUNTER — Telehealth (INDEPENDENT_AMBULATORY_CARE_PROVIDER_SITE_OTHER): Payer: Self-pay

## 2016-11-11 LAB — HEMOGLOBIN A1C
Hgb A1c MFr Bld: 7.6 % — ABNORMAL HIGH (ref 4.8–5.6)
MEAN PLASMA GLUCOSE: 171 mg/dL

## 2016-11-11 NOTE — Anesthesia Postprocedure Evaluation (Addendum)
Anesthesia Post Note  Patient: Rick Lane  Procedure(s) Performed: Procedure(s) (LRB): REVISION RIGHT BELOW KNEE AMPUTATION (Right)  Patient location during evaluation: PACU Anesthesia Type: General Level of consciousness: awake and alert Pain management: pain level controlled Vital Signs Assessment: post-procedure vital signs reviewed and stable Respiratory status: spontaneous breathing, nonlabored ventilation, respiratory function stable and patient connected to nasal cannula oxygen Cardiovascular status: blood pressure returned to baseline and stable Postop Assessment: no signs of nausea or vomiting Anesthetic complications: no       Last Vitals:  Vitals:   11/10/16 1740 11/10/16 1744  BP:  125/74  Pulse: 85 87  Resp: 16 14  Temp: 36.6 C     Last Pain:  Vitals:   11/10/16 1235  TempSrc: Oral                 Authur Cubit

## 2016-11-11 NOTE — Telephone Encounter (Signed)
Scheduled appt. For Monday at 1:45pm.  Called and left voicemail advising patient of time for Monday appointment with Dr. Sharol Given

## 2016-11-11 NOTE — Telephone Encounter (Signed)
Patient stated that he had surgery on 11/10/2016 and was told by Dr. Sharol Given to schedule an appt. For Monday 11/15/16.  I didn't see any available appointments for that day except for same day.  Can you please call and advise. Thank you. CB # is [phone]

## 2016-11-11 NOTE — Telephone Encounter (Signed)
There is one appt on Monday at 1:45 please call pt to sch

## 2016-11-15 ENCOUNTER — Ambulatory Visit (INDEPENDENT_AMBULATORY_CARE_PROVIDER_SITE_OTHER): Payer: Medicare Other | Admitting: Orthopedic Surgery

## 2016-11-15 ENCOUNTER — Encounter (INDEPENDENT_AMBULATORY_CARE_PROVIDER_SITE_OTHER): Payer: Self-pay | Admitting: Orthopedic Surgery

## 2016-11-15 VITALS — Ht 75.0 in | Wt 280.0 lb

## 2016-11-15 DIAGNOSIS — Z89511 Acquired absence of right leg below knee: Secondary | ICD-10-CM

## 2016-11-15 DIAGNOSIS — L97211 Non-pressure chronic ulcer of right calf limited to breakdown of skin: Secondary | ICD-10-CM

## 2016-11-15 NOTE — Progress Notes (Signed)
   Post-Op Visit Note   Patient: Rick Lane           Date of Birth: 26-Jan-1951           MRN: [id number] Visit Date: 11/15/2016 PCP: Kenn File, MD  Chief Complaint:  Chief Complaint  Patient presents with  . Right Leg - Routine Post Op    11/10/16 right below the knee amputation revision with prevena wound vac    HPI:  The patient is a 66 year old gentleman who presents 1 week status post right below the knee amputation revision. prevena VAC was removed today.    Ortho Exam  right below the knee amputation. Sutures remain in place laterally there is bloody drainage there is no erythema and no odor no purulence no cellulitis.  Visit Diagnoses:  1. Non-pressure chronic ulcer of right calf, limited to breakdown of skin ([place])   2. S/P unilateral BKA (below knee amputation), right ([place])     Plan: daily Dial soap cleansing. Apply dry dressing. Apply shrinker over dressing. Follow up in office in 2 weeks for suture removal.  Follow-Up Instructions: Return in about 2 weeks (around 11/29/2016).   Imaging: No results found.  Orders:  No orders of the defined types were placed in this encounter.  No orders of the defined types were placed in this encounter.    PMFS History: Patient Active Problem List   Diagnosis Date Noted  . 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 Non-pressure chronic ulcer of right calf, limited to breakdown of skin ([place]) 07/23/2016  . Below knee amputation status ([place]) 03/05/2016  . S/P unilateral BKA (below knee amputation), right ([place]) 12/12/2015  . Healthcare maintenance 11/14/2015  . HLD (hyperlipidemia) 11/14/2015  . DM2 (diabetes mellitus, type 2) ([place]) 08/11/2015  . Depression 08/11/2015  . Vitamin D deficiency 08/11/2015  . Asthma 08/11/2015   Past Medical History:  Diagnosis Date  . Asthma    ass a child  . Cataract   . Depression   . Diabetes mellitus without complication ([place])    Type II  .  Hyperlipidemia   . Neuropathy     Family History  Problem Relation Age of Onset  . Heart disease Father   . Diabetes Sister   . Diabetes Brother     Past Surgical History:  Procedure Laterality Date  . AMPUTATION Right 12/12/2015   Procedure: Right Transmetatarsal Amputation With [facility] Placement;  Surgeon: [name], MD;  Location: [place];  Service: Orthopedics;  Laterality: Right;  . AMPUTATION Right 03/05/2016   Procedure: RIGHT BELOW KNEE AMPUTATION;  Surgeon: [name], MD;  Location: [place];  Service: Orthopedics;  Laterality: Right;  . COLONOSCOPY    . EYE SURGERY Left    Cataract  . EYE SURGERY Right    r  . STUMP REVISION Right 11/10/2016   Procedure: REVISION RIGHT BELOW KNEE AMPUTATION;  Surgeon: [name], MD;  Location: [place];  Service: Orthopedics;  Laterality: Right;  . toe removal Right 2012,2014   Social History   Occupational History  . Not on file.   Social History Main Topics  . Smoking status: Never Smoker  . Smokeless tobacco: Never Used  . Alcohol use No  . Drug use: No  . Sexual activity: Not on file

## 2016-11-16 ENCOUNTER — Ambulatory Visit: Payer: Medicare Other | Admitting: Cardiology

## 2016-11-25 ENCOUNTER — Ambulatory Visit (INDEPENDENT_AMBULATORY_CARE_PROVIDER_SITE_OTHER): Payer: Medicare Other | Admitting: Cardiology

## 2016-11-25 ENCOUNTER — Encounter: Payer: Self-pay | Admitting: Cardiology

## 2016-11-25 VITALS — BP 126/70 | HR 87 | Ht 75.0 in | Wt 282.2 lb

## 2016-11-25 DIAGNOSIS — R0609 Other forms of dyspnea: Secondary | ICD-10-CM

## 2016-11-25 DIAGNOSIS — R0600 Dyspnea, unspecified: Secondary | ICD-10-CM

## 2016-11-25 DIAGNOSIS — R0789 Other chest pain: Secondary | ICD-10-CM | POA: Diagnosis not present

## 2016-11-25 DIAGNOSIS — I739 Peripheral vascular disease, unspecified: Secondary | ICD-10-CM

## 2016-11-25 DIAGNOSIS — E7849 Other hyperlipidemia: Secondary | ICD-10-CM

## 2016-11-25 DIAGNOSIS — Z8249 Family history of ischemic heart disease and other diseases of the circulatory system: Secondary | ICD-10-CM | POA: Diagnosis not present

## 2016-11-25 DIAGNOSIS — E1152 Type 2 diabetes mellitus with diabetic peripheral angiopathy with gangrene: Secondary | ICD-10-CM | POA: Diagnosis not present

## 2016-11-25 DIAGNOSIS — Z89511 Acquired absence of right leg below knee: Secondary | ICD-10-CM

## 2016-11-25 DIAGNOSIS — Z794 Long term (current) use of insulin: Secondary | ICD-10-CM | POA: Diagnosis not present

## 2016-11-25 DIAGNOSIS — E784 Other hyperlipidemia: Secondary | ICD-10-CM | POA: Diagnosis not present

## 2016-11-25 NOTE — Progress Notes (Signed)
PCP: [name], MD  - [facility] Note: Chief Complaint  Patient presents with  . New Patient (Initial Visit)    Pt. states no complaints (however on closer examination he does indicate exertional dyspnea)    HPI: Rick Lane is a 66 y.o. male who is being seen today for the evaluation of Exertional dyspnea (in the setting of significant cardiovascular risk factors) at the request of [name], MD. His father died of heart complication after cardiac catheterization around this age. He has noted significant exertional dyspnea with any activity level. However now Chest pain. Has been unable to exercise regular basis for use due to his diabetic foot problems. Now this is improving post.  He is status post right BKA for chronic RLE osteomyelitis. He then recently was readmitted for dehiscence of the amputation stump. He basically had diabetic neuropathy with progressive diabetic foot ulcers.  Oaklee Sunga was seen by his PCP on 10/15/2016. He is now referred following this visit where the patient describes some exertional dyspnea.  Recent Hospitalizations: for stump revision in March.  Studies Personally Reviewed - if available, images/films reviewed: From Epic Chart or Care Everywhere:  Myoview stress test January 2013: No ischemia or infarction with EF of 60%.(ORDERED for syncope)  Interval History: Merrell presents today for evaluation of exertional dyspnea given his family history of heart disease.  Desmin indicates that he is a very sedentary person, has not really done much in the way of rehabilitation since his last surgery. He denies any exertional chest pain, but he does have shortness of breath with activity. His PCPs note says that is with hardly any activity, however he tells me that he gets short of breath probably harder for him that would be for someone with both legs. Says he really does not do much activity, it is hard to  tell the level of exertion that he is able to perform without symptoms, however he is concerned about getting back into rehabilitation being at risk for cardiac issues. He has not had any exertional chest pain. He does not have PND or orthopnea and only has minimal edema in the left (non-amputated) leg. He has not really been noted to any walking type exercise for over a year because of his foot ulcer and then amputation.  Lasted a little further about his exertional symptoms, he [place] knowledge that on occasion when he's struggling hers or going up steps he may notice a little bit of discomfort in his chest that he felt was probably more musculoskeletal, but in light of his dyspnea is concerned.   No palpitations, lightheadedness, dizziness, weakness or syncope/near syncope. No TIA/amaurosis fugax symptoms. No melena, hematochezia, hematuria, or epstaxis. No claudication.  ROS: A comprehensive was performed.Pertinent positives noted in history of present illness Review of Systems  Constitutional: Negative for malaise/fatigue (He mostly just doesn't do anything. Says he is very sedentary/lazy and has not yet been motivated to try walking again on his prosthetic leg).  HENT: Negative for congestion.   Respiratory: Positive for shortness of breath.   Gastrointestinal: Negative for blood in stool and melena.  Genitourinary: Negative for hematuria.  Skin: Negative.   Neurological: Negative for weakness.       Still trying to get his balance back with his prosthetic leg  Endo/Heme/Allergies: Negative for environmental allergies.  Psychiatric/Behavioral: Negative for memory loss. The patient has insomnia. The patient is not nervous/anxious.   All other systems reviewed and are  negative.  I have reviewed and (if needed) personally updated the patient's problem list, medications, allergies, past medical and surgical history, social and family history.   Past Medical History:  Diagnosis Date  .  Asthma    ass a child  . Cataract   . Depression   . Diabetes mellitus without complication ([place])    Type II  . Hyperlipidemia   . Neuropathy     Past Surgical History:  Procedure Laterality Date  . AMPUTATION Right 12/12/2015   Procedure: Right Transmetatarsal Amputation With [facility] Placement;  Surgeon: [name], MD;  Location: [facility];  Service: Orthopedics;  Laterality: Right;  . AMPUTATION Right 03/05/2016   Procedure: RIGHT BELOW KNEE AMPUTATION;  Surgeon: [name], MD;  Location: [place];  Service: Orthopedics;  Laterality: Right;  . COLONOSCOPY    . EYE SURGERY Left    Cataract  . EYE SURGERY Right    r  . STUMP REVISION Right 11/10/2016   Procedure: REVISION RIGHT BELOW KNEE AMPUTATION;  Surgeon: [name], MD;  Location: [place];  Service: Orthopedics;  Laterality: Right;  . toe removal Right 2012,2014    Current Meds  Medication Sig  . atorvastatin (LIPITOR) 20 MG tablet Take 1 tablet (20 mg total) by mouth daily.  . Cholecalciferol (VITAMIN D3) 50000 units CAPS Take 50,000 Units by mouth once a week. (Patient taking differently: Take 50,000 Units by mouth every Sunday. )  . glipiZIDE (GLUCOTROL) 10 MG tablet Take 1 tablet (10 mg total) by mouth 2 (two) times daily.  . insulin aspart (NOVOLOG FLEXPEN) 100 UNIT/ML FlexPen 6 to 12 units three times daily with meals (Patient taking differently: Inject 10 Units into the skin 3 (three) times daily with meals. )  . LANTUS SOLOSTAR 100 UNIT/ML Solostar Pen INJECT UP TO 45 UNITS TWICE A DAY (Patient taking differently: INJECT 40 UNITS SQ TWICE A DAY)  . Multiple Vitamin (MULTIVITAMIN) capsule Take 1 capsule by mouth daily.  . sertraline (ZOLOFT) 20 MG/ML concentrated solution Take 20 mg by mouth daily.    Allergies  Allergen Reactions  . Bactrim [Sulfamethoxazole-Trimethoprim] Itching  . Gentamicin Other (See Comments)    Unknown    Social History   Social History  . Marital status: Married    Spouse name:  N/A  . Number of children: N/A  . Years of education: N/A   Social History Main Topics  . Smoking status: Never Smoker  . Smokeless tobacco: Never Used  . Alcohol use No  . Drug use: No  . Sexual activity: Not Asked   Other Topics Concern  . None   Social History Narrative  . None    family history includes Diabetes in his brother and sister; Heart disease in his father.  Wt Readings from Last 3 Encounters:  11/25/16 282 lb 3.2 oz (128 kg)  11/15/16 280 lb (127 kg)  11/10/16 280 lb (127 kg)    PHYSICAL EXAM BP 126/70 Comment: left arm  Pulse 87   Ht 6\' 3"  (1.905 m)   Wt 282 lb 3.2 oz (128 kg)   BMI 35.27 kg/m  General appearance: alert, cooperative, appears stated age, no distress and moderately obese.  His seated in wheelchair status post BKA. Well-nourished well-groomed HEENT: [place]/AT, EOMI, MMM, anicteric sclera Neck: no adenopathy, no carotid bruit and no JVD Lungs: clear to auscultation bilaterally, normal percussion bilaterally and non-labored Heart: regular rate and rhythm, S1 & S2 normal, no murmur, click, rub or  gallop; non-displaced PMI. Abdomen: soft, non-tender; bowel sounds normal; no masses,  no organomegaly; no HJR Extremities: extremities normal, atraumatic, no cyanosis, and edema - trace in L LE.; s/p R BKA - stump in Ace-wrap. Pulses: 2+ and symmetric UE. Mildly diminished in LLE. Skin: mobility and turgor normal, no evidence of bleeding or bruising, no lesions noted, temperature normal, texture normal and mild PAD changes in L Leg. Neurologic: Mental status: Alert, oriented, thought content appropriate; pleasant mood and affect. Besides his amputated leg, strength 5 out of 5 throughout.; Cranial nerves: normal (II-XII grossly intact)    Adult ECG Report  Rate: 87 ;  Rhythm: normal sinus rhythm and Borderline rightward axis (-21). RSR prime Exclude incomplete right bundle branch block. Rule out inferior MI, age indeterminate.;   Narrative  Interpretation: Borderline abnormal EKG - no change since May 2017   Other studies Reviewed: Additional studies/ records that were reviewed today include:  Recent Labs:   Lab Results  Component Value Date   LDLDIRECT 73 06/29/2016   Lab Results  Component Value Date   CREATININE 1.17 11/10/2016   BUN 21 (H) 11/10/2016   NA 140 11/10/2016   K 4.5 11/10/2016   CL 106 11/10/2016   CO2 22 11/10/2016    ASSESSMENT / PLAN: Problem List Items Addressed This Visit    Below knee amputation status ([place])   Relevant Orders   Myocardial Perfusion Imaging   Chest discomfort    I suspect his chest discomfort is not anginal, however in light of his dyspnea and risk factors, cannot exclude an anginal component. With the extent of exertional dyspnea,, and family history with other risk factors it is reasonable to evaluate with Myoview stress test.      Relevant Orders   Myocardial Perfusion Imaging   EKG 12-Lead   DM2 (diabetes mellitus, type 2) (HCC)   Relevant Orders   Myocardial Perfusion Imaging   DOE (dyspnea on exertion) - Primary    Exertional dyspnea in the gentleman with family history of CAD and diabetes mellitus, located by peripheral rash or disease is of course concerning for potential anginal equivalent. I do agree that he probably warrants some type of ischemic evaluation and clearly cannot walk on treadmill.  Plan: Lexiscan Myoview Stress Test      Relevant Orders   Myocardial Perfusion Imaging   EKG 12-Lead   HLD (hyperlipidemia)    He is on atorvastatin. He has been followed by PCP. Depending on his breakdown, either low HDL and high triglycerides, history of diabetes, obesity and hypertension consistent with metabolic syndrome which puts her at higher risk for CAD.      Relevant Orders   Myocardial Perfusion Imaging   S/P unilateral BKA (below knee amputation), right ([place])    Would clearly not be old walk on treadmill. Therefore we are limited her stress test  evaluation with a Myoview stress test.       Other Visit Diagnoses    PAD (peripheral artery disease) ([place])       Relevant Orders   Myocardial Perfusion Imaging   EKG 12-Lead   Family hx of ischem heart dis and oth dis of the circ sys       Relevant Orders   Myocardial Perfusion Imaging   EKG 12-Lead      Current medicines are reviewed at length with the patient today. (+/- concerns) n/a The following changes have been made: n/a  Patient Instructions  SCHEDULE AT [place]  physician has requested that you have a lexiscan myoview. For further information please visit [email or web address]. Please follow instruction sheet, as given.     NO OTHER CHANGES     Your physician recommends that you schedule a follow-up appointment in [place].     Studies Ordered:   Orders Placed This Encounter  Procedures  . Myocardial Perfusion Imaging  . EKG 12-Lead      Glenetta Hew, M.D., M.S. Interventional Cardiologist   Pager # [phone] Phone # [phone] [address] [place], [place] [address]

## 2016-11-25 NOTE — Patient Instructions (Addendum)
SCHEDULE AT [place] has requested that you have a lexiscan myoview. For further information please visit [email or web address]. Please follow instruction sheet, as given.     NO OTHER CHANGES     Your physician recommends that you schedule a follow-up appointment in [place].

## 2016-11-27 ENCOUNTER — Encounter: Payer: Self-pay | Admitting: Cardiology

## 2016-11-27 DIAGNOSIS — R0789 Other chest pain: Secondary | ICD-10-CM | POA: Insufficient documentation

## 2016-11-27 NOTE — Assessment & Plan Note (Signed)
I suspect his chest discomfort is not anginal, however in light of his dyspnea and risk factors, cannot exclude an anginal component. With the extent of exertional dyspnea,, and family history with other risk factors it is reasonable to evaluate with Myoview stress test.

## 2016-11-27 NOTE — Assessment & Plan Note (Signed)
Would clearly not be old walk on treadmill. Therefore we are limited her stress test evaluation with a Myoview stress test.

## 2016-11-27 NOTE — Assessment & Plan Note (Addendum)
He is on atorvastatin. He has been followed by PCP. Depending on his breakdown, either low HDL and high triglycerides, history of diabetes, obesity and hypertension consistent with metabolic syndrome which puts her at higher risk for CAD.

## 2016-11-27 NOTE — Assessment & Plan Note (Addendum)
Exertional dyspnea in the gentleman with family history of CAD and diabetes mellitus, located by peripheral rash or disease is of course concerning for potential anginal equivalent. I do agree that he probably warrants some type of ischemic evaluation and clearly cannot walk on treadmill.  Plan: Rick Lane Call Stress Test

## 2016-11-29 ENCOUNTER — Ambulatory Visit (INDEPENDENT_AMBULATORY_CARE_PROVIDER_SITE_OTHER): Payer: Medicare Other | Admitting: Family

## 2016-11-29 ENCOUNTER — Ambulatory Visit (INDEPENDENT_AMBULATORY_CARE_PROVIDER_SITE_OTHER): Payer: Medicare Other | Admitting: Orthopedic Surgery

## 2016-11-29 DIAGNOSIS — M86461 Chronic osteomyelitis with draining sinus, right tibia and fibula: Secondary | ICD-10-CM

## 2016-11-29 DIAGNOSIS — Z89511 Acquired absence of right leg below knee: Secondary | ICD-10-CM

## 2016-11-29 NOTE — Progress Notes (Signed)
   Post-Op Visit Note   Patient: Rick Lane           Date of Birth: 1951/07/07           MRN: [id number] Visit Date: 11/29/2016 PCP: [name], MD  Chief Complaint: No chief complaint on file.   HPI:  The patient is a 66 year old gentleman seen today in follow-up status post excision of the right fibula. Status post right below the knee amputation. This is healing well. Sutures will be harvested today.    Ortho Exam The incision is approximated proximally. Distally this has gaped open some. There is no depth to the wound it is 3 cm x 2 cm this is 1 mm deep filled in with flat pink tissue there is no drainage no odor no straining erythema or tenderness  Visit Diagnoses:  1. S/P unilateral BKA (below knee amputation), right ([place])   2. 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 Without incident. Continue with dry dressing changes daily following wound cleansing. Continue with the medical compression shrinker.  Follow-Up Instructions: Return in about 3 weeks (around 12/20/2016).   Imaging: No results found.  Orders:  No orders of the defined types were placed in this encounter.  No orders of the defined types were placed in this encounter.    PMFS History: Patient Active Problem List   Diagnosis Date Noted  . Chest discomfort 11/27/2016  . DOE (dyspnea on exertion) 11/25/2016  . 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 Non-pressure chronic ulcer of right calf, limited to breakdown of skin ([place]) 07/23/2016  . Below knee amputation status ([place]) 03/05/2016  . S/P unilateral BKA (below knee amputation), right ([place]) 12/12/2015  . Healthcare maintenance 11/14/2015  . HLD (hyperlipidemia) 11/14/2015  . DM2 (diabetes mellitus, type 2) ([place]) 08/11/2015  . Depression 08/11/2015  . Vitamin D deficiency 08/11/2015  . Asthma 08/11/2015   Past Medical History:  Diagnosis Date  .  Asthma    ass a child  . Cataract   . Depression   . Diabetes mellitus without complication ([place])    Type II  . Hyperlipidemia   . Neuropathy     Family History  Problem Relation Age of Onset  . Heart disease Father   . Diabetes Sister   . Diabetes Brother   . Heart attack Paternal Grandfather     Past Surgical History:  Procedure Laterality Date  . AMPUTATION Right 12/12/2015   Procedure: Right Transmetatarsal Amputation With [facility] Placement;  Surgeon: [name], MD;  Location: [place];  Service: Orthopedics;  Laterality: Right;  . AMPUTATION Right 03/05/2016   Procedure: RIGHT BELOW KNEE AMPUTATION;  Surgeon: [name], MD;  Location: [place];  Service: Orthopedics;  Laterality: Right;  . COLONOSCOPY    . EYE SURGERY Left    Cataract  . EYE SURGERY Right    r  . STUMP REVISION Right 11/10/2016   Procedure: REVISION RIGHT BELOW KNEE AMPUTATION;  Surgeon: [name], MD;  Location: [place];  Service: Orthopedics;  Laterality: Right;  . toe removal Right 2012,2014   Social History   Occupational History  . Tax inspector   Social History Main Topics  . Smoking status: Never Smoker  . Smokeless tobacco: Never Used  . Alcohol use No  . Drug use: No  . Sexual activity: Not on file

## 2016-11-30 ENCOUNTER — Telehealth (HOSPITAL_COMMUNITY): Payer: Self-pay

## 2016-11-30 NOTE — Telephone Encounter (Signed)
UTR x2 Encounter complete.

## 2016-11-30 NOTE — Telephone Encounter (Signed)
No answer. No message legt.

## 2016-12-01 ENCOUNTER — Ambulatory Visit (HOSPITAL_COMMUNITY)
Admission: RE | Admit: 2016-12-01 | Discharge: 2016-12-01 | Disposition: A | Discharge status: Home / Self Care | Payer: Medicare Other | Source: Ambulatory Visit | Attending: Cardiology | Admitting: Cardiology

## 2016-12-01 DIAGNOSIS — E669 Obesity, unspecified: Secondary | ICD-10-CM | POA: Insufficient documentation

## 2016-12-01 DIAGNOSIS — E784 Other hyperlipidemia: Secondary | ICD-10-CM | POA: Diagnosis not present

## 2016-12-01 DIAGNOSIS — Z6835 Body mass index (BMI) 35.0-35.9, adult: Secondary | ICD-10-CM | POA: Insufficient documentation

## 2016-12-01 DIAGNOSIS — E7849 Other hyperlipidemia: Secondary | ICD-10-CM

## 2016-12-01 DIAGNOSIS — E1152 Type 2 diabetes mellitus with diabetic peripheral angiopathy with gangrene: Secondary | ICD-10-CM | POA: Insufficient documentation

## 2016-12-01 DIAGNOSIS — R0789 Other chest pain: Secondary | ICD-10-CM | POA: Insufficient documentation

## 2016-12-01 DIAGNOSIS — I739 Peripheral vascular disease, unspecified: Secondary | ICD-10-CM | POA: Diagnosis not present

## 2016-12-01 DIAGNOSIS — Z89511 Acquired absence of right leg below knee: Secondary | ICD-10-CM | POA: Diagnosis not present

## 2016-12-01 DIAGNOSIS — R0609 Other forms of dyspnea: Secondary | ICD-10-CM

## 2016-12-01 DIAGNOSIS — Z794 Long term (current) use of insulin: Secondary | ICD-10-CM | POA: Insufficient documentation

## 2016-12-01 DIAGNOSIS — R0600 Dyspnea, unspecified: Secondary | ICD-10-CM

## 2016-12-01 DIAGNOSIS — Z8249 Family history of ischemic heart disease and other diseases of the circulatory system: Secondary | ICD-10-CM

## 2016-12-01 LAB — MYOCARDIAL PERFUSION IMAGING
CHL CUP NUCLEAR SRS: 1
CHL CUP RESTING HR STRESS: 79 {beats}/min
LVDIAVOL: 123 mL (ref 62–150)
LVSYSVOL: 57 mL
Peak HR: 85 {beats}/min
SDS: 0
SSS: 1
TID: 1.5

## 2016-12-01 MED ORDER — TECHNETIUM TC 99M TETROFOSMIN IV KIT
10.8000 | PACK | Freq: Once | INTRAVENOUS | Status: AC | PRN
  Administered 2016-12-01: 10.8 via INTRAVENOUS
  Filled 2016-12-01: qty 11

## 2016-12-01 MED ORDER — REGADENOSON 0.4 MG/5ML IV SOLN
0.4000 mg | Freq: Once | INTRAVENOUS | Status: AC
  Administered 2016-12-01: 0.4 mg via INTRAVENOUS

## 2016-12-01 MED ORDER — TECHNETIUM TC 99M TETROFOSMIN IV KIT
30.4000 | PACK | Freq: Once | INTRAVENOUS | Status: AC | PRN
  Administered 2016-12-01: 30.4 via INTRAVENOUS
  Filled 2016-12-01: qty 31

## 2016-12-05 NOTE — Progress Notes (Signed)
Low risk Myoview stress test. Normal EF.Rick Lane

## 2016-12-13 ENCOUNTER — Ambulatory Visit (INDEPENDENT_AMBULATORY_CARE_PROVIDER_SITE_OTHER): Payer: Medicare Other | Admitting: Family

## 2016-12-13 ENCOUNTER — Telehealth: Payer: Self-pay | Admitting: *Deleted

## 2016-12-13 NOTE — Telephone Encounter (Signed)
-----   Message from Leonie Man, MD sent at 12/05/2016  5:55 PM EDT ----- Low risk Myoview stress test. Normal EF.Rick Lane

## 2016-12-13 NOTE — Telephone Encounter (Signed)
Left detailed message on secure voicemail ( linda) per dpi Any question may call back

## 2016-12-17 ENCOUNTER — Encounter (INDEPENDENT_AMBULATORY_CARE_PROVIDER_SITE_OTHER): Payer: Self-pay | Admitting: Family

## 2016-12-17 ENCOUNTER — Ambulatory Visit (INDEPENDENT_AMBULATORY_CARE_PROVIDER_SITE_OTHER): Payer: Medicare Other | Admitting: Family

## 2016-12-17 DIAGNOSIS — Z89511 Acquired absence of right leg below knee: Secondary | ICD-10-CM

## 2016-12-17 DIAGNOSIS — M86461 Chronic osteomyelitis with draining sinus, right tibia and fibula: Secondary | ICD-10-CM

## 2016-12-17 MED ORDER — DOXYCYCLINE HYCLATE 100 MG PO TABS: 100.0000 mg | ORAL_TABLET | Freq: Two times a day (BID) | ORAL | 0 refills | Status: DC

## 2016-12-17 NOTE — Progress Notes (Signed)
Post-Op Visit Note   Patient: Rick Lane           Date of Birth: March 24, 1951           MRN: [id number] Visit Date: 12/17/2016 PCP: [name], MD  Chief Complaint:  Chief Complaint  Patient presents with  . Right Leg - Routine Post Op    HPI:   The patient is a 66 year old gentleman who presents today status post right below the knee amputation revision on April 18. Patient states he is doing okay. Has had increased tenderness laterally over the area of the incision that has not yet healed. He is not currently taking anything for pain. Is not taking any antibiotics. States his wife does apply Silvadene daily.  Does report moderate drainage daily.  Ortho Exam Incision is overall well healed. There is a 2 cm x 1 cm area that is he not yet healed this is 3 mm deep filled in with granulation tissue. Exudative tissue was debrided with gauze. There is no drainage today. No cellulitis no odor. + tenderness  Visit Diagnoses:  1. 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 S/P unilateral BKA (below knee amputation), right (HCC)     Plan:Wound cleansing daily. Apply Silvadene dressings daily. Have provided a prescription for doxycycline. He'll follow-up in office in 2 more weeks.  Follow-Up Instructions: Return in about 2 weeks (around 12/31/2016).   Imaging: No results found.  Orders:  No orders of the defined types were placed in this encounter.  Meds ordered this encounter  Medications  . doxycycline (VIBRA-TABS) 100 MG tablet    Sig: Take 1 tablet (100 mg total) by mouth 2 (two) times daily.    Dispense:  28 tablet    Refill:  0     PMFS History: Patient Active Problem List   Diagnosis Date Noted  . Chest discomfort 11/27/2016  . DOE (dyspnea on exertion) 11/25/2016  . 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 Non-pressure chronic ulcer of right calf, limited to breakdown of skin ([place]) 07/23/2016  .  Below knee amputation status ([place]) 03/05/2016  . S/P unilateral BKA (below knee amputation), right ([place]) 12/12/2015  . Healthcare maintenance 11/14/2015  . HLD (hyperlipidemia) 11/14/2015  . DM2 (diabetes mellitus, type 2) ([place]) 08/11/2015  . Depression 08/11/2015  . Vitamin D deficiency 08/11/2015  . Asthma 08/11/2015   Past Medical History:  Diagnosis Date  . Asthma    ass a child  . Cataract   . Depression   . Diabetes mellitus without complication ([place])    Type II  . Hyperlipidemia   . Neuropathy     Family History  Problem Relation Age of Onset  . Heart disease Father   . Diabetes Sister   . Diabetes Brother   . Heart attack Paternal Grandfather     Past Surgical History:  Procedure Laterality Date  . AMPUTATION Right 12/12/2015   Procedure: Right Transmetatarsal Amputation With [facility] Placement;  Surgeon: [name], MD;  Location: [facility];  Service: Orthopedics;  Laterality: Right;  . AMPUTATION Right 03/05/2016   Procedure: RIGHT BELOW KNEE AMPUTATION;  Surgeon: [name], MD;  Location: [place];  Service: Orthopedics;  Laterality: Right;  . COLONOSCOPY    . EYE SURGERY Left    Cataract  . EYE SURGERY Right    r  . STUMP REVISION Right 11/10/2016   Procedure: REVISION RIGHT BELOW KNEE AMPUTATION;  Surgeon: [name], MD;  Location: [place];  Service: Orthopedics;  Laterality: Right;  . toe removal Right 2012,2014   Social History   Occupational History  . Tax inspector   Social History Main Topics  . Smoking status: Never Smoker  . Smokeless tobacco: Never Used  . Alcohol use No  . Drug use: No  . Sexual activity: Not on file

## 2016-12-27 NOTE — Addendum Note (Signed)
Addendum  created 12/27/16 1405 by Oleta Mouse, MD   Sign clinical note

## 2016-12-30 ENCOUNTER — Ambulatory Visit (INDEPENDENT_AMBULATORY_CARE_PROVIDER_SITE_OTHER): Payer: Medicare Other | Admitting: Family

## 2016-12-30 DIAGNOSIS — Z89511 Acquired absence of right leg below knee: Secondary | ICD-10-CM

## 2016-12-30 DIAGNOSIS — M86461 Chronic osteomyelitis with draining sinus, right tibia and fibula: Secondary | ICD-10-CM

## 2016-12-30 NOTE — Progress Notes (Signed)
Post-Op Visit Note   Patient: Rick Lane           Date of Birth: 1950-12-07           MRN: [id number] Visit Date: 12/30/2016 PCP: [name], MD  Chief Complaint:  No chief complaint on file.   HPI:  The patient is a 66 year old gentleman who presents today status post right below the knee amputation revision on April 18. Patient states he is doing okay. No dressing applied today. Wife states does do silvadene dressing changes. Does have one remaining Vive shrinker at home. Not currently using it. States his dog ate his other shrinker.   Will complete his Doxycycline tomorrow.     Ortho Exam Incision is overall well healed. There is a 5 mm in diameter area that is he not yet healed this is 2 mm deep filled in with exudative tissue. There is no drainage today. No cellulitis no odor. no tenderness  Visit Diagnoses:  1. 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 S/P unilateral BKA (below knee amputation), right ([place])     Plan: Wound cleansing daily. Apply Silvadene dressings daily.  Recommended he wear his Vive shrinker daily. Once healed to follow with Gerald Stabs for modifications to prosthesis.  Follow-Up Instructions: Return in about 4 weeks (around 01/27/2017).   Imaging: No results found.  Orders:  No orders of the defined types were placed in this encounter.  No orders of the defined types were placed in this encounter.    PMFS History: Patient Active Problem List   Diagnosis Date Noted  . Chest discomfort 11/27/2016  . DOE (dyspnea on exertion) 11/25/2016  . 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 Non-pressure chronic ulcer of right calf, limited to breakdown of skin ([place]) 07/23/2016  . Below knee amputation status ([place]) 03/05/2016  . S/P unilateral BKA (below knee amputation), right ([place]) 12/12/2015  . Healthcare maintenance 11/14/2015  . HLD (hyperlipidemia) 11/14/2015  . DM2 (diabetes  mellitus, type 2) ([place]) 08/11/2015  . Depression 08/11/2015  . Vitamin D deficiency 08/11/2015  . Asthma 08/11/2015   Past Medical History:  Diagnosis Date  . Asthma    ass a child  . Cataract   . Depression   . Diabetes mellitus without complication ([place])    Type II  . Hyperlipidemia   . Neuropathy     Family History  Problem Relation Age of Onset  . Heart disease Father   . Diabetes Sister   . Diabetes Brother   . Heart attack Paternal Grandfather     Past Surgical History:  Procedure Laterality Date  . AMPUTATION Right 12/12/2015   Procedure: Right Transmetatarsal Amputation With [facility] Placement;  Surgeon: [name], MD;  Location: [place];  Service: Orthopedics;  Laterality: Right;  . AMPUTATION Right 03/05/2016   Procedure: RIGHT BELOW KNEE AMPUTATION;  Surgeon: [name], MD;  Location: [place];  Service: Orthopedics;  Laterality: Right;  . COLONOSCOPY    . EYE SURGERY Left    Cataract  . EYE SURGERY Right    r  . STUMP REVISION Right 11/10/2016   Procedure: REVISION RIGHT BELOW KNEE AMPUTATION;  Surgeon: [name], MD;  Location: [place];  Service: Orthopedics;  Laterality: Right;  . toe removal Right 2012,2014   Social History   Occupational History  . Tax inspector   Social History Main Topics  . Smoking  status: Never Smoker  . Smokeless tobacco: Never Used  . Alcohol use No  . Drug use: No  . Sexual activity: Not on file

## 2016-12-31 ENCOUNTER — Ambulatory Visit: Payer: Medicare Other | Admitting: Cardiology

## 2017-01-14 ENCOUNTER — Other Ambulatory Visit: Payer: Self-pay | Admitting: Family Medicine

## 2017-01-17 ENCOUNTER — Ambulatory Visit: Payer: Medicare Other | Admitting: Cardiology

## 2017-01-17 ENCOUNTER — Encounter: Payer: Self-pay | Admitting: *Deleted

## 2017-01-25 ENCOUNTER — Other Ambulatory Visit: Payer: Self-pay | Admitting: *Deleted

## 2017-01-25 MED ORDER — INSULIN GLARGINE 100 UNIT/ML SOLOSTAR PEN: PEN_INJECTOR | SUBCUTANEOUS | 0 refills | Status: DC

## 2017-01-27 ENCOUNTER — Ambulatory Visit (INDEPENDENT_AMBULATORY_CARE_PROVIDER_SITE_OTHER): Payer: Medicare Other | Admitting: Orthopedic Surgery

## 2017-01-27 ENCOUNTER — Encounter (INDEPENDENT_AMBULATORY_CARE_PROVIDER_SITE_OTHER): Payer: Self-pay | Admitting: Orthopedic Surgery

## 2017-01-27 DIAGNOSIS — Z794 Long term (current) use of insulin: Secondary | ICD-10-CM

## 2017-01-27 DIAGNOSIS — M86461 Chronic osteomyelitis with draining sinus, right tibia and fibula: Secondary | ICD-10-CM

## 2017-01-27 DIAGNOSIS — Z89511 Acquired absence of right leg below knee: Secondary | ICD-10-CM

## 2017-01-27 DIAGNOSIS — E1142 Type 2 diabetes mellitus with diabetic polyneuropathy: Secondary | ICD-10-CM

## 2017-01-27 NOTE — Progress Notes (Signed)
Post-Op Visit Note   Patient: Rick Lane           Date of Birth: [birth year]           MRN: [id number] Visit Date: 01/27/2017 PCP: [name], MD  Chief Complaint:  Chief Complaint  Patient presents with  . Right Leg - Follow-up    11/10/16 Revision Right Below Knee Amputation    HPI:  The patient is 66 year old gentleman who presents today in follow-up. He status post revision of his right below the knee amputation on April 18 of this year. Has been doing daily dressing changes with Silvadene. States is wearing his Editor, commissioning daily. States that he has follow-up scheduled with Gerald Stabs from [place] for modifications to his prosthesis. He is concerned about swelling in his liner.    Ortho Exam On exam the right below the knee amputation has healed well there is no open area and no erythema no drainage no odor no sign of infection.  Visit Diagnoses:  1. Status post below knee amputation of right lower extremity ([place])   2. 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 Type 2 diabetes mellitus with diabetic polyneuropathy, with long-term current use of insulin ([place])     Plan: Follow with [facility] for prosthesis needs. He'll follow-up in office with [place] as needed.  Follow-Up Instructions: Return if symptoms worsen or fail to improve.   Imaging: No results found.  Orders:  No orders of the defined types were placed in this encounter.  No orders of the defined types were placed in this encounter.    PMFS History: Patient Active Problem List   Diagnosis Date Noted  . Chest discomfort 11/27/2016  . DOE (dyspnea on exertion) 11/25/2016  . 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 Non-pressure chronic ulcer of right calf, limited to breakdown of skin ([place]) 07/23/2016  . Below knee amputation status ([place]) 03/05/2016  . S/P unilateral BKA (below knee amputation), right ([place]) 12/12/2015  . Healthcare maintenance  11/14/2015  . HLD (hyperlipidemia) 11/14/2015  . DM2 (diabetes mellitus, type 2) ([place]) 08/11/2015  . Depression 08/11/2015  . Vitamin D deficiency 08/11/2015  . Asthma 08/11/2015   Past Medical History:  Diagnosis Date  . Asthma    ass a child  . Cataract   . Depression   . Diabetes mellitus without complication ([place])    Type II  . Hyperlipidemia   . Neuropathy     Family History  Problem Relation Age of Onset  . Heart disease Father   . Diabetes Sister   . Diabetes Brother   . Heart attack Paternal Grandfather     Past Surgical History:  Procedure Laterality Date  . AMPUTATION Right 12/12/2015   Procedure: Right Transmetatarsal Amputation With [facility] Placement;  Surgeon: [name], MD;  Location: [place] [facility];  Service: Orthopedics;  Laterality: Right;  . AMPUTATION Right 03/05/2016   Procedure: RIGHT BELOW KNEE AMPUTATION;  Surgeon: [name], MD;  Location: [place];  Service: Orthopedics;  Laterality: Right;  . COLONOSCOPY    . EYE SURGERY Left    Cataract  . EYE SURGERY Right    r  . STUMP REVISION Right 11/10/2016   Procedure: REVISION RIGHT BELOW KNEE AMPUTATION;  Surgeon: [name], MD;  Location: [place];  Service: Orthopedics;  Laterality: Right;  . toe removal Right 2012,2014   Social History   Occupational History  .  Tax inspector   Social History Main Topics  . Smoking status: Never Smoker  . Smokeless tobacco: Never Used  . Alcohol use No  . Drug use: No  . Sexual activity: Not on file

## 2017-03-23 ENCOUNTER — Other Ambulatory Visit: Payer: Self-pay | Admitting: Family Medicine

## 2017-04-14 ENCOUNTER — Other Ambulatory Visit: Payer: Self-pay | Admitting: Family Medicine

## 2017-04-14 NOTE — Telephone Encounter (Signed)
Last seen 06/29/16  Dr Wendi Snipes

## 2017-04-15 ENCOUNTER — Other Ambulatory Visit: Payer: Self-pay | Admitting: Family Medicine

## 2017-04-15 ENCOUNTER — Other Ambulatory Visit: Payer: Self-pay

## 2017-04-15 MED ORDER — INSULIN GLARGINE 100 UNIT/ML SOLOSTAR PEN: PEN_INJECTOR | SUBCUTANEOUS | 0 refills | Status: DC

## 2017-04-18 NOTE — Telephone Encounter (Signed)
Last lipid 07/09/16  Dr Wendi Snipes

## 2017-04-28 ENCOUNTER — Ambulatory Visit (INDEPENDENT_AMBULATORY_CARE_PROVIDER_SITE_OTHER): Payer: Medicare Other | Admitting: Family Medicine

## 2017-04-28 ENCOUNTER — Encounter: Payer: Self-pay | Admitting: Family Medicine

## 2017-04-28 VITALS — BP 127/69 | HR 86 | Temp 97.1°F | Ht 75.0 in | Wt 292.2 lb

## 2017-04-28 DIAGNOSIS — E1142 Type 2 diabetes mellitus with diabetic polyneuropathy: Secondary | ICD-10-CM | POA: Diagnosis not present

## 2017-04-28 DIAGNOSIS — Z89511 Acquired absence of right leg below knee: Secondary | ICD-10-CM | POA: Diagnosis not present

## 2017-04-28 DIAGNOSIS — E559 Vitamin D deficiency, unspecified: Secondary | ICD-10-CM | POA: Diagnosis not present

## 2017-04-28 DIAGNOSIS — Z794 Long term (current) use of insulin: Secondary | ICD-10-CM

## 2017-04-28 LAB — BAYER DCA HB A1C WAIVED: HB A1C (BAYER DCA - WAIVED): 7.9 % — ABNORMAL HIGH (ref <7.0)

## 2017-04-28 MED ORDER — HYDROCORTISONE 2.5 % EX CREA: TOPICAL_CREAM | Freq: Two times a day (BID) | CUTANEOUS | 2 refills | Status: DC

## 2017-04-28 MED ORDER — DULAGLUTIDE 0.75 MG/0.5ML ~~LOC~~ SOAJ: 0.7500 mg | SUBCUTANEOUS | 5 refills | Status: DC

## 2017-04-28 NOTE — Patient Instructions (Signed)
Great to see you!  Stop glipizide ( I think it is likely causing more risk than help)   Start trulicity 1 injection once weekly

## 2017-04-28 NOTE — Progress Notes (Signed)
   HPI  Patient presents today for follow-up chronic medical conditions.  Diabetes Average fasting blood sugar is 120-160. Hypoglycemia 2 or 3 episodes as low as 60. Patient is taking 40 units of Lantus twice daily plus glipizide twice daily. Diet moderately. Minimally active.  Vitamin D deficiency Needs refill, has not had repeat labs in quite some time  BKA Patient requesting gait training, seems to be doing well, lesion has completely healed   PMH: Smoking status noted ROS: Per HPI  Objective: BP 127/69   Pulse 86   Temp (!) 97.1 F (36.2 C) (Oral)   Ht '6\' 3"'$  (1.905 m)   Wt 292 lb 3.2 oz (132.5 kg)   BMI 36.52 kg/m  Gen: NAD, alert, cooperative with exam HEENT: NCAT CV: RRR, good S1/S2 Resp: CTABL, no wheezes, non-labored Ext: No edema, warm Neuro: Alert and oriented, No gross deficits Diabetic Foot Exam - Simple   Simple Foot Form Diabetic Foot exam was performed with the following findings:  Yes 04/28/2017  1:52 PM  Visual Inspection See comments:  Yes Sensation Testing See comments:  Yes Pulse Check See comments:  Yes Comments Left foot with 2 plaster sounds pedis pulses Scaling and left foot Right foot status post BKA Sensation absent on left with monofilament up to the knee      Assessment and plan:  # Type 2 diabetes Controlled, controlled worsening. A1c 7.9 Patient with some hypoglycemia Recommended discontinue glipizide, starting trulicity  F/u 4-8 weeks  # Status post BKA Physical therapy referral for gait training, doing well  # D deficiency Repeat labs, likely daily replacement     Orders Placed This Encounter  Procedures  . Bayer DCA Hb A1c Waived  . CMP14+EGFR  . CBC with Differential/Platelet  . LDL Cholesterol, Direct  . VITAMIN D 25 Hydroxy (Vit-D Deficiency, Fractures)  . Ambulatory referral to Physical Therapy    Referral Priority:   Routine    Referral Type:   Physical Medicine    Referral Reason:   Specialty  Services Required    Requested Specialty:   Physical Therapy    Number of Visits Requested:   1    Meds ordered this encounter  Medications  . Dulaglutide (TRULICITY) 6.19 JK/9.3OI SOPN    Sig: Inject 0.75 mg into the skin once a week.    Dispense:  4 pen    Refill:  5  . hydrocortisone 2.5 % cream    Sig: Apply topically 2 (two) times daily.    Dispense:  30 g    Refill:  Kathleen, MD [facility] 04/28/2017, 1:54 PM

## 2017-04-29 ENCOUNTER — Other Ambulatory Visit: Payer: Self-pay

## 2017-04-29 LAB — CBC WITH DIFFERENTIAL/PLATELET
BASOS: 0 %
Basophils Absolute: 0 10*3/uL (ref 0.0–0.2)
EOS (ABSOLUTE): 0.2 10*3/uL (ref 0.0–0.4)
Eos: 4 %
Hematocrit: 41.4 % (ref 37.5–51.0)
Hemoglobin: 13.4 g/dL (ref 13.0–17.7)
IMMATURE GRANULOCYTES: 0 %
Immature Grans (Abs): 0 10*3/uL (ref 0.0–0.1)
LYMPHS ABS: 1.1 10*3/uL (ref 0.7–3.1)
Lymphs: 23 %
MCH: 29.1 pg (ref 26.6–33.0)
MCHC: 32.4 g/dL (ref 31.5–35.7)
MCV: 90 fL (ref 79–97)
Monocytes Absolute: 0.4 10*3/uL (ref 0.1–0.9)
Monocytes: 9 %
NEUTROS PCT: 64 %
Neutrophils Absolute: 3.1 10*3/uL (ref 1.4–7.0)
PLATELETS: 184 10*3/uL (ref 150–379)
RBC: 4.61 x10E6/uL (ref 4.14–5.80)
RDW: 14.6 % (ref 12.3–15.4)
WBC: 4.9 10*3/uL (ref 3.4–10.8)

## 2017-04-29 LAB — CMP14+EGFR
A/G RATIO: 2.2 (ref 1.2–2.2)
ALK PHOS: 46 IU/L (ref 39–117)
ALT: 34 IU/L (ref 0–44)
AST: 18 IU/L (ref 0–40)
Albumin: 4.4 g/dL (ref 3.6–4.8)
BUN/Creatinine Ratio: 19 (ref 10–24)
BUN: 23 mg/dL (ref 8–27)
Bilirubin Total: 0.4 mg/dL (ref 0.0–1.2)
CO2: 23 mmol/L (ref 20–29)
Calcium: 9.6 mg/dL (ref 8.6–10.2)
Chloride: 99 mmol/L (ref 96–106)
Creatinine, Ser: 1.2 mg/dL (ref 0.76–1.27)
GFR calc Af Amer: 73 mL/min/{1.73_m2} (ref >59)
GFR calc non Af Amer: 63 mL/min/{1.73_m2} (ref >59)
Globulin, Total: 2 g/dL (ref 1.5–4.5)
Glucose: 227 mg/dL — ABNORMAL HIGH (ref 65–99)
POTASSIUM: 4.7 mmol/L (ref 3.5–5.2)
SODIUM: 139 mmol/L (ref 134–144)
Total Protein: 6.4 g/dL (ref 6.0–8.5)

## 2017-04-29 LAB — VITAMIN D 25 HYDROXY (VIT D DEFICIENCY, FRACTURES): VIT D 25 HYDROXY: 37.4 ng/mL (ref 30.0–100.0)

## 2017-04-29 LAB — LDL CHOLESTEROL, DIRECT: LDL Direct: 133 mg/dL — ABNORMAL HIGH (ref 0–99)

## 2017-04-29 MED ORDER — INSULIN GLARGINE 100 UNIT/ML SOLOSTAR PEN: PEN_INJECTOR | SUBCUTANEOUS | 0 refills | Status: DC

## 2017-05-12 ENCOUNTER — Ambulatory Visit: Payer: Medicare Other | Admitting: Physical Therapy

## 2017-05-30 ENCOUNTER — Encounter: Payer: Self-pay | Admitting: Family Medicine

## 2017-05-30 ENCOUNTER — Ambulatory Visit: Payer: Medicare Other | Admitting: Family Medicine

## 2017-06-09 ENCOUNTER — Ambulatory Visit: Payer: Medicare Other | Admitting: Family Medicine

## 2017-06-10 ENCOUNTER — Encounter: Payer: Self-pay | Admitting: Family Medicine

## 2017-06-10 ENCOUNTER — Ambulatory Visit (INDEPENDENT_AMBULATORY_CARE_PROVIDER_SITE_OTHER): Payer: Medicare Other | Admitting: Family Medicine

## 2017-06-10 VITALS — BP 131/71 | HR 91 | Temp 97.5°F | Ht 75.0 in | Wt 297.0 lb

## 2017-06-10 DIAGNOSIS — F329 Major depressive disorder, single episode, unspecified: Secondary | ICD-10-CM | POA: Diagnosis not present

## 2017-06-10 DIAGNOSIS — Z794 Long term (current) use of insulin: Secondary | ICD-10-CM

## 2017-06-10 DIAGNOSIS — F32A Depression, unspecified: Secondary | ICD-10-CM

## 2017-06-10 DIAGNOSIS — E7849 Other hyperlipidemia: Secondary | ICD-10-CM

## 2017-06-10 DIAGNOSIS — E1142 Type 2 diabetes mellitus with diabetic polyneuropathy: Secondary | ICD-10-CM

## 2017-06-10 MED ORDER — SERTRALINE HCL 100 MG PO TABS: 100.0000 mg | ORAL_TABLET | Freq: Every day | ORAL | 0 refills | Status: DC

## 2017-06-10 MED ORDER — DULAGLUTIDE 1.5 MG/0.5ML ~~LOC~~ SOAJ: 1.5000 mg | SUBCUTANEOUS | 5 refills | Status: DC

## 2017-06-10 MED ORDER — ATORVASTATIN CALCIUM 20 MG PO TABS: 20.0000 mg | ORAL_TABLET | Freq: Every day | ORAL | 3 refills | Status: DC

## 2017-06-10 NOTE — Progress Notes (Signed)
   HPI  Patient presents today here to follow-up for diabetes.  Patient is still taking the same dose of Lantus and NovoLog.  He started Trulicity and is tolerating it well. Average fasting blood sugar is 150.  Patient is having some logistic issues trying to get to a specialized physical therapist in [place], he needs to call and make an appointment, we discussed this.  Anemia-needs refill of Lipitor, tolerating well  Depression needs refill of Zoloft, doing well  PMH: Smoking status noted ROS: Per HPI  Objective: BP 131/71   Pulse 91   Temp (!) 97.5 F (36.4 C) (Oral)   Ht 6\' 3"  (1.905 m)   Wt 297 lb (134.7 kg)   BMI 37.12 kg/m  Gen: NAD, alert, cooperative with exam HEENT: NCAT CV: RRR, good S1/S2, no murmur Resp: CTABL, no wheezes, non-labored Abd: SNTND, BS present, no guarding or organomegaly Ext: RLE BKA with prosthesis  Neuro: Alert and oriented, No gross deficits  Depression screen [facility] 2/9 06/10/2017 04/28/2017 10/15/2016 06/29/2016 03/26/2016  Decreased Interest 0 0 0 0 0  Down, Depressed, Hopeless 0 0 0 0 0  PHQ - 2 Score 0 0 0 0 0     Assessment and plan:  #type 2 diabetes Average fasting blood sugar around 150, reasonably well controlled Patient on high dose insulin, 45 units of Lantus twice daily.  Plus NovoLog. Tolerating Trulicity, titrate to 1.5 mg weekly. Follow-up 2 months  #Hyperlipidemia Reasonably well controlled, labs up-to-date Refill Lipitor  #Depression Stable Zoloft refilled, continue     Meds ordered this encounter  Medications  . atorvastatin (LIPITOR) 20 MG tablet    Sig: Take 1 tablet (20 mg total) daily by mouth.    Dispense:  90 tablet    Refill:  3  . sertraline (ZOLOFT) 100 MG tablet    Sig: Take 1 tablet (100 mg total) daily by mouth.    Dispense:  90 tablet    Refill:  0  . Dulaglutide (TRULICITY) 1.5 ZO/1.0RU SOPN    Sig: Inject 1.5 mg once a week into the skin.    Dispense:  4 pen    Refill:  [place], MD [facility] 06/10/2017, 4:36 PM

## 2017-06-10 NOTE — Patient Instructions (Signed)
Great to see you!  Come back in 2 months to follow up on your diabetes.   I have increased your trulicity to 1.5 mg once weekly.

## 2017-06-19 ENCOUNTER — Other Ambulatory Visit: Payer: Self-pay | Admitting: Family Medicine

## 2017-07-11 ENCOUNTER — Other Ambulatory Visit: Payer: Self-pay | Admitting: Family Medicine

## 2017-07-21 ENCOUNTER — Other Ambulatory Visit: Payer: Self-pay | Admitting: *Deleted

## 2017-07-21 MED ORDER — INSULIN GLARGINE 100 UNIT/ML SOLOSTAR PEN: PEN_INJECTOR | SUBCUTANEOUS | 0 refills | Status: DC

## 2017-07-22 ENCOUNTER — Other Ambulatory Visit: Payer: Self-pay | Admitting: Family Medicine

## 2017-08-11 ENCOUNTER — Encounter: Payer: Self-pay | Admitting: Family Medicine

## 2017-08-11 ENCOUNTER — Ambulatory Visit (INDEPENDENT_AMBULATORY_CARE_PROVIDER_SITE_OTHER): Payer: Medicare HMO | Admitting: Family Medicine

## 2017-08-11 VITALS — BP 114/63 | HR 101 | Temp 97.1°F | Ht 75.0 in | Wt 286.6 lb

## 2017-08-11 DIAGNOSIS — E11621 Type 2 diabetes mellitus with foot ulcer: Secondary | ICD-10-CM

## 2017-08-11 DIAGNOSIS — L97421 Non-pressure chronic ulcer of left heel and midfoot limited to breakdown of skin: Secondary | ICD-10-CM | POA: Diagnosis not present

## 2017-08-11 DIAGNOSIS — E1142 Type 2 diabetes mellitus with diabetic polyneuropathy: Secondary | ICD-10-CM

## 2017-08-11 DIAGNOSIS — Z794 Long term (current) use of insulin: Secondary | ICD-10-CM

## 2017-08-11 LAB — BAYER DCA HB A1C WAIVED: HB A1C: 7.9 % — AB (ref <7.0)

## 2017-08-11 MED ORDER — DULAGLUTIDE 1.5 MG/0.5ML ~~LOC~~ SOAJ: 1.5000 mg | SUBCUTANEOUS | 5 refills | Status: DC

## 2017-08-11 MED ORDER — DOXYCYCLINE HYCLATE 100 MG PO TABS: 100.0000 mg | ORAL_TABLET | Freq: Two times a day (BID) | ORAL | 0 refills | Status: DC

## 2017-08-11 MED ORDER — INSULIN GLARGINE 100 UNIT/ML SOLOSTAR PEN: PEN_INJECTOR | SUBCUTANEOUS | 3 refills | Status: DC

## 2017-08-11 MED ORDER — INSULIN ASPART 100 UNIT/ML FLEXPEN: 10.0000 [IU] | PEN_INJECTOR | Freq: Three times a day (TID) | SUBCUTANEOUS | 3 refills | Status: DC

## 2017-08-11 NOTE — Progress Notes (Signed)
HPI  Patient presents today here for follow-up of chronic medical conditions.  Patient has developed an ulcer on his left foot in the interim.  He states is been there about 1 month.  He denies any significant pain. Patient has significant diabetic neuropathy and is status post right BKA due to diabetic osteomyelitis.  He does not have a podiatrist.  Type 2 diabetes. He has several readings lately in the 300s, his average fasting blood sugars around 170-200. No hypoglycemia.  He has not taken Trulicity in about 1 month, but he needs refills.   PMH: Smoking status noted ROS: Per HPI  Objective: BP 114/63   Pulse (!) 101   Temp (!) 97.1 F (36.2 C) (Oral)   Ht 6\' 3"  (1.905 m)   Wt 286 lb 9.6 oz (130 kg)   BMI 35.82 kg/m  Gen: NAD, alert, cooperative with exam HEENT: NCAT CV: RRR, good S1/S2, no murmur Resp: CTABL, no wheezes, non-labored Abd: SNTND, BS present, no guarding or organomegaly Ext: No edema, warm Neuro: Alert and oriented  Diabetic Foot Exam - Simple   Simple Foot Form Diabetic Foot exam was performed with the following findings:  Yes 08/11/2017  3:49 PM  Visual Inspection See comments:  Yes Sensation Testing See comments:  Yes Pulse Check See comments:  Yes Comments Patient with right-sided BKA Left foot with erythema and heme crusting on the medial portion and 2-3 patches, on the left lateral and she has a 1.6 cm x 4 mm shallow ulceration Sensation is absent with monofilament to the mid calf      Assessment and plan:  #Type 2 diabetes Fairly controlled with A1c of 7.9 Recommended restarting Trulicity Continue current doses of insulin. Patient using 10 units 3 times daily of NovoLog, 40 units of Lantus twice daily. I do not believe he is at risk of hypoglycemia starting Trulicity  #Diabetic foot ulcer - new problem Refer to podiatry, start doxycycline Discussed routine wound care Follow-up 2 weeks  Patient has very significant diabetic  neuropathy    Orders Placed This Encounter  Procedures  . Bayer DCA Hb A1c Waived  . Ambulatory referral to Podiatry    Referral Priority:   Routine    Referral Type:   Consultation    Referral Reason:   Specialty Services Required    Requested Specialty:   Podiatry    Number of Visits Requested:   1    Meds ordered this encounter  Medications  . DISCONTD: doxycycline (VIBRA-TABS) 100 MG tablet    Sig: Take 1 tablet (100 mg total) by mouth 2 (two) times daily. 1 po bid    Dispense:  20 tablet    Refill:  0  . DISCONTD: Dulaglutide (TRULICITY) 1.5 ON/6.2XB SOPN    Sig: Inject 1.5 mg into the skin once a week.    Dispense:  4 pen    Refill:  5  . DISCONTD: Insulin Glargine (LANTUS SOLOSTAR) 100 UNIT/ML Solostar Pen    Sig: INJECT UP TO 45 UNITS TWICE A DAY    Dispense:  270 mL    Refill:  3  . DISCONTD: insulin aspart (NOVOLOG FLEXPEN) 100 UNIT/ML FlexPen    Sig: Inject 10 Units into the skin 3 (three) times daily with meals.    Dispense:  15 pen    Refill:  3  . DISCONTD: doxycycline (VIBRA-TABS) 100 MG tablet    Sig: Take 1 tablet (100 mg total) by mouth 2 (two) times daily. 1 po  bid    Dispense:  28 tablet    Refill:  0    Correction 28 pills  . doxycycline (VIBRA-TABS) 100 MG tablet    Sig: Take 1 tablet (100 mg total) by mouth 2 (two) times daily. 1 po bid    Dispense:  28 tablet    Refill:  0    Correction 28 pills  . Dulaglutide (TRULICITY) 1.5 RZ/7.3VA SOPN    Sig: Inject 1.5 mg into the skin once a week.    Dispense:  4 pen    Refill:  5  . insulin aspart (NOVOLOG FLEXPEN) 100 UNIT/ML FlexPen    Sig: Inject 10 Units into the skin 3 (three) times daily with meals.    Dispense:  15 pen    Refill:  3  . Insulin Glargine (LANTUS SOLOSTAR) 100 UNIT/ML Solostar Pen    Sig: INJECT UP TO 45 UNITS TWICE A DAY    Dispense:  270 mL    Refill:  [place], MD [place] 08/11/2017, 3:27 PM

## 2017-08-11 NOTE — Patient Instructions (Signed)
Great to see you!  I am very concerned about your foot wound on the left foot, I referred you to podiatry and I would like you to start doxycycline today.

## 2017-08-12 ENCOUNTER — Telehealth: Payer: Self-pay | Admitting: *Deleted

## 2017-08-12 MED ORDER — INSULIN DEGLUDEC 200 UNIT/ML ~~LOC~~ SOPN: 45.0000 [IU] | PEN_INJECTOR | Freq: Two times a day (BID) | SUBCUTANEOUS | 11 refills | Status: DC

## 2017-08-12 MED ORDER — DOXYCYCLINE HYCLATE 100 MG PO TABS: 100.0000 mg | ORAL_TABLET | Freq: Two times a day (BID) | ORAL | 0 refills | Status: DC

## 2017-08-12 NOTE — Telephone Encounter (Signed)
Lantus is $100  If changed to [place] or Levemir his cost will be $47  Can the Doxycycline be written for a months worth. He will only take for the 14 days, either way he has to pay $47

## 2017-08-12 NOTE — Telephone Encounter (Signed)
RX's sent to [facility] as requested, change to tresiba.   Laroy Apple, MD [facility] 08/12/2017, 5:28 PM

## 2017-08-15 NOTE — Telephone Encounter (Signed)
An attempt to contact pt was made without a return call in over 3 days, will close encounter.

## 2017-08-29 ENCOUNTER — Encounter: Payer: Self-pay | Admitting: Family Medicine

## 2017-08-29 ENCOUNTER — Ambulatory Visit (INDEPENDENT_AMBULATORY_CARE_PROVIDER_SITE_OTHER): Payer: Medicare HMO | Admitting: Family Medicine

## 2017-08-29 VITALS — BP 133/74 | HR 90 | Temp 97.7°F | Ht 75.0 in | Wt 291.6 lb

## 2017-08-29 DIAGNOSIS — E11621 Type 2 diabetes mellitus with foot ulcer: Secondary | ICD-10-CM

## 2017-08-29 DIAGNOSIS — L97422 Non-pressure chronic ulcer of left heel and midfoot with fat layer exposed: Secondary | ICD-10-CM | POA: Diagnosis not present

## 2017-08-29 NOTE — Progress Notes (Signed)
   HPI  Patient presents today here for follow-up of diabetic foot wound.  Patient was seen about 2 weeks ago and treated with doxycycline for a new onset diabetic foot ulcer. He had a few abrasions on the left foot and an ulcer.  The abrasions have healed well.  He continues to put Silvadene and Vaseline on the foot. He is taking doxycycline as prescribed.  He is willing to see wound care if necessary  PMH: Smoking status noted ROS: Per HPI  Objective: BP 133/74   Pulse 90   Temp 97.7 F (36.5 C) (Oral)   Ht 6\' 3"  (1.905 m)   Wt 291 lb 9.6 oz (132.3 kg)   BMI 36.45 kg/m  Gen: NAD, alert, cooperative with exam HEENT: NCAT CV: RRR, good S1/S2 Resp: CTABL, no wheezes, non-labored Ext: No edema, warm Neuro: Alert and oriented, No gross deficits  L Foot Healing abrasions L foot ulcer stable at 1.6 cm X 4 mm, slightly deeper thi steime, approx 2-4 mm deep  Assessment and plan:  #Diabetic foot ulcer No improvement with doxycycline and wound care Refer to wound care clinic, patient has history of BKA on the right foot due to similar issue  Orders Placed This Encounter  Procedures  . AMB referral to wound care center    Referral Priority:   Routine    Referral Type:   Consultation    Number of Visits Requested:   [place], MD [place] 08/29/2017, 2:25 PM

## 2017-08-29 NOTE — Patient Instructions (Signed)
Great to see you!   

## 2017-08-30 DIAGNOSIS — B351 Tinea unguium: Secondary | ICD-10-CM | POA: Diagnosis not present

## 2017-08-30 DIAGNOSIS — E1142 Type 2 diabetes mellitus with diabetic polyneuropathy: Secondary | ICD-10-CM | POA: Diagnosis not present

## 2017-08-30 DIAGNOSIS — M79676 Pain in unspecified toe(s): Secondary | ICD-10-CM | POA: Diagnosis not present

## 2017-09-20 DIAGNOSIS — L97521 Non-pressure chronic ulcer of other part of left foot limited to breakdown of skin: Secondary | ICD-10-CM | POA: Diagnosis not present

## 2017-10-05 ENCOUNTER — Telehealth: Payer: Self-pay | Admitting: Family Medicine

## 2017-10-10 NOTE — Telephone Encounter (Signed)
LMOVM pt will need appt for this, unless they would like to wait till his appt on 11/09/17

## 2017-10-13 ENCOUNTER — Encounter: Payer: Self-pay | Admitting: Family Medicine

## 2017-10-13 ENCOUNTER — Ambulatory Visit (INDEPENDENT_AMBULATORY_CARE_PROVIDER_SITE_OTHER): Payer: Medicare HMO | Admitting: Family Medicine

## 2017-10-13 VITALS — BP 104/66 | HR 86 | Temp 97.2°F | Ht 75.0 in | Wt 287.6 lb

## 2017-10-13 DIAGNOSIS — T8789 Other complications of amputation stump: Secondary | ICD-10-CM

## 2017-10-13 DIAGNOSIS — M79609 Pain in unspecified limb: Secondary | ICD-10-CM

## 2017-10-13 DIAGNOSIS — Z89511 Acquired absence of right leg below knee: Secondary | ICD-10-CM | POA: Diagnosis not present

## 2017-10-13 DIAGNOSIS — M79604 Pain in right leg: Secondary | ICD-10-CM | POA: Diagnosis not present

## 2017-10-13 NOTE — Progress Notes (Signed)
   HPI  Patient presents today here for stump discomfort.  Patient is now status post right BKA.  He has been doing well with his current prosthesis but it is too large.  He has discomfort due to excess movement within the prosthesis.  He uses an extra sock to provide padding and to reduce movement of the stump within the prosthesis.  He comes in requesting prescription for new prosthesis.  PMH: Smoking status noted ROS: Per HPI  Objective: BP 104/66   Pulse 86   Temp (!) 97.2 F (36.2 C) (Oral)   Ht 6\' 3"  (1.905 m)   Wt 287 lb 9.6 oz (130.5 kg)   BMI 35.95 kg/m  Gen: NAD, alert, cooperative with exam HEENT: NCAT CV: RRR, good S1/S2, no murmur Resp: CTABL, no wheezes, non-labored Ext: Right-sided BKA Neuro: Alert and oriented, No gross deficits   Assessment and plan:  #Status post right-sided BKA, stump pain Patient with discomfort due to a prosthesis that is now too large.  He has lost some weight. Prescription given for new prosthesis, patient will follow-up at the [facility] clinic.    Laroy Apple, MD [facility] 10/13/2017, 1:36 PM

## 2017-10-16 ENCOUNTER — Other Ambulatory Visit: Payer: Self-pay | Admitting: Family Medicine

## 2017-11-09 ENCOUNTER — Ambulatory Visit (INDEPENDENT_AMBULATORY_CARE_PROVIDER_SITE_OTHER): Payer: Medicare HMO | Admitting: Family Medicine

## 2017-11-09 ENCOUNTER — Encounter: Payer: Self-pay | Admitting: Family Medicine

## 2017-11-09 VITALS — BP 133/72 | HR 97 | Temp 97.5°F | Ht 75.0 in | Wt 289.6 lb

## 2017-11-09 DIAGNOSIS — E1142 Type 2 diabetes mellitus with diabetic polyneuropathy: Secondary | ICD-10-CM | POA: Diagnosis not present

## 2017-11-09 DIAGNOSIS — Z794 Long term (current) use of insulin: Secondary | ICD-10-CM

## 2017-11-09 LAB — BAYER DCA HB A1C WAIVED: HB A1C (BAYER DCA - WAIVED): 6.6 % (ref <7.0)

## 2017-11-09 MED ORDER — "INSULIN SYRINGE-NEEDLE U-100 30G X 5/16"" 0.3 ML MISC": 1.0000 | Freq: Three times a day (TID) | 11 refills | Status: DC

## 2017-11-09 MED ORDER — INSULIN REGULAR HUMAN 100 UNIT/ML IJ SOLN: 10.0000 [IU] | Freq: Three times a day (TID) | INTRAMUSCULAR | 11 refills | Status: DC

## 2017-11-09 NOTE — Progress Notes (Signed)
   HPI  Patient presents today here for follow-up diabetes.  Patient his wife stated that medications have been very expensive lately They entered the donut hole in Feb, they are not out yet  He reports no hypoglycemia, Avg fasting CBG 110-140 He can review his insulin regimen easily.   PMH: Smoking status noted ROS: Per HPI  Objective: BP 133/72   Pulse 97   Temp (!) 97.5 F (36.4 C) (Oral)   Ht '6\' 3"'$  (1.905 m)   Wt 289 lb 9.6 oz (131.4 kg)   BMI 36.20 kg/m  Gen: NAD, alert, cooperative with exam HEENT: NCAT CV: RRR, good S1/S2, no murmur Resp: CTABL, no wheezes, non-labored Ext: No edema, warm Neuro: Alert and oriented, No gross deficits  Assessment and plan:  # T2DM Well controlled, however expensive I would like a good alternative to GLP-1 but none that are afforable exist.  COntinue Insulin at current regimen, 46 units BID Tresiba, 10 units novolog which I am changing to Reli-on R today RTC in 3 months  Refer to endo, appreciate their reccs. Sent due to expensive regimen and significant complications of pt     Orders Placed This Encounter  Procedures  . Bayer DCA Hb A1c Waived  . CMP14+EGFR  . Ambulatory referral to Endocrinology    Referral Priority:   Routine    Referral Type:   Consultation    Referral Reason:   Specialty Services Required    Number of Visits Requested:   1    Meds ordered this encounter  Medications  . Insulin Syringe-Needle U-100 (RELION INSULIN SYR 0.3CC/30G) 30G X 5/16" 0.3 ML MISC    Sig: 1 Syringe by Does not apply route 3 (three) times daily.    Dispense:  100 each    Refill:  11  . insulin regular (NOVOLIN R,HUMULIN R) 100 units/mL injection    Sig: Inject 0.1 mLs (10 Units total) into the skin 3 (three) times daily before meals.    Dispense:  10 mL    Refill:  [place], MD [facility] 11/09/2017, 2:57 PM

## 2017-11-10 DIAGNOSIS — Z89511 Acquired absence of right leg below knee: Secondary | ICD-10-CM | POA: Diagnosis not present

## 2017-11-10 LAB — CMP14+EGFR
ALT: 47 IU/L — AB (ref 0–44)
AST: 25 IU/L (ref 0–40)
Albumin/Globulin Ratio: 2.3 — ABNORMAL HIGH (ref 1.2–2.2)
Albumin: 4.3 g/dL (ref 3.6–4.8)
Alkaline Phosphatase: 46 IU/L (ref 39–117)
BILIRUBIN TOTAL: 0.3 mg/dL (ref 0.0–1.2)
BUN/Creatinine Ratio: 19 (ref 10–24)
BUN: 24 mg/dL (ref 8–27)
CALCIUM: 10 mg/dL (ref 8.6–10.2)
CHLORIDE: 105 mmol/L (ref 96–106)
CO2: 24 mmol/L (ref 20–29)
Creatinine, Ser: 1.29 mg/dL — ABNORMAL HIGH (ref 0.76–1.27)
GFR, EST AFRICAN AMERICAN: 66 mL/min/{1.73_m2} (ref >59)
GFR, EST NON AFRICAN AMERICAN: 57 mL/min/{1.73_m2} — AB (ref >59)
GLUCOSE: 129 mg/dL — AB (ref 65–99)
Globulin, Total: 1.9 g/dL (ref 1.5–4.5)
Potassium: 4.6 mmol/L (ref 3.5–5.2)
Sodium: 142 mmol/L (ref 134–144)
TOTAL PROTEIN: 6.2 g/dL (ref 6.0–8.5)

## 2017-11-29 DIAGNOSIS — B351 Tinea unguium: Secondary | ICD-10-CM | POA: Diagnosis not present

## 2017-11-29 DIAGNOSIS — L84 Corns and callosities: Secondary | ICD-10-CM | POA: Diagnosis not present

## 2017-11-29 DIAGNOSIS — E1151 Type 2 diabetes mellitus with diabetic peripheral angiopathy without gangrene: Secondary | ICD-10-CM | POA: Diagnosis not present

## 2018-02-09 ENCOUNTER — Encounter: Payer: Self-pay | Admitting: Family Medicine

## 2018-02-09 ENCOUNTER — Ambulatory Visit (INDEPENDENT_AMBULATORY_CARE_PROVIDER_SITE_OTHER): Payer: Medicare HMO | Admitting: Family Medicine

## 2018-02-09 VITALS — BP 151/68 | HR 100 | Temp 97.8°F | Ht 75.0 in | Wt 300.0 lb

## 2018-02-09 DIAGNOSIS — E1142 Type 2 diabetes mellitus with diabetic polyneuropathy: Secondary | ICD-10-CM

## 2018-02-09 DIAGNOSIS — Z794 Long term (current) use of insulin: Secondary | ICD-10-CM | POA: Diagnosis not present

## 2018-02-09 DIAGNOSIS — R030 Elevated blood-pressure reading, without diagnosis of hypertension: Secondary | ICD-10-CM

## 2018-02-09 LAB — BAYER DCA HB A1C WAIVED: HB A1C (BAYER DCA - WAIVED): 7.8 % — ABNORMAL HIGH (ref <7.0)

## 2018-02-09 NOTE — Patient Instructions (Signed)
Great to see you!  We will keep an eye on your blood pressure.   Come back to see Dr. Lajuana Ripple in 3 months

## 2018-02-09 NOTE — Progress Notes (Signed)
   HPI  Patient presents today for chronic medical conditions.  Type 2 diabetes Average fasting blood sugar is 160/170 No hypoglycemia Patient reports good medication compliance with Tresiba 46 units twice daily and regular insulin 10 units per meal.   PMH: Smoking status noted ROS: Per HPI  Objective: BP (!) 151/68   Pulse 100   Temp 97.8 F (36.6 C) (Oral)   Ht '6\' 3"'$  (1.905 m)   Wt 300 lb (136.1 kg)   BMI 37.50 kg/m  Gen: NAD, alert, cooperative with exam HEENT: NCAT CV: RRR, good S1/S2, no murmur Resp: CTABL, no wheezes, non-labored Ext: RLE prosthesis Neuro: Alert and oriented, No gross deficits  Assessment and plan:  #Type 2 diabetes Previously recently well controlled, I have previously encouraged him to see endocrinology and referred him, however he did not establish care. Good medication compliance A1c pending Return to clinic in 3 months  #Elevated blood pressure Elevated today, several recent blood pressures have not been elevated No treatment for now, monitor    Orders Placed This Encounter  Procedures  . Microalbumin / creatinine urine ratio  . Bayer DCA Hb A1c Waived  . [place], MD [facility] 02/09/2018, 2:27 PM

## 2018-02-10 LAB — MICROALBUMIN / CREATININE URINE RATIO
CREATININE, UR: 156.6 mg/dL
Microalb/Creat Ratio: 12.5 mg/g creat (ref 0.0–30.0)
Microalbumin, Urine: 19.6 ug/mL

## 2018-02-10 LAB — BMP8+EGFR
BUN/Creatinine Ratio: 18 (ref 10–24)
BUN: 22 mg/dL (ref 8–27)
CHLORIDE: 103 mmol/L (ref 96–106)
CO2: 22 mmol/L (ref 20–29)
CREATININE: 1.19 mg/dL (ref 0.76–1.27)
Calcium: 9.4 mg/dL (ref 8.6–10.2)
GFR calc non Af Amer: 63 mL/min/{1.73_m2} (ref >59)
GFR, EST AFRICAN AMERICAN: 73 mL/min/{1.73_m2} (ref >59)
GLUCOSE: 248 mg/dL — AB (ref 65–99)
POTASSIUM: 4.9 mmol/L (ref 3.5–5.2)
Sodium: 143 mmol/L (ref 134–144)

## 2018-02-11 ENCOUNTER — Encounter: Payer: Self-pay | Admitting: Family Medicine

## 2018-02-17 ENCOUNTER — Encounter: Payer: Self-pay | Admitting: *Deleted

## 2018-03-08 DIAGNOSIS — R69 Illness, unspecified: Secondary | ICD-10-CM | POA: Diagnosis not present

## 2018-03-17 ENCOUNTER — Other Ambulatory Visit: Payer: Self-pay | Admitting: *Deleted

## 2018-03-17 MED ORDER — DULAGLUTIDE 1.5 MG/0.5ML ~~LOC~~ SOAJ: 1.5000 mg | SUBCUTANEOUS | 0 refills | Status: DC

## 2018-04-07 ENCOUNTER — Ambulatory Visit (INDEPENDENT_AMBULATORY_CARE_PROVIDER_SITE_OTHER): Payer: Medicare HMO | Admitting: Family Medicine

## 2018-04-07 ENCOUNTER — Encounter: Payer: Self-pay | Admitting: Family Medicine

## 2018-04-07 VITALS — BP 135/74 | HR 86 | Temp 97.3°F | Ht 75.0 in | Wt 299.0 lb

## 2018-04-07 DIAGNOSIS — E1142 Type 2 diabetes mellitus with diabetic polyneuropathy: Secondary | ICD-10-CM

## 2018-04-07 DIAGNOSIS — Z794 Long term (current) use of insulin: Secondary | ICD-10-CM | POA: Diagnosis not present

## 2018-04-07 LAB — BAYER DCA HB A1C WAIVED: HB A1C (BAYER DCA - WAIVED): 7.8 % — ABNORMAL HIGH (ref <7.0)

## 2018-04-07 MED ORDER — "INSULIN SYRINGE-NEEDLE U-100 30G X 5/16"" 0.3 ML MISC": 1.0000 | Freq: Three times a day (TID) | 11 refills | Status: DC

## 2018-04-07 NOTE — Progress Notes (Signed)
Subjective: CC: DM2 PCP: Janora Norlander, DO IFO:Rick Lane is a 67 y.o. male presenting to clinic today for:  1. Type 2 Diabetes:  Patient reports blood sugars have been running around 150s to 200s in the morning.  Most recent fasting blood sugars 160.  Taking medication(s): 46 units of Tresiba twice daily and 10 units of Novolin q. meal., Side effects: None. Last A1c: 7.8 in July. Nephropathy screen indicated?:  Was performed in July and was negative Last flu, zoster and/or pneumovax: Needs influenza shot and PNA shot  ROS: No chest pain, shortness of breath.  He has a history of right BKA.   2.  Upcoming dental extraction Patient reports that he was asked by his dentist to obtain a letter stating that he was able to have dental extractions.  He notes that these are planned for outpatient extraction he is not plans to undergo any sedation for the dental extraction.  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  He notes he tolerated procedure without difficulty.  He is not on any anticoagulation or any medications for osteoporosis.  ROS: Per HPI  Allergies  Allergen Reactions  . Bactrim [Sulfamethoxazole-Trimethoprim] Itching  . Gentamicin Other (See Comments)    Unknown   Past Medical History:  Diagnosis Date  . Asthma    ass a child  . Cataract   . Depression   . Diabetes mellitus without complication ([place])    Type II  . Hyperlipidemia   . Neuropathy     Current Outpatient Medications:  .  atorvastatin (LIPITOR) 20 MG tablet, Take 1 tablet (20 mg total) daily by mouth., Disp: 90 tablet, Rfl: 3 .  Insulin Degludec (TRESIBA FLEXTOUCH) 200 UNIT/ML SOPN, Inject 46 Units into the skin 2 (two) times daily., Disp: 18 mL, Rfl: 11 .  insulin regular (NOVOLIN R,HUMULIN R) 100 units/mL injection, Inject 0.1 mLs (10 Units total) into the skin 3 (three) times daily before meals., Disp: 10 mL, Rfl: 11 .  Insulin  Syringe-Needle U-100 (RELION INSULIN SYR 0.3CC/30G) 30G X 5/16" 0.3 ML MISC, 1 Syringe by Does not apply route 3 (three) times daily., Disp: 100 each, Rfl: 11 .  Multiple Vitamin (MULTIVITAMIN) capsule, Take 1 capsule by mouth daily., Disp: , Rfl:  Social History   Socioeconomic History  . Marital status: Married    Spouse name: Not on file  . Number of children: 0  . Years of education: 55  . Highest education level: Not on file  Occupational History  . Occupation: Scientist, research (physical sciences)    Comment: [employer]  . Financial resource strain: Not on file  . Food insecurity:    Worry: Not on file    Inability: Not on file  . Transportation needs:    Medical: Not on file    Non-medical: Not on file  Tobacco Use  . Smoking status: Never Smoker  . Smokeless tobacco: Never Used  Substance and Sexual Activity  . Alcohol use: No  . Drug use: No  . Sexual activity: Not on file  Lifestyle  . Physical activity:    Days per week: Not on file    Minutes per session: Not on file  . Stress: Not on file  Relationships  . Social connections:    Talks on phone: Not on file    Gets together: Not on file    Attends religious service: Not on file    Active member  of club or organization: Not on file    Attends meetings of clubs or organizations: Not on file    Relationship status: Not on file  . Intimate partner violence:    Fear of current or ex partner: Not on file    Emotionally abused: Not on file    Physically abused: Not on file    Forced sexual activity: Not on file  Other Topics Concern  . Not on file  Social History Narrative  . Not on file   Family History  Problem Relation Age of Onset  . Heart disease Father   . Diabetes Sister   . Diabetes Brother   . Heart attack Paternal Grandfather     Objective: Office vital signs reviewed. BP 135/74   Pulse 86   Temp (!) 97.3 F (36.3 C) (Oral)   Ht 6\' 3"  (1.905 m)   Wt 299 lb (135.6 kg)   BMI 37.37 kg/m   Physical  Examination:  General: Awake, alert, well nourished, No acute distress HEENT: Normal; several broken teeth and teeth missing.  He has 1 visible, complete tooth on the left lower aspect of the mouth. Cardio: regular rate and rhythm, S1S2 heard, no murmurs appreciated Pulm: clear to auscultation bilaterally, no wheezes, rhonchi or rales; normal work of breathing on room air Extremities: Right lower extremity with BKA and prosthetic in place.  Assessment/ Plan: 67 y.o. male   1. Type 2 diabetes mellitus with diabetic polyneuropathy, with long-term current use of insulin (HCC) Check A1c today.  His last A1c was technically considered controlled for age and multiple comorbidities.  Ideally, he would do better if he had an A1c closer to 7.  He is not currently on any blood thinners or medications which would interfere with bone healing.  Because he is not undergoing sedation for the procedure, I think that he will tolerate dental extraction without difficulty.  We did discuss that uncontrolled blood sugars can impair his ability to heal and this may be something to consider.  A note was provided saying that he could go through with the procedure, as he seems to understand the risks and benefits of dental extraction. - Bayer DCA Hb A1c Waived   Orders Placed This Encounter  Procedures  . Bayer [facility] Hb A1c Waived     [place], [place] [phone]

## 2018-04-19 DIAGNOSIS — R69 Illness, unspecified: Secondary | ICD-10-CM | POA: Diagnosis not present

## 2018-05-15 ENCOUNTER — Ambulatory Visit: Payer: Medicare HMO | Admitting: Family Medicine

## 2018-05-29 ENCOUNTER — Encounter: Payer: Self-pay | Admitting: Family Medicine

## 2018-05-29 ENCOUNTER — Encounter (INDEPENDENT_AMBULATORY_CARE_PROVIDER_SITE_OTHER): Payer: Self-pay | Admitting: *Deleted

## 2018-05-29 ENCOUNTER — Ambulatory Visit (INDEPENDENT_AMBULATORY_CARE_PROVIDER_SITE_OTHER): Payer: Medicare HMO | Admitting: Family Medicine

## 2018-05-29 VITALS — BP 117/69 | HR 86 | Temp 97.1°F | Ht 75.0 in | Wt 293.0 lb

## 2018-05-29 DIAGNOSIS — Z1211 Encounter for screening for malignant neoplasm of colon: Secondary | ICD-10-CM

## 2018-05-29 DIAGNOSIS — R600 Localized edema: Secondary | ICD-10-CM

## 2018-05-29 DIAGNOSIS — L570 Actinic keratosis: Secondary | ICD-10-CM

## 2018-05-29 DIAGNOSIS — Z2821 Immunization not carried out because of patient refusal: Secondary | ICD-10-CM | POA: Diagnosis not present

## 2018-05-29 NOTE — Progress Notes (Signed)
Subjective: CC: edema PCP: Janora Norlander, DO OYD:XAJOINO Rehfeldt is a 67 y.o. male presenting to clinic today for:  1. LLE edema Patient reports a couple month history of left lower extremity edema.  He reports that he often will sit at home with his left lower leg hanging down.  He does not get up much and move around.  He intakes moderate amounts of salt, citing that he was told by his previous providers to never go on a low-salt diet so as not to precipitate hypotensive episodes.  He has not used any compression hose.  He has not tried elevating his left lower extremity.  He has history of amputation of the right lower extremity.  No shortness of breath or orthopnea.  2.  Colon cancer screening Patient reports last colonoscopy was greater than 11 years ago and performed in [place].  He denies any hematochezia, melena, nausea, vomiting, abdominal pain, unplanned weight loss.  He reports last colonoscopy was normal.  No family history in first-degree relatives of colon cancer.  He does feel like he has a maternal grandmother that had colon cancer.  3.  Skin lesions Patient reports multiple skin lesions on his scalp that he is worried about.  He states he has had some lesions on his back removed in the past by a dermatologist at the skin surgery center in [place].  He has not seen a dermatologist in a while now but would like to see if the skin lesions on his scalp can be frozen off.  He is interested in reestablishing with a dermatologist.  Allergies  Allergen Reactions  . Bactrim [Sulfamethoxazole-Trimethoprim] Itching  . Gentamicin Other (See Comments)    Unknown   Past Medical History:  Diagnosis Date  . Asthma    ass a child  . Cataract   . Depression   . Diabetes mellitus without complication ([place])    Type II  . Hyperlipidemia   . Neuropathy     Current Outpatient Medications:  .  atorvastatin (LIPITOR) 20 MG tablet, Take 1 tablet (20 mg total) daily by mouth.,  Disp: 90 tablet, Rfl: 3 .  Insulin Degludec (TRESIBA FLEXTOUCH) 200 UNIT/ML SOPN, Inject 46 Units into the skin 2 (two) times daily., Disp: 18 mL, Rfl: 11 .  insulin regular (NOVOLIN R,HUMULIN R) 100 units/mL injection, Inject 0.1 mLs (10 Units total) into the skin 3 (three) times daily before meals., Disp: 10 mL, Rfl: 11 .  Insulin Syringe-Needle U-100 (RELION INSULIN SYR 0.3CC/30G) 30G X 5/16" 0.3 ML MISC, 1 Syringe by Does not apply route 3 (three) times daily. Dx E11.42, Disp: 100 each, Rfl: 11 .  Multiple Vitamin (MULTIVITAMIN) capsule, Take 1 capsule by mouth daily., Disp: , Rfl:  Social History   Socioeconomic History  . Marital status: Married    Spouse name: Not on file  . Number of children: 0  . Years of education: 31  . Highest education level: Not on file  Occupational History  . Occupation: Scientist, research (physical sciences)    Comment: [employer]  . Financial resource strain: Not on file  . Food insecurity:    Worry: Not on file    Inability: Not on file  . Transportation needs:    Medical: Not on file    Non-medical: Not on file  Tobacco Use  . Smoking status: Never Smoker  . Smokeless tobacco: Never Used  Substance and Sexual Activity  . Alcohol use: No  . Drug use: No  .  Sexual activity: Not on file  Lifestyle  . Physical activity:    Days per week: Not on file    Minutes per session: Not on file  . Stress: Not on file  Relationships  . Social connections:    Talks on phone: Not on file    Gets together: Not on file    Attends religious service: Not on file    Active member of club or organization: Not on file    Attends meetings of clubs or organizations: Not on file    Relationship status: Not on file  . Intimate partner violence:    Fear of current or ex partner: Not on file    Emotionally abused: Not on file    Physically abused: Not on file    Forced sexual activity: Not on file  Other Topics Concern  . Not on file  Social History Narrative  . Not on  file   Family History  Problem Relation Age of Onset  . Heart disease Father   . Diabetes Sister   . Diabetes Brother   . Heart attack Paternal Grandfather     Objective: Office vital signs reviewed. BP 117/69   Pulse 86   Temp (!) 97.1 F (36.2 C)   Ht 6\' 3"  (1.905 m)   Wt 293 lb (132.9 kg)   BMI 36.62 kg/m   Physical Examination:  General: Awake, alert, well nourished, No acute distress Neck: no JVD Cardio: regular rate and rhythm, S1S2 heard, no murmurs appreciated Pulm: clear to auscultation bilaterally, no wheezes, rhonchi or rales; normal work of breathing on room air Extremities: Right lower extremity with BKA and prosthetic in place.  Left lower extremity with 1+ pitting edema to the midshin. Skin: Patient has 4 actinic keratoses appreciated along the scalp.  2 of which are along the anterior aspect of the scalp and 2 of which are along the posterior aspect of the scalp.  No evidence of bleeding or pigmentation.  He has multiple seborrheic keratoses along the back and upper extremities.  He also has several pigmented lesions along the back.  Cryotherapy Procedure:  Risks and benefits of procedure were reviewed with the patient.  Written consent obtained and scanned into the chart.  Lesion #1 of concern was identified and located on the anterior right scalp.  Liquid nitrogen was applied to area of concern and extending out 2 millimeters beyond the border of the lesion.  Treated area was allowed to come back to room temperature before treating it a second time.  Patient tolerated procedure well and there were no immediate complications.    Risks and benefits of procedure were reviewed with the patient.  Written consent obtained and scanned into the chart.  Lesion #2 of concern was identified and located on the anterior left scalp.  Liquid nitrogen was applied to area of concern and extending out 2 millimeters beyond the border of the lesion.  Treated area was allowed to come  back to room temperature before treating it a second time.  Patient tolerated procedure well and there were no immediate complications.   Risks and benefits of procedure were reviewed with the patient.  Written consent obtained and scanned into the chart.  Lesion #3 of concern was identified and located on the posterior right scalp.  Liquid nitrogen was applied to area of concern and extending out 2 millimeters beyond the border of the lesion.  Treated area was allowed to come back to room temperature before treating it  a second time.  Patient tolerated procedure well and there were no immediate complications.   Risks and benefits of procedure were reviewed with the patient.  Written consent obtained and scanned into the chart.  Lesion #4 of concern was identified and located on the posterior left scalp.  Liquid nitrogen was applied to area of concern and extending out 2 millimeters beyond the border of the lesion.  Treated area was allowed to come back to room temperature before treating it a second time.  Patient tolerated procedure well and there were no immediate complications.  Home care instructions were reviewed with the patient and a handout was provided.  Assessment/ Plan: 67 y.o. male   1. Lower extremity edema Likely dependent edema.  Nothing on exam to suggest fluid overload.  We discussed elevation of the left lower extremity and I have given him a written prescription for compression stockings.  He will follow-up as needed for this issue. - Compression stockings  2. Actinic keratoses Treated with cryotherapy x4.  See above procedures.  Home care instructions were reviewed.  Follow-up PRN.  Referral to dermatology placed for other pigmented skin lesions. - Ambulatory referral to Dermatology  3. Colon cancer screening Referral to gastroenterology placed. - Ambulatory referral to Gastroenterology  4. Influenza vaccine refused Counseling provided.   Orders Placed This Encounter    Procedures  . Compression stockings    UAD for edema. Dx R60.0  . Ambulatory referral to Gastroenterology    Referral Priority:   Routine    Referral Type:   Consultation    Referral Reason:   Specialty Services Required    Number of Visits Requested:   1  . Ambulatory referral to Dermatology    Referral Priority:   Routine    Referral Type:   Consultation    Referral Reason:   Specialty Services Required    Requested Specialty:   Dermatology    Number of Visits Requested:   [place], [place] [phone]

## 2018-05-29 NOTE — Patient Instructions (Addendum)
Elevate your leg to reduce swelling.  I have given you a prescription for compression hose.  Please make sure to be fasting for your next appointment.  We will recheck your cholesterol and blood sugar at that appointment.  Have your eye doctor send me your report.   Cryosurgery for Skin Conditions, Care After This sheet gives you information about how to care for yourself after your procedure. Your health care provider may also give you more specific instructions. If you have problems or questions, contact your health care provider. What can I expect after the procedure? After your procedure, it is common to have redness, swelling, and a blister that forms over the treated area. The blister may contain a small amount of blood. After about 2 weeks, the blister will break on its own, leaving a scab. Then the treated area will heal. After healing, there is usually little or no scarring. Follow these instructions at home: Caring for the treated area  Follow instructions from your health care provider about how to take care of the treated area. Make sure you: ? Keep the area covered with a bandage (dressing) until it heals, or for as long as told by your health care provider. ? Wash your hands with soap and water before you change your dressing. If soap and water are not available, use hand sanitizer. ? Change your dressing as told by your health care provider. ? Keep the dressing and the treated area clean and dry. If the dressing gets wet, change it right away. ? Clean the treated area with soap and water.  Check the treated area every day for signs of infection. Check for: ? More redness, swelling, or pain. ? More fluid or blood. ? Warmth. ? Pus or a bad smell. General instructions  Do not pick at your blister or try to break it open. This can cause infection and scarring.  Do not apply any medicine, cream, or lotion to the treated area unless directed by your health care provider.  Take  over-the-counter and prescription medicines only as told by your health care provider.  Keep all follow-up visits as told by your health care provider. This is important. Contact a health care provider if:  You have more redness, swelling, or pain around the treated area.  You have more fluid or blood coming from the treated area.  The treated area feels warm to the touch.  You have pus or a bad smell coming from the treated area.  Your blister becomes large and painful. Get help right away if:  You have a fever and have redness spreading from the treated area. Summary  The treated area will become red and swollen shortly after the procedure.  You should keep the treated area and your dressing clean and dry.  Check the treated area every day for signs of infection, such as fluid, pus, warmth, or having more redness, swelling, or pain.  Do not pick at your blister or try to break it open. This information is not intended to replace advice given to you by your health care provider. Make sure you discuss any questions you have with your health care provider. Document Released: 01/29/2005 Document Revised: 05/31/2016 Document Reviewed: 05/31/2016 Elsevier Interactive Patient Education  2017 [employer].

## 2018-06-23 DIAGNOSIS — H25811 Combined forms of age-related cataract, right eye: Secondary | ICD-10-CM | POA: Diagnosis not present

## 2018-06-23 DIAGNOSIS — Z961 Presence of intraocular lens: Secondary | ICD-10-CM | POA: Diagnosis not present

## 2018-06-23 DIAGNOSIS — E113292 Type 2 diabetes mellitus with mild nonproliferative diabetic retinopathy without macular edema, left eye: Secondary | ICD-10-CM | POA: Diagnosis not present

## 2018-06-23 DIAGNOSIS — H31002 Unspecified chorioretinal scars, left eye: Secondary | ICD-10-CM | POA: Diagnosis not present

## 2018-06-23 LAB — HM DIABETES EYE EXAM

## 2018-06-27 ENCOUNTER — Encounter: Payer: Self-pay | Admitting: Family Medicine

## 2018-07-10 ENCOUNTER — Ambulatory Visit (INDEPENDENT_AMBULATORY_CARE_PROVIDER_SITE_OTHER): Payer: Medicare HMO | Admitting: Family Medicine

## 2018-07-10 VITALS — BP 137/77 | HR 89 | Temp 98.4°F | Ht 75.0 in | Wt 291.0 lb

## 2018-07-10 DIAGNOSIS — E11319 Type 2 diabetes mellitus with unspecified diabetic retinopathy without macular edema: Secondary | ICD-10-CM | POA: Diagnosis not present

## 2018-07-10 DIAGNOSIS — Z794 Long term (current) use of insulin: Secondary | ICD-10-CM | POA: Diagnosis not present

## 2018-07-10 DIAGNOSIS — E1169 Type 2 diabetes mellitus with other specified complication: Secondary | ICD-10-CM | POA: Diagnosis not present

## 2018-07-10 DIAGNOSIS — E785 Hyperlipidemia, unspecified: Secondary | ICD-10-CM

## 2018-07-10 LAB — BAYER DCA HB A1C WAIVED: HB A1C: 8 % — AB (ref <7.0)

## 2018-07-10 MED ORDER — ATORVASTATIN CALCIUM 20 MG PO TABS: 20.0000 mg | ORAL_TABLET | Freq: Every day | ORAL | 3 refills | Status: DC

## 2018-07-10 MED ORDER — INSULIN DEGLUDEC 200 UNIT/ML ~~LOC~~ SOPN: 48.0000 [IU] | PEN_INJECTOR | Freq: Two times a day (BID) | SUBCUTANEOUS | 11 refills | Status: DC

## 2018-07-10 NOTE — Patient Instructions (Addendum)
Blood sugar is not controlled.  Your A1c has gone up to 8.0 today.   As we discussed:  For fasting blood sugars greater than 140, increase your Tresiba by 2 units every 3 days.  I favor increasing the morning dose.  Then increasing the evening dose if needed.  Record your blood sugars daily and bring in to next visit (or bring monitor in)  We will recheck in 3 months.  In the interim, I want you to consider getting pneumonia vaccine and flu vaccines.  In persons whose immune system is compromised (diabetics, persons w/ lung disease, undergoing chemo etc) these can be life saving vaccines.  We will perform your diabetic foot exam at next visit.

## 2018-07-10 NOTE — Progress Notes (Signed)
Subjective: CC: DM2 PCP: Janora Norlander, DO WUX:[id number] Kowalke is a 67 y.o. male presenting to clinic today for:  1. Type 2 Diabetes w/ hyperlipidemia Patient reports blood sugars have been "good the last few mornings".  He reports blood sugars anywhere from 81-1 13 over the last 3 days.  Prior to that, blood sugars running 140s 150s fasting.  He notes that over the last couple of days he is reduced to 2 meals daily and this is why the blood sugars have improved.  The highest blood sugar that he has seen was just over 200 about a month and a half ago.  He is injecting 46 units of Tresiba twice daily and 10 units of Novolin R q. meal.,  He is also taking Lipitor 20 mg daily.  Side effects: None. Last A1c: 7.8 in September. Nephropathy screen indicated?:  Was performed in July 2019 and was negative Last flu, zoster and/or pneumovax: UTD, needs TDap at some point  ROS: No chest pain, shortness of breath.  He has a history of right BKA.  He keeps an eye on the left foot fairly closely as does his wife.  Denies any ulceration or skin breakdown.  ROS: Per HPI  Allergies  Allergen Reactions  . Bactrim [Sulfamethoxazole-Trimethoprim] Itching  . Gentamicin Other (See Comments)    Unknown   Past Medical History:  Diagnosis Date  . Asthma    ass a child  . Cataract   . Depression   . Diabetes mellitus without complication ([place])    Type II  . Hyperlipidemia   . Neuropathy     Current Outpatient Medications:  .  atorvastatin (LIPITOR) 20 MG tablet, Take 1 tablet (20 mg total) daily by mouth., Disp: 90 tablet, Rfl: 3 .  Insulin Degludec (TRESIBA FLEXTOUCH) 200 UNIT/ML SOPN, Inject 46 Units into the skin 2 (two) times daily., Disp: 18 mL, Rfl: 11 .  insulin regular (NOVOLIN R,HUMULIN R) 100 units/mL injection, Inject 0.1 mLs (10 Units total) into the skin 3 (three) times daily before meals., Disp: 10 mL, Rfl: 11 .  Insulin Syringe-Needle U-100 (RELION INSULIN SYR 0.3CC/30G) 30G X 5/16"  0.3 ML MISC, 1 Syringe by Does not apply route 3 (three) times daily. Dx E11.42, Disp: 100 each, Rfl: 11 .  Multiple Vitamin (MULTIVITAMIN) capsule, Take 1 capsule by mouth daily., Disp: , Rfl:  Social History   Socioeconomic History  . Marital status: Married    Spouse name: Not on file  . Number of children: 0  . Years of education: 52  . Highest education level: Not on file  Occupational History  . Occupation: Scientist, research (physical sciences)    Comment: [employer]  . Financial resource strain: Not on file  . Food insecurity:    Worry: Not on file    Inability: Not on file  . Transportation needs:    Medical: Not on file    Non-medical: Not on file  Tobacco Use  . Smoking status: Never Smoker  . Smokeless tobacco: Never Used  Substance and Sexual Activity  . Alcohol use: No  . Drug use: No  . Sexual activity: Not on file  Lifestyle  . Physical activity:    Days per week: Not on file    Minutes per session: Not on file  . Stress: Not on file  Relationships  . Social connections:    Talks on phone: Not on file    Gets together: Not on file    Attends religious  service: Not on file    Active member of club or organization: Not on file    Attends meetings of clubs or organizations: Not on file    Relationship status: Not on file  . Intimate partner violence:    Fear of current or ex partner: Not on file    Emotionally abused: Not on file    Physically abused: Not on file    Forced sexual activity: Not on file  Other Topics Concern  . Not on file  Social History Narrative  . Not on file   Family History  Problem Relation Age of Onset  . Heart disease Father   . Diabetes Sister   . Diabetes Brother   . Heart attack Paternal Grandfather     Objective: Office vital signs reviewed. BP 137/77   Pulse 89   Temp 98.4 F (36.9 C) (Oral)   Ht 6\' 3"  (1.905 m)   Wt 291 lb (132 kg)   BMI 36.37 kg/m   Physical Examination:  General: Awake, alert, well nourished, No  acute distress HEENT: Normal; sclera white, MMM Cardio: regular rate and rhythm, S1S2 heard, no murmurs appreciated Pulm: clear to auscultation bilaterally, no wheezes, rhonchi or rales; normal work of breathing on room air Extremities: Right lower extremity with BKA and prosthetic in place. LLE without edema.  Assessment/ Plan: 67 y.o. male   1. Type 2 diabetes mellitus with retinopathy without macular edema, with long-term current use of insulin, unspecified laterality, unspecified retinopathy severity ([place]) His blood sugar has increased since last visit.  Today's A1c was 8.0.  We discussed the need for tighter control, particularly given history of amputation.  I have advised him to increase his Tresiba to 48 units every morning and continue 46 units every afternoon.  Unsure why he is on divided doses given that this is a 24-hour medication but because he is used it we are continuing split dosing.  We discussed that he may increase by 2 units every 3 days for fasting blood sugars greater than 140.  His AVS was reviewed.  He will follow-up in 3 months or sooner if needed.   - Bayer DCA Hb A1c Waived  2. Hyperlipidemia associated with type 2 diabetes mellitus ([place]) Continue Statin  Orders Placed This Encounter  Procedures  . Bayer DCA Hb A1c Waived   Meds ordered this encounter  Medications  . Insulin Degludec (TRESIBA FLEXTOUCH) 200 UNIT/ML SOPN    Sig: Inject 48 Units into the skin 2 (two) times daily.    Dispense:  18 mL    Refill:  11    QS for 1 month, 1.5 box is nearest amount so round up to 2 boxes if possible, ( 5 pens needed to cover month)  . atorvastatin (LIPITOR) 20 MG tablet    Sig: Take 1 tablet (20 mg total) by mouth daily.    Dispense:  90 tablet    Refill:  [place], [place] [phone]

## 2018-07-31 IMAGING — NM NM MISC PROCEDURE
9 series · 54 of 54 positions shown · non-contrast
Comparison: none

[Series 1: rest sax · 6.4mm · 6.40mm/px · 6 of 25 frames shown]
[frame 3/25]
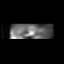
[frame 7/25]
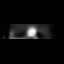
[frame 11/25]
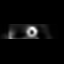
[frame 15/25]
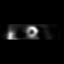
[frame 19/25]
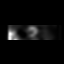
[frame 23/25]
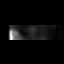

[Series 1: wbr_r-proj_st wbr rest · 6.40mm/px · 6 of 64 frames shown]
[frame 6/64]
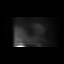
[frame 16/64]
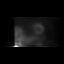
[frame 27/64]
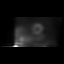
[frame 38/64]
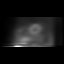
[frame 48/64]
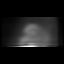
[frame 59/64]
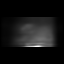

[Series 1: wbr rest · 6.40mm/px · 6 of 64 frames shown]
[frame 6/64]
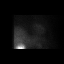
[frame 16/64]
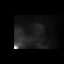
[frame 27/64]
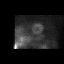
[frame 38/64]
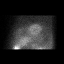
[frame 48/64]
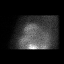
[frame 59/64]
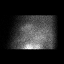

[Series 2: stress sax · 6.4mm · 6.40mm/px · 6 of 27 frames shown]
[frame 3/27]
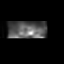
[frame 7/27]
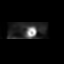
[frame 12/27]
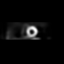
[frame 16/27]
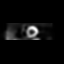
[frame 21/27]
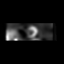
[frame 25/27]
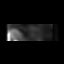

[Series 2: stress sax gs · 6.4mm · 6.40mm/px · 6 of 216 frames shown]
[frame 19/216]
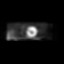
[frame 55/216]
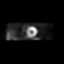
[frame 91/216]
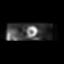
[frame 127/216]
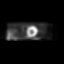
[frame 163/216]
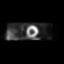
[frame 199/216]
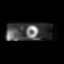

[Series 2: wbr_s-proj_st wbr stress-gsp · 6.40mm/px · 6 of 512 frames shown]
[frame 43/512]
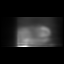
[frame 128/512]
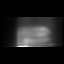
[frame 214/512]
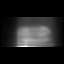
[frame 299/512]
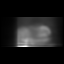
[frame 384/512]
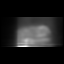
[frame 470/512]
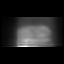

[Series 2: wbr stress-gsp · 6.40mm/px · 6 of 497 frames shown]
[frame 42/497  full-range]
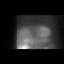
[frame 124/497  full-range]
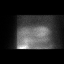
[frame 207/497  full-range]
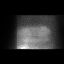
[frame 290/497  full-range]
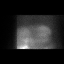
[frame 373/497  full-range]
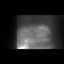
[frame 456/497  full-range]
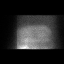

[Series 3: wbr_s-proj_st wbr stress-sum-em · 6.40mm/px · 6 of 64 frames shown]
[frame 6/64]
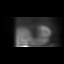
[frame 16/64]
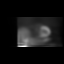
[frame 27/64]
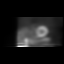
[frame 38/64]
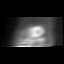
[frame 48/64]
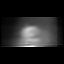
[frame 59/64]
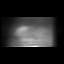

[Series 3: wbr stress-sum-em · 6.40mm/px · 6 of 64 frames shown]
[frame 6/64]
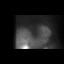
[frame 16/64]
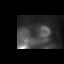
[frame 27/64]
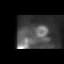
[frame 38/64]
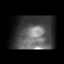
[frame 48/64]
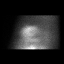
[frame 59/64]
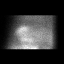

[54 of 54 positions shown; findings below may reference images not displayed]

Canned report from images found in remote index.

Refer to host system for actual result text.

## 2018-09-07 DIAGNOSIS — Z961 Presence of intraocular lens: Secondary | ICD-10-CM | POA: Diagnosis not present

## 2018-09-07 DIAGNOSIS — H2521 Age-related cataract, morgagnian type, right eye: Secondary | ICD-10-CM | POA: Diagnosis not present

## 2018-09-19 NOTE — Patient Instructions (Signed)
Your procedure is scheduled on: 10/02/2018  Report to [facility] at  32  AM.  Call this number if you have problems the morning of surgery: [phone]   Do not eat food or drink liquids :After Midnight.      Take these medicines the morning of surgery with A SIP OF WATER: None. Take 1/2 of your night time insulin the night before your surgery. DO NOT take any Insulin the day of your procedure.   Do not wear jewelry, make-up or nail polish.  Do not wear lotions, powders, or perfumes. You may wear deodorant.  Do not shave 48 hours prior to surgery.  Do not bring valuables to the hospital.  Contacts, dentures or bridgework may not be worn into surgery.  Leave suitcase in the car. After surgery it may be brought to your room.  For patients admitted to the hospital, checkout time is 11:00 AM the day of discharge.   Patients discharged the day of surgery will not be allowed to drive home.  :     Please read over the following fact sheets that you were given: Coughing and Deep Breathing, Surgical Site Infection Prevention, Anesthesia Post-op Instructions and Care and Recovery After Surgery    Cataract A cataract is a clouding of the lens of the eye. When a lens becomes cloudy, vision is reduced based on the degree and nature of the clouding. Many cataracts reduce vision to some degree. Some cataracts make people more near-sighted as they develop. Other cataracts increase glare. Cataracts that are ignored and become worse can sometimes look white. The white color can be seen through the pupil. CAUSES   Aging. However, cataracts may occur at any age, even in newborns.   Certain drugs.   Trauma to the eye.   Certain diseases such as diabetes.   Specific eye diseases such as chronic inflammation inside the eye or a sudden attack of a rare form of glaucoma.   Inherited or acquired medical problems.  SYMPTOMS   Gradual, progressive drop in vision in the affected eye.   Severe, rapid visual  loss. This most often happens when trauma is the cause.  DIAGNOSIS  To detect a cataract, an eye doctor examines the lens. Cataracts are best diagnosed with an exam of the eyes with the pupils enlarged (dilated) by drops.  TREATMENT  For an early cataract, vision may improve by using different eyeglasses or stronger lighting. If that does not help your vision, surgery is the only effective treatment. A cataract needs to be surgically removed when vision loss interferes with your everyday activities, such as driving, reading, or watching TV. A cataract may also have to be removed if it prevents examination or treatment of another eye problem. Surgery removes the cloudy lens and usually replaces it with a substitute lens (intraocular lens, IOL).  At a time when both you and your doctor agree, the cataract will be surgically removed. If you have cataracts in both eyes, only one is usually removed at a time. This allows the operated eye to heal and be out of danger from any possible problems after surgery (such as infection or poor wound healing). In rare cases, a cataract may be doing damage to your eye. In these cases, your caregiver may advise surgical removal right away. The vast majority of people who have cataract surgery have better vision afterward. HOME CARE INSTRUCTIONS  If you are not planning surgery, you may be asked to do the following:  Use different eyeglasses.   Use stronger or brighter lighting.   Ask your eye doctor about reducing your medicine dose or changing medicines if it is thought that a medicine caused your cataract. Changing medicines does not make the cataract go away on its own.   Become familiar with your surroundings. Poor vision can lead to injury. Avoid bumping into things on the affected side. You are at a higher risk for tripping or falling.   Exercise extreme care when driving or operating machinery.   Wear sunglasses if you are sensitive to bright light or  experiencing problems with glare.  SEEK IMMEDIATE MEDICAL CARE IF:   You have a worsening or sudden vision loss.   You notice redness, swelling, or increasing pain in the eye.   You have a fever.  Document Released: 07/12/2005 Document Revised: 07/01/2011 Document Reviewed: 03/05/2011 [facility] Patient Information 2012 [place].PATIENT INSTRUCTIONS POST-ANESTHESIA  IMMEDIATELY FOLLOWING SURGERY:  Do not drive or operate machinery for the first twenty four hours after surgery.  Do not make any important decisions for twenty four hours after surgery or while taking narcotic pain medications or sedatives.  If you develop intractable nausea and vomiting or a severe headache please notify your doctor immediately.  FOLLOW-UP:  Please make an appointment with your surgeon as instructed. You do not need to follow up with anesthesia unless specifically instructed to do so.  WOUND CARE INSTRUCTIONS (if applicable):  Keep a dry clean dressing on the anesthesia/puncture wound site if there is drainage.  Once the wound has quit draining you may leave it open to air.  Generally you should leave the bandage intact for twenty four hours unless there is drainage.  If the epidural site drains for more than 36-48 hours please call the anesthesia department.  QUESTIONS?:  Please feel free to call your physician or the hospital operator if you have any questions, and they will be happy to assist you.

## 2018-09-25 ENCOUNTER — Other Ambulatory Visit: Payer: Self-pay

## 2018-09-25 ENCOUNTER — Encounter (HOSPITAL_COMMUNITY): Payer: Self-pay

## 2018-09-25 ENCOUNTER — Encounter (HOSPITAL_COMMUNITY)
Admission: RE | Admit: 2018-09-25 | Discharge: 2018-09-25 | Disposition: A | Discharge status: Home / Self Care | Payer: Medicare HMO | Source: Ambulatory Visit | Attending: Ophthalmology | Admitting: Ophthalmology

## 2018-09-25 DIAGNOSIS — H2521 Age-related cataract, morgagnian type, right eye: Secondary | ICD-10-CM | POA: Diagnosis not present

## 2018-09-25 DIAGNOSIS — Z01812 Encounter for preprocedural laboratory examination: Secondary | ICD-10-CM | POA: Diagnosis not present

## 2018-09-25 HISTORY — DX: Sleep apnea, unspecified: G47.30

## 2018-09-25 LAB — BASIC METABOLIC PANEL
Anion gap: 8 (ref 5–15)
BUN: 22 mg/dL (ref 8–23)
CO2: 24 mmol/L (ref 22–32)
Calcium: 9.5 mg/dL (ref 8.9–10.3)
Chloride: 110 mmol/L (ref 98–111)
Creatinine, Ser: 1.16 mg/dL (ref 0.61–1.24)
GFR calc Af Amer: >60 mL/min (ref >60)
GFR calc non Af Amer: >60 mL/min (ref >60)
Glucose, Bld: 123 mg/dL — ABNORMAL HIGH (ref 70–99)
Potassium: 4 mmol/L (ref 3.5–5.1)
Sodium: 142 mmol/L (ref 135–145)

## 2018-09-26 LAB — HEMOGLOBIN A1C
Hgb A1c MFr Bld: 7.6 % — ABNORMAL HIGH (ref 4.8–5.6)
MEAN PLASMA GLUCOSE: 171.42 mg/dL

## 2018-09-27 NOTE — Pre-Procedure Instructions (Signed)
HgbA1C routed to PCP. 

## 2018-10-02 ENCOUNTER — Ambulatory Visit (HOSPITAL_COMMUNITY): Payer: Medicare HMO | Admitting: Anesthesiology

## 2018-10-02 ENCOUNTER — Encounter (HOSPITAL_COMMUNITY): Payer: Self-pay

## 2018-10-02 ENCOUNTER — Ambulatory Visit (HOSPITAL_COMMUNITY)
Admission: RE | Admit: 2018-10-02 | Discharge: 2018-10-02 | Disposition: A | Discharge status: Home / Self Care | Payer: Medicare HMO | Attending: Ophthalmology | Admitting: Ophthalmology

## 2018-10-02 ENCOUNTER — Encounter (HOSPITAL_COMMUNITY)
Admission: RE | Disposition: A | Discharge status: Home / Self Care | Payer: Self-pay | Source: Home / Self Care | Attending: Ophthalmology

## 2018-10-02 DIAGNOSIS — E7800 Pure hypercholesterolemia, unspecified: Secondary | ICD-10-CM | POA: Diagnosis not present

## 2018-10-02 DIAGNOSIS — H2521 Age-related cataract, morgagnian type, right eye: Secondary | ICD-10-CM | POA: Diagnosis not present

## 2018-10-02 DIAGNOSIS — Z794 Long term (current) use of insulin: Secondary | ICD-10-CM | POA: Insufficient documentation

## 2018-10-02 DIAGNOSIS — Z961 Presence of intraocular lens: Secondary | ICD-10-CM | POA: Insufficient documentation

## 2018-10-02 DIAGNOSIS — E1036 Type 1 diabetes mellitus with diabetic cataract: Secondary | ICD-10-CM | POA: Diagnosis not present

## 2018-10-02 HISTORY — PX: CATARACT EXTRACTION W/PHACO: SHX586

## 2018-10-02 LAB — GLUCOSE, CAPILLARY: Glucose-Capillary: 197 mg/dL — ABNORMAL HIGH (ref 70–99)

## 2018-10-02 SURGERY — PHACOEMULSIFICATION, CATARACT, WITH IOL INSERTION
Anesthesia: Monitor Anesthesia Care | Site: Eye | Laterality: Right

## 2018-10-02 MED ORDER — BSS IO SOLN
INTRAOCULAR | Status: DC | PRN
  Administered 2018-10-02: 15 mL

## 2018-10-02 MED ORDER — PROVISC 10 MG/ML IO SOLN
INTRAOCULAR | Status: DC | PRN
  Administered 2018-10-02: 0.85 mL via INTRAOCULAR

## 2018-10-02 MED ORDER — SODIUM HYALURONATE 23 MG/ML IO SOLN
INTRAOCULAR | Status: DC | PRN
  Administered 2018-10-02 (×2): 0.6 mL via INTRAOCULAR

## 2018-10-02 MED ORDER — POVIDONE-IODINE 5 % OP SOLN
OPHTHALMIC | Status: DC | PRN
  Administered 2018-10-02: 1 via OPHTHALMIC

## 2018-10-02 MED ORDER — CYCLOPENTOLATE-PHENYLEPHRINE 0.2-1 % OP SOLN
1.0000 [drp] | OPHTHALMIC | Status: AC
  Administered 2018-10-02 (×3): 1 [drp] via OPHTHALMIC

## 2018-10-02 MED ORDER — LIDOCAINE HCL (PF) 1 % IJ SOLN
INTRAOCULAR | Status: DC | PRN
  Administered 2018-10-02: 1 mL via OPHTHALMIC

## 2018-10-02 MED ORDER — EPINEPHRINE PF 1 MG/ML IJ SOLN
INTRAOCULAR | Status: DC | PRN
  Administered 2018-10-02: 500 mL

## 2018-10-02 MED ORDER — TRYPAN BLUE 0.06 % OP SOLN
OPHTHALMIC | Status: DC | PRN
  Administered 2018-10-02: 0.5 mL via INTRAOCULAR

## 2018-10-02 MED ORDER — TETRACAINE HCL 0.5 % OP SOLN
1.0000 [drp] | OPHTHALMIC | Status: AC
  Administered 2018-10-02 (×3): 1 [drp] via OPHTHALMIC

## 2018-10-02 MED ORDER — PHENYLEPHRINE HCL 2.5 % OP SOLN
1.0000 [drp] | OPHTHALMIC | Status: AC
  Administered 2018-10-02 (×3): 1 [drp] via OPHTHALMIC

## 2018-10-02 MED ORDER — LIDOCAINE HCL 3.5 % OP GEL
1.0000 "application " | Freq: Once | OPHTHALMIC | Status: AC
  Administered 2018-10-02: 1 via OPHTHALMIC

## 2018-10-02 MED ORDER — NA HYALUR & NA CHOND-NA HYALUR 0.55-0.5 ML IO KIT
PACK | INTRAOCULAR | Status: DC | PRN
  Administered 2018-10-02: 1 via OPHTHALMIC

## 2018-10-02 SURGICAL SUPPLY — 15 items
CLOTH BEACON ORANGE TIMEOUT ST (SAFETY) ×1 IMPLANT
EYE SHIELD UNIVERSAL CLEAR (GAUZE/BANDAGES/DRESSINGS) ×1 IMPLANT
GLOVE BIOGEL PI IND STRL 6.5 (GLOVE) IMPLANT
GLOVE BIOGEL PI IND STRL 7.0 (GLOVE) IMPLANT
GLOVE BIOGEL PI INDICATOR 6.5 (GLOVE) ×1
GLOVE BIOGEL PI INDICATOR 7.0 (GLOVE) ×1
LENS ALC ACRYL/TECN (Ophthalmic Related) ×1 IMPLANT
NDL HYPO 18GX1.5 BLUNT FILL (NEEDLE) IMPLANT
NEEDLE HYPO 18GX1.5 BLUNT FILL (NEEDLE) ×2 IMPLANT
PAD ARMBOARD 7.5X6 YLW CONV (MISCELLANEOUS) ×1 IMPLANT
SYR TB 1ML LL NO SAFETY (SYRINGE) ×1 IMPLANT
TAPE SURG TRANSPORE 1 IN (GAUZE/BANDAGES/DRESSINGS) IMPLANT
TAPE SURGICAL TRANSPORE 1 IN (GAUZE/BANDAGES/DRESSINGS) ×1
VISCOELASTIC ADDITIONAL (OPHTHALMIC RELATED) ×3 IMPLANT
WATER STERILE IRR 250ML POUR (IV SOLUTION) ×1 IMPLANT

## 2018-10-02 NOTE — Transfer of Care (Signed)
Immediate Anesthesia Transfer of Care Note  Patient: Rick Lane  Procedure(s) Performed: CATARACT EXTRACTION PHACO AND INTRAOCULAR LENS PLACEMENT (IOC) (Right Eye)  Patient Location: Short Stay  Anesthesia Type:MAC  Level of Consciousness: awake, alert , oriented and patient cooperative  Airway & Oxygen Therapy: Patient Spontanous Breathing  Post-op Assessment: Report given to RN and Post -op Vital signs reviewed and stable  Post vital signs: Reviewed and stable  Last Vitals:  Vitals Value Taken Time  BP    Temp    Pulse    Resp    SpO2      Last Pain:  Vitals:   10/02/18 0733  TempSrc: Oral  PainSc: 0-No pain      Patients Stated Pain Goal: 6 (38/18/29 [id number])  Complications: No apparent anesthesia complications

## 2018-10-02 NOTE — Anesthesia Preprocedure Evaluation (Signed)
Anesthesia Evaluation  Patient identified by MRN, date of birth, ID band Patient awake    Reviewed: Allergy & Precautions, NPO status , Patient's Chart, lab work & pertinent test results  Airway Mallampati: II  TM Distance: >3 FB Neck ROM: Full    Dental no notable dental hx. (+) Missing   Pulmonary asthma , sleep apnea ,  Remote childhood asthma - no inhalers in over 30 + years  OSA - unable to tolerate CPAP -hasn't used in years    Pulmonary exam normal breath sounds clear to auscultation       Cardiovascular Exercise Tolerance: Good negative cardio ROS Normal cardiovascular examI Rhythm:Regular Rate:Normal  States sedentary -EF in 2018 ~54%   Neuro/Psych Depression negative neurological ROS  negative psych ROS   GI/Hepatic negative GI ROS, Neg liver ROS,   Endo/Other  negative endocrine ROSdiabetes, Well Controlled, Type 1, Insulin Dependent  Renal/GU negative Renal ROS  negative genitourinary   Musculoskeletal negative musculoskeletal ROS (+)   Abdominal   Peds negative pediatric ROS (+)  Hematology negative hematology ROS (+)   Anesthesia Other Findings   Reproductive/Obstetrics negative OB ROS                             Anesthesia Physical Anesthesia Plan  ASA: II  Anesthesia Plan: MAC   Post-op Pain Management:    Induction: Intravenous  PONV Risk Score and Plan:   Airway Management Planned: Nasal Cannula  Additional Equipment:   Intra-op Plan:   Post-operative Plan:   Informed Consent: I have reviewed the patients History and Physical, chart, labs and discussed the procedure including the risks, benefits and alternatives for the proposed anesthesia with the patient or authorized representative who has indicated his/her understanding and acceptance.     Dental advisory given  Plan Discussed with: CRNA  Anesthesia Plan Comments: (MAC d/w pt and Wife -WTP with  same )        Anesthesia Quick Evaluation

## 2018-10-02 NOTE — H&P (Signed)
The H and P was reviewed and updated. The patient was examined.  No changes were found after exam.  The surgical eye was marked.  

## 2018-10-02 NOTE — Anesthesia Postprocedure Evaluation (Signed)
Anesthesia Post Note  Patient: Rick Lane  Procedure(s) Performed: CATARACT EXTRACTION PHACO AND INTRAOCULAR LENS PLACEMENT (IOC) (Right Eye)  Patient location during evaluation: Short Stay Anesthesia Type: MAC Level of consciousness: awake and alert and oriented Pain management: pain level controlled Vital Signs Assessment: post-procedure vital signs reviewed and stable Respiratory status: spontaneous breathing Cardiovascular status: stable Postop Assessment: no apparent nausea or vomiting Anesthetic complications: no     Last Vitals:  Vitals:   10/02/18 0733 10/02/18 0914  BP: (!) 176/80 (!) 173/81  Pulse: 87 81  Resp: 16 16  Temp: 36.7 C 36.8 C  SpO2: 95% 95%    Last Pain:  Vitals:   10/02/18 0914  TempSrc: Oral  PainSc: 0-No pain                 ADAMS, AMY A

## 2018-10-02 NOTE — Discharge Instructions (Addendum)
PATIENT INSTRUCTIONS °POST-ANESTHESIA ° °IMMEDIATELY FOLLOWING SURGERY:  Do not drive or operate machinery for the first twenty four hours after surgery.  Do not make any important decisions for twenty four hours after surgery or while taking narcotic pain medications or sedatives.  If you develop intractable nausea and vomiting or a severe headache please notify your doctor immediately. ° °FOLLOW-UP:  Please make an appointment with your surgeon as instructed. You do not need to follow up with anesthesia unless specifically instructed to do so. ° °WOUND CARE INSTRUCTIONS (if applicable):  Keep a dry clean dressing on the anesthesia/puncture wound site if there is drainage.  Once the wound has quit draining you may leave it open to air.  Generally you should leave the bandage intact for twenty four hours unless there is drainage.  If the epidural site drains for more than 36-48 hours please call the anesthesia department. ° °QUESTIONS?:  Please feel free to call your physician or the hospital operator if you have any questions, and they will be happy to assist you.    ° ° °Please discharge patient when stable, will follow up today with Dr. Wrzosek at the [facility] office immediately following discharge.  Leave shield in place until visit.  All paperwork with discharge instructions will be given at the office. ° °

## 2018-10-02 NOTE — Op Note (Signed)
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 Shajuana Mclucas, MD, MA  Anesthesia: MAC, Topical [place]  Complications: None  Estimated Blood Loss: <47m (minimal)  Specimens: None  Implants: As above  Indications:  Mature Visually significant age-related cataract, Right Eye  Procedure:  The patient was seen and identified in the pre-operative area. The operative eye was identified and dilated.  The operative eye was marked.  Topical anesthesia was administered to the operative eye.     The patient was then to the operative suite and placed in the supine position.  A timeout was performed confirming the patient, procedure to be performed, and all other relevant information.   The patient's face was prepped and draped in the usual fashion for intra-ocular surgery.  A lid speculum was placed into the operative eye and the surgical microscope moved into place and focused.  A lack of red reflex due to a mature cataract was confirmed.  A superotemporal paracentesis was created using a 20 gauge paracentesis blade.  Vision blue was injected into the anterior chamber.  Shugarcaine was injected into the anterior chamber.  Viscoelastic was injected into the anterior chamber.  A temporal clear-corneal main wound incision was created using a 2.43mmicrokeratome.  A continuous curvilinear capsulorrhexis was initiated using an irrigating cystitome and completed using capsulorrhexis forceps.  Hydrodissection and hydrodeliniation were performed.  Viscoelastic was injected into the anterior chamber.  A phacoemulsification handpiece and a chopper as a second instrument were used  to remove the nucleus and epinucleus. The irrigation/aspiration handpiece was used to remove any remaining cortical material.   The capsular bag was reinflated with viscoelastic, checked, and found to be intact. The intraocular lens was inserted into the capsular bag and dialed into place using a kuglen hook.  The irrigation/aspiration handpiece was used to remove any remaining viscoelastic.  The clear corneal wound and paracentesis wounds were then hydrated and checked with Weck-Cels to be watertight.  The lid-speculum and drape was removed, and the patient's face was cleaned with a wet and dry 4x4.  The patient was taken to the post-operative care unit in good condition, having tolerated the procedure well.  Post-Op Instructions: [facility] and will receive all other orders and instructions.

## 2018-10-03 ENCOUNTER — Encounter (HOSPITAL_COMMUNITY): Payer: Self-pay | Admitting: Ophthalmology

## 2018-10-03 NOTE — Addendum Note (Signed)
Addendum  created 10/03/18 1357 by Ollen Bowl, CRNA   Intraprocedure Event edited

## 2018-10-10 ENCOUNTER — Other Ambulatory Visit: Payer: Self-pay

## 2018-10-10 ENCOUNTER — Telehealth: Payer: Self-pay | Admitting: Family Medicine

## 2018-10-10 ENCOUNTER — Ambulatory Visit (INDEPENDENT_AMBULATORY_CARE_PROVIDER_SITE_OTHER): Payer: Medicare HMO | Admitting: Family Medicine

## 2018-10-10 ENCOUNTER — Encounter: Payer: Self-pay | Admitting: Family Medicine

## 2018-10-10 VITALS — BP 140/69 | HR 79 | Temp 98.5°F | Ht 74.0 in | Wt 291.0 lb

## 2018-10-10 DIAGNOSIS — Z794 Long term (current) use of insulin: Secondary | ICD-10-CM

## 2018-10-10 DIAGNOSIS — E11319 Type 2 diabetes mellitus with unspecified diabetic retinopathy without macular edema: Secondary | ICD-10-CM | POA: Diagnosis not present

## 2018-10-10 DIAGNOSIS — L97422 Non-pressure chronic ulcer of left heel and midfoot with fat layer exposed: Secondary | ICD-10-CM | POA: Diagnosis not present

## 2018-10-10 DIAGNOSIS — Z1211 Encounter for screening for malignant neoplasm of colon: Secondary | ICD-10-CM | POA: Diagnosis not present

## 2018-10-10 DIAGNOSIS — E1169 Type 2 diabetes mellitus with other specified complication: Secondary | ICD-10-CM | POA: Diagnosis not present

## 2018-10-10 DIAGNOSIS — E785 Hyperlipidemia, unspecified: Secondary | ICD-10-CM | POA: Diagnosis not present

## 2018-10-10 DIAGNOSIS — E11621 Type 2 diabetes mellitus with foot ulcer: Secondary | ICD-10-CM | POA: Diagnosis not present

## 2018-10-10 DIAGNOSIS — Z961 Presence of intraocular lens: Secondary | ICD-10-CM | POA: Diagnosis not present

## 2018-10-10 MED ORDER — INSULIN REGULAR HUMAN 100 UNIT/ML IJ SOLN: INTRAMUSCULAR | 11 refills | Status: DC

## 2018-10-10 MED ORDER — INSULIN DEGLUDEC 200 UNIT/ML ~~LOC~~ SOPN: 50.0000 [IU] | PEN_INJECTOR | Freq: Two times a day (BID) | SUBCUTANEOUS | 11 refills | Status: DC

## 2018-10-10 NOTE — Patient Instructions (Signed)
Your sugar continues to be elevated but is looking better than last check.    I want you to monitor your meal time blood sugars and record.  Increase the Novolog to 12 units with your TWO biggest meals.  Ok to use 10 units with your smallest meal.  See me in 3 months for recheck.  Keep a close eye on that foot ulcer.  If it starts to get bigger or deeper, I want to see you sooner.   Diabetes Mellitus and Foot Care Foot care is an important part of your health, especially when you have diabetes. Diabetes may cause you to have problems because of poor blood flow (circulation) to your feet and legs, which can cause your skin to:  Become thinner and drier.  Break more easily.  Heal more slowly.  Peel and crack. You may also have nerve damage (neuropathy) in your legs and feet, causing decreased feeling in them. This means that you may not notice minor injuries to your feet that could lead to more serious problems. Noticing and addressing any potential problems early is the best way to prevent future foot problems. How to care for your feet Foot hygiene  Wash your feet daily with warm water and mild soap. Do not use hot water. Then, pat your feet and the areas between your toes until they are completely dry. Do not soak your feet as this can dry your skin.  Trim your toenails straight across. Do not dig under them or around the cuticle. File the edges of your nails with an emery board or nail file.  Apply a moisturizing lotion or petroleum jelly to the skin on your feet and to dry, brittle toenails. Use lotion that does not contain alcohol and is unscented. Do not apply lotion between your toes. Shoes and socks  Wear clean socks or stockings every day. Make sure they are not too tight. Do not wear knee-high stockings since they may decrease blood flow to your legs.  Wear shoes that fit properly and have enough cushioning. Always look in your shoes before you put them on to be sure there  are no objects inside.  To break in new shoes, wear them for just a few hours a day. This prevents injuries on your feet. Wounds, scrapes, corns, and calluses  Check your feet daily for blisters, cuts, bruises, sores, and redness. If you cannot see the bottom of your feet, use a mirror or ask someone for help.  Do not cut corns or calluses or try to remove them with medicine.  If you find a minor scrape, cut, or break in the skin on your feet, keep it and the skin around it clean and dry. You may clean these areas with mild soap and water. Do not clean the area with peroxide, alcohol, or iodine.  If you have a wound, scrape, corn, or callus on your foot, look at it several times a day to make sure it is healing and not infected. Check for: ? Redness, swelling, or pain. ? Fluid or blood. ? Warmth. ? Pus or a bad smell. General instructions  Do not cross your legs. This may decrease blood flow to your feet.  Do not use heating pads or hot water bottles on your feet. They may burn your skin. If you have lost feeling in your feet or legs, you may not know this is happening until it is too late.  Protect your feet from hot and cold by wearing  shoes, such as at the beach or on hot pavement.  Schedule a complete foot exam at least once a year (annually) or more often if you have foot problems. If you have foot problems, report any cuts, sores, or bruises to your health care provider immediately. Contact a health care provider if:  You have a medical condition that increases your risk of infection and you have any cuts, sores, or bruises on your feet.  You have an injury that is not healing.  You have redness on your legs or feet.  You feel burning or tingling in your legs or feet.  You have pain or cramps in your legs and feet.  Your legs or feet are numb.  Your feet always feel cold.  You have pain around a toenail. Get help right away if:  You have a wound, scrape, corn, or  callus on your foot and: ? You have pain, swelling, or redness that gets worse. ? You have fluid or blood coming from the wound, scrape, corn, or callus. ? Your wound, scrape, corn, or callus feels warm to the touch. ? You have pus or a bad smell coming from the wound, scrape, corn, or callus. ? You have a fever. ? You have a red line going up your leg. Summary  Check your feet every day for cuts, sores, red spots, swelling, and blisters.  Moisturize feet and legs daily.  Wear shoes that fit properly and have enough cushioning.  If you have foot problems, report any cuts, sores, or bruises to your health care provider immediately.  Schedule a complete foot exam at least once a year (annually) or more often if you have foot problems. This information is not intended to replace advice given to you by your health care provider. Make sure you discuss any questions you have with your health care provider. Document Released: 07/09/2000 Document Revised: 08/24/2017 Document Reviewed: 08/13/2016 Elsevier Interactive Patient Education  2019 [employer].

## 2018-10-10 NOTE — Telephone Encounter (Signed)
Patient has agreed to have colonoscopy.  Order placed.

## 2018-10-10 NOTE — Progress Notes (Signed)
Subjective: CC: DM2, HLD PCP: Janora Norlander, DO IZT:[id number] Rick Lane is a 68 y.o. male presenting to clinic today for:  1. Type 2 Diabetes w/ hyperlipidemia Patient reports blood sugars anywhere from 119-200s (after bread) .  He is injecting 50units of Tresiba twice daily and 10 units of Novolin R q. meal.,  He is also taking Lipitor 20 mg daily.  Side effects: None. Last A1c:  Lab Results  Component Value Date   HGBA1C 7.6 (H) 09/25/2018   Nephropathy screen indicated?:  Was performed in July 2019 and was negative Last flu, zoster and/or pneumovax: UTD, needs TDap   ROS: No chest pain, shortness of breath.  He reports chronic numbness and tingling in the left lower extremity.  He has a right BKA.  He notes that he does have a small ulceration on the lateral aspect of the left foot that his wife has been keeping an eye on.  It had gotten much smaller but is now starting a little bit bigger.  He is using Silvadene cream to the affected area.  Additionally, he recently had a cataract surgery and states that the vision seems to be getting a lot better since that surgery.  ROS: Per HPI  Allergies  Allergen Reactions  . Bactrim [Sulfamethoxazole-Trimethoprim] Itching  . Gentamicin Other (See Comments)    Unknown   Past Medical History:  Diagnosis Date  . Asthma    ass a child  . Cataract   . Depression   . Diabetes mellitus without complication ([place])    Type II  . Hyperlipidemia   . Neuropathy   . Neuropathy   . Sleep apnea     Current Outpatient Medications:  .  atorvastatin (LIPITOR) 20 MG tablet, Take 1 tablet (20 mg total) by mouth daily., Disp: 90 tablet, Rfl: 3 .  Insulin Degludec (TRESIBA FLEXTOUCH) 200 UNIT/ML SOPN, Inject 48 Units into the skin 2 (two) times daily. (Patient taking differently: Inject 46 Units into the skin 2 (two) times daily. ), Disp: 18 mL, Rfl: 11 .  insulin regular (NOVOLIN R,HUMULIN R) 100 units/mL injection, Inject 0.1 mLs (10 Units total)  into the skin 3 (three) times daily before meals., Disp: 10 mL, Rfl: 11 .  Insulin Syringe-Needle U-100 (RELION INSULIN SYR 0.3CC/30G) 30G X 5/16" 0.3 ML MISC, 1 Syringe by Does not apply route 3 (three) times daily. Dx E11.42, Disp: 100 each, Rfl: 11 Social History   Socioeconomic History  . Marital status: Married    Spouse name: Not on file  . Number of children: 0  . Years of education: 45  . Highest education level: Not on file  Occupational History  . Occupation: Scientist, research (physical sciences)    Comment: [employer]  . Financial resource strain: Not on file  . Food insecurity:    Worry: Not on file    Inability: Not on file  . Transportation needs:    Medical: Not on file    Non-medical: Not on file  Tobacco Use  . Smoking status: Never Smoker  . Smokeless tobacco: Never Used  Substance and Sexual Activity  . Alcohol use: No  . Drug use: No  . Sexual activity: Yes    Birth control/protection: None  Lifestyle  . Physical activity:    Days per week: Not on file    Minutes per session: Not on file  . Stress: Not on file  Relationships  . Social connections:    Talks on phone: Not on file  Gets together: Not on file    Attends religious service: Not on file    Active member of club or organization: Not on file    Attends meetings of clubs or organizations: Not on file    Relationship status: Not on file  . Intimate partner violence:    Fear of current or ex partner: Not on file    Emotionally abused: Not on file    Physically abused: Not on file    Forced sexual activity: Not on file  Other Topics Concern  . Not on file  Social History Narrative  . Not on file   Family History  Problem Relation Age of Onset  . Heart disease Father   . Diabetes Sister   . Diabetes Brother   . Heart attack Paternal Grandfather     Objective: Office vital signs reviewed. BP 140/69   Pulse 79   Temp 98.5 F (36.9 C) (Oral)   Ht 6\' 2"  (2.50 m)   Wt 291 lb (132 kg)   BMI  37.36 kg/m   Physical Examination:  General: Awake, alert, well nourished, No acute distress HEENT: Normal; sclera white, MMM Cardio: regular rate and rhythm, S1S2 heard, no murmurs appreciated Pulm: clear to auscultation bilaterally, no wheezes, rhonchi or rales; normal work of breathing on room air Extremities: Right lower extremity with BKA and prosthetic in place. LLE without edema.  He has a 2 mm foot ulcer noted on the left lateral aspect of the midfoot.  He is globally decreased sensation to the left foot.  See diabetic foot exam below.  Diabetic Foot Exam - Simple   Simple Foot Form Diabetic Foot exam was performed with the following findings:  Yes 10/10/2018  5:47 PM  Visual Inspection See comments:  Yes Sensation Testing See comments:  Yes Pulse Check See comments:  Yes Comments Reduced posterior tibialis pulse.  Dorsalis pulse intact.  He has absent monofilament testing throughout the foot.  There is a 2 mm ulcer with some fat exposure noted along the left lateral midfoot.  No active drainage, induration or evidence of secondary infection.    Assessment/ Plan: 68 y.o. male   1. Type 2 diabetes mellitus with retinopathy without macular edema, with long-term current use of insulin, unspecified laterality, unspecified retinopathy severity ([place]) Not at goal with A1c of 7.6.  This was obtained about 2 weeks ago by his eye doctor prior to his surgery.  I have advised him to increase the NovoLog to 12 units with his 2 largest meals and continue 10 units with small meal.  I would like him to monitor postprandial sugars, as he is reporting during our visit that his morning sugars tend to be less than 140.  If we cannot get him at goal, particularly given ongoing diabetic ulcer low threshold to refer to endocrinology for assistance with this patient.  2. Hyperlipidemia associated with type 2 diabetes mellitus ([place]) Continue statin  3. Diabetic ulcer of left midfoot associated with  type 2 diabetes mellitus, with fat layer exposed ([place]) No evidence of secondary infection.  Continue wound care.  If he cannot heal the wound on his own or he develops any concerning complications he will contact me and we will plan accordingly  4. Colon cancer screening Referral placed to gastroenterology for colon cancer screening. - Ambulatory referral to Gastroenterology   Orders Placed This Encounter  Procedures  . Ambulatory referral to Gastroenterology    Referral Priority:   Routine    Referral  Type:   Consultation    Referral Reason:   Specialty Services Required    Number of Visits Requested:   1   Meds ordered this encounter  Medications  . Insulin Degludec (TRESIBA FLEXTOUCH) 200 UNIT/ML SOPN    Sig: Inject 50 Units into the skin 2 (two) times daily.    Dispense:  18 mL    Refill:  11    QS for 1 month, 1.5 box is nearest amount so round up to 2 boxes if possible, ( 5 pens needed to cover month)  . insulin regular (NOVOLIN R,HUMULIN R) 100 units/mL injection    Sig: Inject 12 units with 2 largest meals of the day and 10 units with smallest meal of the day    Dispense:  10 mL    Refill:  [place], DO [place] [phone]

## 2018-10-11 ENCOUNTER — Encounter (INDEPENDENT_AMBULATORY_CARE_PROVIDER_SITE_OTHER): Payer: Self-pay | Admitting: *Deleted

## 2018-10-13 ENCOUNTER — Telehealth (INDEPENDENT_AMBULATORY_CARE_PROVIDER_SITE_OTHER): Payer: Self-pay | Admitting: Orthopedic Surgery

## 2018-10-13 NOTE — Telephone Encounter (Signed)
Pt called asking if autumn can give him a call back.

## 2018-10-16 NOTE — Telephone Encounter (Signed)
Please call the pt. We need to make an appt for him to come in the office. Insurance will require that he has been evaluated in the office within 1 year. He was last seen in 2018. Please call and make appt for him to come in at any time.

## 2018-10-16 NOTE — Telephone Encounter (Signed)
Pt left VM saying his prosthetic leg broke. Gerald Stabs from [facility] told him to call and speak with Dr. Sharol Given.

## 2018-11-15 DIAGNOSIS — D225 Melanocytic nevi of trunk: Secondary | ICD-10-CM | POA: Diagnosis not present

## 2018-11-15 DIAGNOSIS — L821 Other seborrheic keratosis: Secondary | ICD-10-CM | POA: Diagnosis not present

## 2018-11-21 ENCOUNTER — Other Ambulatory Visit: Payer: Self-pay | Admitting: *Deleted

## 2019-01-05 ENCOUNTER — Ambulatory Visit: Payer: Medicare Other

## 2019-01-08 ENCOUNTER — Telehealth: Payer: Self-pay

## 2019-01-08 NOTE — Telephone Encounter (Signed)
Rx will be filled out and faxed today, thank you.

## 2019-01-08 NOTE — Telephone Encounter (Signed)
Patient would like a Rx for a prosthetic for his left leg.  Stated that the one he had was broken per Gerald Stabs at [facility].  Stated that he would need a K-3 instead of a K-2 per Gerald Stabs.  Cb# is [phone].  Please advise.  Thank You.

## 2019-01-09 ENCOUNTER — Other Ambulatory Visit: Payer: Self-pay

## 2019-01-10 ENCOUNTER — Encounter: Payer: Self-pay | Admitting: Family Medicine

## 2019-01-10 ENCOUNTER — Ambulatory Visit (INDEPENDENT_AMBULATORY_CARE_PROVIDER_SITE_OTHER): Payer: Medicare HMO | Admitting: Family Medicine

## 2019-01-10 DIAGNOSIS — E785 Hyperlipidemia, unspecified: Secondary | ICD-10-CM | POA: Diagnosis not present

## 2019-01-10 DIAGNOSIS — Z89511 Acquired absence of right leg below knee: Secondary | ICD-10-CM

## 2019-01-10 DIAGNOSIS — Z794 Long term (current) use of insulin: Secondary | ICD-10-CM | POA: Diagnosis not present

## 2019-01-10 DIAGNOSIS — E11319 Type 2 diabetes mellitus with unspecified diabetic retinopathy without macular edema: Secondary | ICD-10-CM

## 2019-01-10 DIAGNOSIS — E1169 Type 2 diabetes mellitus with other specified complication: Secondary | ICD-10-CM | POA: Diagnosis not present

## 2019-01-10 NOTE — Progress Notes (Signed)
Telephone visit  Subjective: CC: DM2 PCP: Janora Norlander, DO NTI:Rick Lane is a 68 y.o. male calls for telephone consult today. Patient provides verbal consent for consult held via phone.  Location of patient: home Location of provider: [facility] Others present for call: none  1. Type 2 Diabetes w/ diabetic vision changes and hx BKA, right:  Patient report High at home: 200; Low at home: 87 (last week, some shakiness), FBG: 100-120s, Avg 127. Taking medication(s): Tresiba 50u BID, Novolin 10-12 w/ each meal.  Lipitor 20mg  daily.  Last eye exam: 05/2018 Last foot exam: 09/2018 Last A1c:  Lab Results  Component Value Date   HGBA1C 7.6 (H) 09/25/2018   Nephropathy screen indicated?: due 12/2018 Last flu, zoster and/or pneumovax: Needs PNA vaccine   There is no immunization history on file for this patient.  ROS: Denies dizziness, CP, LOC. He reports a small lesion on the lateral left foot.  He is working on getting a "heavy duty leg".  Allergies  Allergen Reactions  . Bactrim [Sulfamethoxazole-Trimethoprim] Itching  . Gentamicin Other (See Comments)    Unknown   Past Medical History:  Diagnosis Date  . Asthma    ass a child  . Cataract   . Depression   . Diabetes mellitus without complication ([place])    Type II  . Hyperlipidemia   . Neuropathy   . Neuropathy   . Sleep apnea     Current Outpatient Medications:  .  atorvastatin (LIPITOR) 20 MG tablet, Take 1 tablet (20 mg total) by mouth daily., Disp: 90 tablet, Rfl: 3 .  Insulin Degludec (TRESIBA FLEXTOUCH) 200 UNIT/ML SOPN, Inject 50 Units into the skin 2 (two) times daily., Disp: 18 mL, Rfl: 11 .  insulin regular (NOVOLIN R,HUMULIN R) 100 units/mL injection, Inject 12 units with 2 largest meals of the day and 10 units with smallest meal of the day, Disp: 10 mL, Rfl: 11 .  Insulin Syringe-Needle U-100 (RELION INSULIN SYR 0.3CC/30G) 30G X 5/16" 0.3 ML MISC, 1 Syringe by Does not apply route 3 (three) times daily. Dx  E11.42, Disp: 100 each, Rfl: 11 .  Prednisolon-Gatiflox-Bromfenac 1-0.5-0.075 % SOLN, Apply to eye., Disp: , Rfl:   Assessment/ Plan: 68 y.o. male   1. Type 2 diabetes mellitus with retinopathy without macular edema, with long-term current use of insulin, unspecified laterality, unspecified retinopathy severity ([place]) Based on average blood sugars I assume that he is pretty close to goal.  Continue current regimen.  Continue statin.  Plan for lipid panel, A1c at next visit.  2. Hyperlipidemia associated with type 2 diabetes mellitus ([place]) Continue statin  3. S/P unilateral BKA (below knee amputation), right ([place]) Working on getting a prosthetic replacement.  I encouraged him to call me if he needs any forms completed be glad to sign these for him.   Start time: 1:52pm End time: 2:01pm  Total time spent on patient care (including telephone call/ virtual visit): 15 minutes  [place], [place] [phone]

## 2019-02-14 DIAGNOSIS — E7800 Pure hypercholesterolemia, unspecified: Secondary | ICD-10-CM | POA: Diagnosis not present

## 2019-02-14 DIAGNOSIS — H5200 Hypermetropia, unspecified eye: Secondary | ICD-10-CM | POA: Diagnosis not present

## 2019-02-14 DIAGNOSIS — D3131 Benign neoplasm of right choroid: Secondary | ICD-10-CM | POA: Diagnosis not present

## 2019-02-14 DIAGNOSIS — Z0100 Encounter for examination of eyes and vision without abnormal findings: Secondary | ICD-10-CM | POA: Diagnosis not present

## 2019-02-14 DIAGNOSIS — Z961 Presence of intraocular lens: Secondary | ICD-10-CM | POA: Diagnosis not present

## 2019-02-14 DIAGNOSIS — E109 Type 1 diabetes mellitus without complications: Secondary | ICD-10-CM | POA: Diagnosis not present

## 2019-02-22 DIAGNOSIS — Z89511 Acquired absence of right leg below knee: Secondary | ICD-10-CM | POA: Diagnosis not present

## 2019-03-23 ENCOUNTER — Other Ambulatory Visit: Payer: Self-pay

## 2019-03-23 ENCOUNTER — Other Ambulatory Visit: Payer: Self-pay | Admitting: Family Medicine

## 2019-03-23 ENCOUNTER — Other Ambulatory Visit: Payer: Medicare HMO

## 2019-03-23 DIAGNOSIS — Z1159 Encounter for screening for other viral diseases: Secondary | ICD-10-CM

## 2019-03-23 DIAGNOSIS — E1169 Type 2 diabetes mellitus with other specified complication: Secondary | ICD-10-CM

## 2019-03-23 DIAGNOSIS — E785 Hyperlipidemia, unspecified: Secondary | ICD-10-CM

## 2019-03-23 DIAGNOSIS — E11319 Type 2 diabetes mellitus with unspecified diabetic retinopathy without macular edema: Secondary | ICD-10-CM | POA: Diagnosis not present

## 2019-03-23 DIAGNOSIS — Z794 Long term (current) use of insulin: Secondary | ICD-10-CM | POA: Diagnosis not present

## 2019-03-23 LAB — BAYER DCA HB A1C WAIVED: HB A1C (BAYER DCA - WAIVED): 6.9 % (ref <7.0)

## 2019-03-24 LAB — MICROALBUMIN / CREATININE URINE RATIO
Creatinine, Urine: 49.1 mg/dL
Microalb/Creat Ratio: 43 mg/g creat — ABNORMAL HIGH (ref 0–29)
Microalbumin, Urine: 21.2 ug/mL

## 2019-03-24 LAB — CMP14+EGFR
ALT: 71 IU/L — ABNORMAL HIGH (ref 0–44)
AST: 42 IU/L — ABNORMAL HIGH (ref 0–40)
Albumin/Globulin Ratio: 2 (ref 1.2–2.2)
Albumin: 4.7 g/dL (ref 3.8–4.8)
Alkaline Phosphatase: 41 IU/L (ref 39–117)
BUN/Creatinine Ratio: 12 (ref 10–24)
BUN: 14 mg/dL (ref 8–27)
Bilirubin Total: 0.5 mg/dL (ref 0.0–1.2)
CO2: 23 mmol/L (ref 20–29)
Calcium: 9.9 mg/dL (ref 8.6–10.2)
Chloride: 105 mmol/L (ref 96–106)
Creatinine, Ser: 1.16 mg/dL (ref 0.76–1.27)
GFR calc Af Amer: 75 mL/min/{1.73_m2} (ref >59)
GFR calc non Af Amer: 65 mL/min/{1.73_m2} (ref >59)
Globulin, Total: 2.4 g/dL (ref 1.5–4.5)
Glucose: 197 mg/dL — ABNORMAL HIGH (ref 65–99)
Potassium: 5.2 mmol/L (ref 3.5–5.2)
Sodium: 143 mmol/L (ref 134–144)
Total Protein: 7.1 g/dL (ref 6.0–8.5)

## 2019-03-24 LAB — LIPID PANEL
Chol/HDL Ratio: 6.1 ratio — ABNORMAL HIGH (ref 0.0–5.0)
Cholesterol, Total: 218 mg/dL — ABNORMAL HIGH (ref 100–199)
HDL: 36 mg/dL — ABNORMAL LOW (ref >39)
LDL Calculated: 142 mg/dL — ABNORMAL HIGH (ref 0–99)
Triglycerides: 200 mg/dL — ABNORMAL HIGH (ref 0–149)
VLDL Cholesterol Cal: 40 mg/dL (ref 5–40)

## 2019-03-24 LAB — HEPATITIS C ANTIBODY: Hep C Virus Ab: 0.1 s/co ratio (ref 0.0–0.9)

## 2019-03-26 ENCOUNTER — Other Ambulatory Visit: Payer: Self-pay

## 2019-03-27 ENCOUNTER — Encounter: Payer: Self-pay | Admitting: Family Medicine

## 2019-03-27 ENCOUNTER — Ambulatory Visit (INDEPENDENT_AMBULATORY_CARE_PROVIDER_SITE_OTHER): Payer: Medicare HMO | Admitting: Family Medicine

## 2019-03-27 VITALS — BP 134/80 | HR 89 | Temp 98.0°F | Ht 74.0 in | Wt 287.0 lb

## 2019-03-27 DIAGNOSIS — E1142 Type 2 diabetes mellitus with diabetic polyneuropathy: Secondary | ICD-10-CM | POA: Diagnosis not present

## 2019-03-27 DIAGNOSIS — E1169 Type 2 diabetes mellitus with other specified complication: Secondary | ICD-10-CM | POA: Diagnosis not present

## 2019-03-27 DIAGNOSIS — E1129 Type 2 diabetes mellitus with other diabetic kidney complication: Secondary | ICD-10-CM | POA: Diagnosis not present

## 2019-03-27 DIAGNOSIS — E785 Hyperlipidemia, unspecified: Secondary | ICD-10-CM | POA: Diagnosis not present

## 2019-03-27 DIAGNOSIS — Z89511 Acquired absence of right leg below knee: Secondary | ICD-10-CM | POA: Diagnosis not present

## 2019-03-27 DIAGNOSIS — R809 Proteinuria, unspecified: Secondary | ICD-10-CM

## 2019-03-27 DIAGNOSIS — Z794 Long term (current) use of insulin: Secondary | ICD-10-CM | POA: Diagnosis not present

## 2019-03-27 MED ORDER — ATORVASTATIN CALCIUM 20 MG PO TABS: 20.0000 mg | ORAL_TABLET | Freq: Every day | ORAL | 3 refills | Status: DC

## 2019-03-27 MED ORDER — LISINOPRIL 5 MG PO TABS: 5.0000 mg | ORAL_TABLET | Freq: Every day | ORAL | 0 refills | Status: DC

## 2019-03-27 NOTE — Progress Notes (Signed)
Subjective: CC: DM2, HLD PCP: Janora Norlander, DO GEX:BMWUXLK [place] is a 68 y.o. male presenting to clinic today for:  1. Type 2 Diabetes w/ hyperlipidemia Patient reports blood sugars anywhere from 100-140.  He is injecting 50units of Tresiba twice daily and 10 units of Novolin R 10-12u q. meal.,  He notes that he stopped lipitor 19m but can't remember why, he had been on it for years.  Last eye exam: [place]  Lab Results  Component Value Date   HGBA1C 6.9 03/23/2019   Nephropathy screen indicated?:  8/28 w/ moderate microalbuminuria Last flu, zoster and/or pneumovax: Tdap, flu  ROS: No chest pain, shortness of breath.  He notes that the ulcers that were on the left side of the foot have since healed.  He does have some discoloration of the great toenail and will be seeing podiatry for this soon.  ROS: Per HPI  Allergies  Allergen Reactions  . Bactrim [Sulfamethoxazole-Trimethoprim] Itching  . Gentamicin Other (See Comments)    Unknown   Past Medical History:  Diagnosis Date  . Asthma    ass a child  . Cataract   . 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 Depression   . Diabetes mellitus without complication ([place]    Type II  . Hyperlipidemia   . Neuropathy   . Neuropathy   . Sleep apnea     Current Outpatient Medications:  .  atorvastatin (LIPITOR) 20 MG tablet, Take 1 tablet (20 mg total) by mouth daily., Disp: 90 tablet, Rfl: 3 .  Insulin Degludec (TRESIBA FLEXTOUCH) 200 UNIT/ML SOPN, Inject 50 Units into the skin 2 (two) times daily., Disp: 18 mL, Rfl: 11 .  insulin regular (NOVOLIN R,HUMULIN R) 100 units/mL injection, Inject 12 units with 2 largest meals of the day and 10 units with smallest meal of the day, Disp: 10 mL, Rfl: 11 .  Insulin Syringe-Needle U-100 (RELION INSULIN SYR 0.3CC/30G) 30G X 5/16" 0.3 ML MISC, 1 Syringe by Does not apply route 3 (three)  times daily. Dx E11.42, Disp: 100 each, Rfl: 11 .  Prednisolon-Gatiflox-Bromfenac 1-0.5-0.075 % SOLN, Apply to eye., Disp: , Rfl:  Social History   Socioeconomic History  . Marital status: Married    Spouse name: Not on file  . Number of children: 0  . Years of education: 122 . Highest education level: Not on file  Occupational History  . Occupation: pScientist, research (physical sciences)   Comment: [employer] . Financial resource strain: Not on file  . Food insecurity    Worry: Not on file    Inability: Not on file  . Transportation needs    Medical: Not on file    Non-medical: Not on file  Tobacco Use  . Smoking status: Never Smoker  . Smokeless tobacco: Never Used  Substance and Sexual Activity  . Alcohol use: No  . Drug use: No  . Sexual activity: Yes    Birth control/protection: None  Lifestyle  . Physical activity    Days per week: Not on file    Minutes per session: Not on file  . Stress: Not on file  Relationships  . Social [place] phone: Not on file    Gets together: Not on file    Attends religious service: Not on file    Active member of club or organization: Not on file  Attends meetings of clubs or organizations: Not on file    Relationship status: Not on file  . Intimate partner violence    Fear of current or ex partner: Not on file    Emotionally abused: Not on file    Physically abused: Not on file    Forced sexual activity: Not on file  Other Topics Concern  . Not on file  Social History Narrative  . Not on file   Family History  Problem Relation Age of Onset  . Heart disease Father   . Diabetes Sister   . Diabetes Brother   . Heart attack Paternal Grandfather     Objective: Office vital signs reviewed. BP 134/80   Pulse 89   Temp 98 F (36.7 C) (Temporal)   Ht 6' 2"  (1.88 m)   Wt 287 lb (130.2 kg)   BMI 36.85 kg/m   Physical Examination:  General: Awake, alert, well nourished, No acute distress HEENT: Normal; sclera  white, MMM; no carotid bruits Cardio: regular rate and rhythm, S1S2 heard, no murmurs appreciated Pulm: clear to auscultation bilaterally, no wheezes, rhonchi or rales; normal work of breathing on room air Extremities: Surgically absent right lower extremity; using prosthesis. LLE without edema.    The 10-year ASCVD risk score Mikey Bussing DC Brooke Bonito., et al., 2013) is: 34.5%   Values used to calculate the score:     Age: 32 years     Sex: Male     Is Non-Hispanic African American: No     Diabetic: Yes     Tobacco smoker: No     Systolic Blood Pressure: 076 mmHg     Is BP treated: No     HDL Cholesterol: 36 mg/dL     Total Cholesterol: 218 mg/dL  Assessment/ Plan: 68 y.o. male   1. Type 2 diabetes mellitus with diabetic polyneuropathy, with long-term current use of insulin ([facility]) A1c controlled.  Continue current regimen. Declined flu shot today.  2. Microalbuminuria due to type 2 diabetes mellitus ([place]) Recent urine sample with microalbuminuria.  We will start lisinopril 5 mg daily.  Check renal function in 1 week. - lisinopril (ZESTRIL) 5 MG tablet; Take 1 tablet (5 mg total) by mouth daily.  Dispense: 90 tablet; Refill: 0 - CMP14+EGFR; Future  3. Hyperlipidemia associated with type 2 diabetes mellitus ([facility]) ASCVD risk score is 34.5%.  We reviewed his high risk of stroke and heart attack.  He has not been on Lipitor and I wonder if this is why his LFTs have increased.  ?fatty liver.  I am starting him back on 20 mg nightly and will repeat this along with liver function tests again in 3 months. - atorvastatin (LIPITOR) 20 MG tablet; Take 1 tablet (20 mg total) by mouth daily.  Dispense: 90 tablet; Refill: 3 - CMP14+EGFR; Future  4. S/P unilateral BKA (below knee amputation), right ([place])  No orders of the defined types were placed in this encounter.  No orders of the defined types were placed in this encounter.   Janora Norlander, DO [place] [phone]

## 2019-03-27 NOTE — Patient Instructions (Addendum)
Your recent urine sample was POSITIVE for microalbumin.  This indicates early damage to the kidneys from sugar and blood pressure.  We discussed starting a medication to help protect your kidneys from further damage. I want you to return in 1 week for lab recheck of your kidneys.  Today we have added BACK lipitor 20mg .  You are STARTING Lisinopril 5mg  daily to protect the kidneys.  Microalbumin Test Why am I having this test? Albumin is a protein in the body that helps regulate how much fluid is in the blood. Normally, when the kidneys filter waste from the bloodstream, waste leaves the body through urine and important substances like albumin remain in the blood. If the kidneys are damaged, they may no longer be able to keep albumin in the blood. This causes small amounts of albumin (microalbumin, MA) in the urine. You may have this test:  If you have signs of kidney damage, such as foamy urine or swelling in the hands, feet, abdomen, or face.  To help evaluate kidney function after other kidney test results have been normal.  To monitor treatment for diabetes (diabetes mellitus), especially if your blood sugar (glucose) has been poorly controlled or you have signs of kidney damage. MA in the urine is a common complication of diabetes.  To monitor for complications caused by high blood pressure (hypertension).  To help diagnose cardiovascular disease or a heart attack. What is being tested? This test measures the amount of MA in your urine. You may have your creatinine level tested at the same time as MA. Creatinine is a waste product that the kidneys filter out of the blood. Testing creatinine levels provides more information about kidney function. What kind of sample is taken?  A urine sample is required for this test. You may be asked to collect urine samples at home over a period of 24 hours. How do I collect samples at home? When collecting a urine sample at home, make sure you:  Use  supplies and instructions that you received from the lab.  Collect urine only in the germ-free (sterile) cup that you received from the lab.  Do not let any toilet paper or stool (feces) get into the cup.  Refrigerate the sample until you can return it to the lab.  Return the sample(s) to the lab as instructed. How do I prepare for this test? Tell your health care provider about all medicines you are taking, including vitamins, herbs, eye drops, creams, and over-the-counter medicines. Follow instructions from your health care provider about changing or stopping your regular medicines. This is especially important if you are taking diabetes medicines or blood thinners. How are the results reported? Your test results will be reported as a value that indicates how much MA is in your urine. This may be given as:  Milligrams of MA per deciliter of urine (mg/dL).  Milligrams of MA per gram of creatinine (mg/g creatinine), if creatinine was also tested. Your health care provider will compare your results to normal ranges that were established after testing a large group of people (reference ranges). Reference ranges may vary among labs and hospitals. For this test, common reference ranges are:  MA only: 0-2 mg/dL.  MA and creatinine: ? Men: 0-17 mg/g creatinine. ? Women: 0-25 mg/g creatinine. What do the results mean? A result that is within the reference range means that you have a normal amount of MA in your urine. This may mean that:  Your kidneys are functioning normally.  Your diabetes or hypertension treatment is working effectively. Results that are higher than the reference range may mean that you have:  Poorly controlled diabetes. Your treatment plan may need to be adjusted.  Narrowing of the arteries due to plaque buildup (atherosclerosis).  Kidney damage due to hypertension or diabetes (nephropathy). Talk with your health care provider about what your results mean.  Questions to ask your health care provider Ask your health care provider, or the department that is doing the test:  When will my results be ready?  How will I get my results?  What are my treatment options?  What other tests do I need?  What are my next steps? Summary  Albumin is a protein that helps regulate how much fluid is in your blood.  If your kidneys are damaged, they may no longer be able to keep albumin in your blood. This causes small amounts of albumin (microalbumin, MA) in your urine.  High MA levels may indicate kidney damage from diabetes or hypertension (nephropathy).  Talk with your health care provider about what your results mean. This information is not intended to replace advice given to you by your health care provider. Make sure you discuss any questions you have with your health care provider. Document Released: 08/14/2004 Document Revised: 06/24/2017 Document Reviewed: 03/30/2017 Elsevier Patient Education  2020 [employer].

## 2019-04-03 ENCOUNTER — Other Ambulatory Visit: Payer: Medicare HMO

## 2019-04-03 ENCOUNTER — Other Ambulatory Visit: Payer: Self-pay

## 2019-04-03 DIAGNOSIS — E1129 Type 2 diabetes mellitus with other diabetic kidney complication: Secondary | ICD-10-CM

## 2019-04-03 DIAGNOSIS — E785 Hyperlipidemia, unspecified: Secondary | ICD-10-CM | POA: Diagnosis not present

## 2019-04-03 DIAGNOSIS — E1169 Type 2 diabetes mellitus with other specified complication: Secondary | ICD-10-CM

## 2019-04-03 DIAGNOSIS — R809 Proteinuria, unspecified: Secondary | ICD-10-CM | POA: Diagnosis not present

## 2019-04-04 ENCOUNTER — Other Ambulatory Visit: Payer: Self-pay | Admitting: Family Medicine

## 2019-04-04 DIAGNOSIS — R7989 Other specified abnormal findings of blood chemistry: Secondary | ICD-10-CM

## 2019-04-04 LAB — CMP14+EGFR
ALT: 66 IU/L — ABNORMAL HIGH (ref 0–44)
AST: 34 IU/L (ref 0–40)
Albumin/Globulin Ratio: 2.4 — ABNORMAL HIGH (ref 1.2–2.2)
Albumin: 4.5 g/dL (ref 3.8–4.8)
Alkaline Phosphatase: 39 IU/L (ref 39–117)
BUN/Creatinine Ratio: 14 (ref 10–24)
BUN: 19 mg/dL (ref 8–27)
Bilirubin Total: 0.5 mg/dL (ref 0.0–1.2)
CO2: 24 mmol/L (ref 20–29)
Calcium: 9.7 mg/dL (ref 8.6–10.2)
Chloride: 103 mmol/L (ref 96–106)
Creatinine, Ser: 1.34 mg/dL — ABNORMAL HIGH (ref 0.76–1.27)
GFR calc Af Amer: 63 mL/min/{1.73_m2} (ref >59)
GFR calc non Af Amer: 54 mL/min/{1.73_m2} — ABNORMAL LOW (ref >59)
Globulin, Total: 1.9 g/dL (ref 1.5–4.5)
Glucose: 161 mg/dL — ABNORMAL HIGH (ref 65–99)
Potassium: 4.6 mmol/L (ref 3.5–5.2)
Sodium: 141 mmol/L (ref 134–144)
Total Protein: 6.4 g/dL (ref 6.0–8.5)

## 2019-04-06 ENCOUNTER — Encounter: Payer: Self-pay | Admitting: *Deleted

## 2019-04-17 ENCOUNTER — Other Ambulatory Visit: Payer: Self-pay

## 2019-04-18 ENCOUNTER — Other Ambulatory Visit: Payer: Medicare HMO

## 2019-04-18 DIAGNOSIS — R7989 Other specified abnormal findings of blood chemistry: Secondary | ICD-10-CM | POA: Diagnosis not present

## 2019-04-19 LAB — BASIC METABOLIC PANEL
BUN/Creatinine Ratio: 20 (ref 10–24)
BUN: 24 mg/dL (ref 8–27)
CO2: 22 mmol/L (ref 20–29)
Calcium: 9.9 mg/dL (ref 8.6–10.2)
Chloride: 103 mmol/L (ref 96–106)
Creatinine, Ser: 1.21 mg/dL (ref 0.76–1.27)
GFR calc Af Amer: 71 mL/min/{1.73_m2} (ref >59)
GFR calc non Af Amer: 62 mL/min/{1.73_m2} (ref >59)
Glucose: 179 mg/dL — ABNORMAL HIGH (ref 65–99)
Potassium: 4.8 mmol/L (ref 3.5–5.2)
Sodium: 140 mmol/L (ref 134–144)

## 2019-04-23 ENCOUNTER — Encounter: Payer: Self-pay | Admitting: *Deleted

## 2019-04-24 DIAGNOSIS — M79676 Pain in unspecified toe(s): Secondary | ICD-10-CM | POA: Diagnosis not present

## 2019-04-24 DIAGNOSIS — B351 Tinea unguium: Secondary | ICD-10-CM | POA: Diagnosis not present

## 2019-04-24 DIAGNOSIS — L84 Corns and callosities: Secondary | ICD-10-CM | POA: Diagnosis not present

## 2019-04-24 DIAGNOSIS — E1151 Type 2 diabetes mellitus with diabetic peripheral angiopathy without gangrene: Secondary | ICD-10-CM | POA: Diagnosis not present

## 2019-05-08 ENCOUNTER — Encounter: Payer: Self-pay | Admitting: Family

## 2019-05-08 ENCOUNTER — Ambulatory Visit (INDEPENDENT_AMBULATORY_CARE_PROVIDER_SITE_OTHER): Payer: Medicare HMO | Admitting: Family

## 2019-05-08 VITALS — Ht 74.0 in | Wt 287.0 lb

## 2019-05-08 DIAGNOSIS — Z89511 Acquired absence of right leg below knee: Secondary | ICD-10-CM | POA: Diagnosis not present

## 2019-05-08 NOTE — Progress Notes (Signed)
Office Visit Note   Patient: Rick Lane           Date of Birth: [birth year]           MRN: [id number] Visit Date: 05/08/2019              Requested by: Janora Norlander, DO [place],  [place] [id number] PCP: Janora Norlander, DO  Chief Complaint  Patient presents with  . Right Leg - Follow-up    Prosthetic Re-eval      HPI: The patient is a 68 year old gentleman who is seen today for evaluation of his right below the knee amputation.  He recently was fitted for a new prosthesis and has a new ankle.  He feels that the socket is ill fitting.  He was over at [facility] last week and feels that the liner is loose within the socket.  He has recently developed a new ulcer to his residual limb he has been doing dry dressing changes.  There is no drainage she denies fevers or chills.  Assessment & Plan: Visit Diagnoses: No diagnosis found.  Plan: Order for new prosthesis faxed to [facility] he may be able to revise the current socket with modifications.  Follow-Up Instructions: Return in about 3 weeks (around 05/29/2019), or if symptoms worsen or fail to improve.   Ortho Exam  Patient is alert, oriented, no adenopathy, well-dressed, normal affect, normal respiratory effort. On examination of right residual limb there is a small fissure over distal tibia from end bearing. This is is 5 mm long, 2 mm wide. Bleeding granulation. No surrounding erythema. No odor. No drainage.   Imaging: No results found. No images are attached to the encounter.  Labs: Lab Results  Component Value Date   HGBA1C 6.9 03/23/2019   HGBA1C 7.6 (H) 09/25/2018   HGBA1C 8.0 (H) 07/10/2018     Lab Results  Component Value Date   ALBUMIN 4.5 04/03/2019   ALBUMIN 4.7 03/23/2019   ALBUMIN 4.3 11/09/2017    No results found for: MG Lab Results  Component Value Date   VD25OH 37.4 04/28/2017    No results found for: PREALBUMIN CBC EXTENDED Latest Ref Rng & Units 04/28/2017 11/10/2016 06/29/2016   WBC 3.4 - 10.8 x10E3/uL 4.9 6.3 6.4  RBC 4.14 - 5.80 x10E6/uL 4.61 4.63 4.90  HGB 13.0 - 17.7 g/dL 13.4 13.6 13.3  HCT 37.5 - 51.0 % 41.4 41.3 40.3  PLT 150 - 379 x10E3/uL 184 192 203  NEUTROABS 1.4 - 7.0 x10E3/uL 3.1 - 4.1  LYMPHSABS 0.7 - 3.1 x10E3/uL 1.1 - 1.5     Body mass index is 36.85 kg/m.  Orders:  No orders of the defined types were placed in this encounter.  No orders of the defined types were placed in this encounter.    Procedures: No procedures performed  Clinical Data: No additional findings.  ROS:  All other systems negative, except as noted in the HPI. Review of Systems  Objective: Vital Signs: Ht 6\' 2"  (1.88 m)   Wt 287 lb (130.2 kg)   BMI 36.85 kg/m   Specialty Comments:  No specialty comments available.  PMFS History: Patient Active Problem List   Diagnosis Date Noted  . Microalbuminuria due to type 2 diabetes mellitus ([place]) 03/27/2019  . Diabetic ulcer of left midfoot associated with type 2 diabetes mellitus, with fat layer exposed ([place]) 10/10/2018  . Influenza vaccine refused 05/29/2018  . Chest discomfort 11/27/2016  . DOE (dyspnea on exertion)  11/25/2016  . Below knee amputation status 03/05/2016  . S/P unilateral BKA (below knee amputation), right ([place]) 12/12/2015  . Healthcare maintenance 11/14/2015  . Hyperlipidemia associated with type 2 diabetes mellitus ([place]) 11/14/2015  . Diabetes with retinopathy ([place]) 08/11/2015  . Depression 08/11/2015  . Vitamin D deficiency 08/11/2015  . Asthma 08/11/2015   Past Medical History:  Diagnosis Date  . Asthma    ass a child  . Cataract   . 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 Depression   . Diabetes mellitus without complication ([place])    Type II  . Hyperlipidemia   . Neuropathy   . Neuropathy   . Sleep apnea     Family History  Problem Relation Age of Onset  . Heart disease Father   . Diabetes Sister   . Diabetes Brother   . Heart attack  Paternal Grandfather     Past Surgical History:  Procedure Laterality Date  . AMPUTATION Right 12/12/2015   Procedure: Right Transmetatarsal Amputation With [facility] Placement;  Surgeon: [name], MD;  Location: [place];  Service: Orthopedics;  Laterality: Right;  . AMPUTATION Right 03/05/2016   Procedure: RIGHT BELOW KNEE AMPUTATION;  Surgeon: [name], MD;  Location: [place];  Service: Orthopedics;  Laterality: Right;  . CATARACT EXTRACTION W/PHACO Right 10/02/2018   Procedure: CATARACT EXTRACTION PHACO AND INTRAOCULAR LENS PLACEMENT ([place]);  Surgeon: Baruch Goldmann, MD;  Location: [facility];  Service: Ophthalmology;  Laterality: Right;  CDE: 130.53  . COLONOSCOPY    . EYE SURGERY Left    Cataract  . EYE SURGERY Right    r  . ORBITAL RECONSTRUCTION Right   . STUMP REVISION Right 11/10/2016   Procedure: REVISION RIGHT BELOW KNEE AMPUTATION;  Surgeon: [name], MD;  Location: [place];  Service: Orthopedics;  Laterality: Right;  . toe removal Right 2012,2014   Social History   Occupational History  . Occupation: Scientist, research (physical sciences)    Comment: [employer]  Tobacco Use  . Smoking status: Never Smoker  . Smokeless tobacco: Never Used  Substance and Sexual Activity  . Alcohol use: No  . Drug use: No  . Sexual activity: Yes    Birth control/protection: None

## 2019-06-25 ENCOUNTER — Other Ambulatory Visit: Payer: Self-pay | Admitting: Family Medicine

## 2019-06-25 DIAGNOSIS — E1129 Type 2 diabetes mellitus with other diabetic kidney complication: Secondary | ICD-10-CM

## 2019-06-25 DIAGNOSIS — R809 Proteinuria, unspecified: Secondary | ICD-10-CM

## 2019-06-27 ENCOUNTER — Encounter: Payer: Self-pay | Admitting: Family Medicine

## 2019-06-27 ENCOUNTER — Ambulatory Visit (INDEPENDENT_AMBULATORY_CARE_PROVIDER_SITE_OTHER): Payer: Medicare HMO | Admitting: Family Medicine

## 2019-06-27 ENCOUNTER — Other Ambulatory Visit: Payer: Self-pay

## 2019-06-27 VITALS — BP 134/68 | HR 87 | Temp 98.6°F | Ht 74.0 in | Wt 286.0 lb

## 2019-06-27 DIAGNOSIS — E1129 Type 2 diabetes mellitus with other diabetic kidney complication: Secondary | ICD-10-CM | POA: Diagnosis not present

## 2019-06-27 DIAGNOSIS — E1169 Type 2 diabetes mellitus with other specified complication: Secondary | ICD-10-CM

## 2019-06-27 DIAGNOSIS — Z794 Long term (current) use of insulin: Secondary | ICD-10-CM | POA: Diagnosis not present

## 2019-06-27 DIAGNOSIS — R809 Proteinuria, unspecified: Secondary | ICD-10-CM | POA: Diagnosis not present

## 2019-06-27 DIAGNOSIS — R69 Illness, unspecified: Secondary | ICD-10-CM | POA: Diagnosis not present

## 2019-06-27 DIAGNOSIS — E1142 Type 2 diabetes mellitus with diabetic polyneuropathy: Secondary | ICD-10-CM | POA: Diagnosis not present

## 2019-06-27 DIAGNOSIS — R4589 Other symptoms and signs involving emotional state: Secondary | ICD-10-CM | POA: Diagnosis not present

## 2019-06-27 DIAGNOSIS — E785 Hyperlipidemia, unspecified: Secondary | ICD-10-CM

## 2019-06-27 LAB — BAYER DCA HB A1C WAIVED: HB A1C (BAYER DCA - WAIVED): 6.7 % (ref <7.0)

## 2019-06-27 MED ORDER — LISINOPRIL 5 MG PO TABS: 5.0000 mg | ORAL_TABLET | Freq: Every day | ORAL | 3 refills | Status: DC

## 2019-06-27 NOTE — Progress Notes (Signed)
Subjective: CC: DM2, HLD PCP: Janora Norlander, DO GYB:WLSLHTD Goree is a 68 y.o. male presenting to clinic today for:  1. Type 2 Diabetes w/ hyperlipidemia Patient reports maintaining a fairly low-carb diet.  He did cheat during Thanksgiving and have stuffing.Marland Kitchen  He is injecting 50units of Tresiba twice daily and 10 units of Novolin R 10-12u q. Meal.  He is compliant with the Lipitor 20 mg daily was started 3 months ago.  No intolerance to the medicine.  He is compliant with lisinopril but notes that he has been out of it for the last 2 days due to lack of refills.  He has not yet check back with his pharmacy to see if this is been renewed.  Last eye exam: Retinopathy noted that is stable with my eye doctor in [place] Last A1c:  Lab Results  Component Value Date   HGBA1C 6.9 03/23/2019   Nephropathy screen indicated?:  8/28 w/ moderate microalbuminuria > ACE-I started Last flu, zoster and/or pneumovax: Tdap, flu declined  ROS: No chest pain, shortness of breath.  No myalgia.  Patient is tolerating the medication without difficulty.  2.  Depressed mood Patient does admit to a slightly depressed mood.  He occasionally feels blue.  He feels that maybe he just needs to get a different hobby.  He reads quite a bit.  He sometimes engages in physical activity.  Not sure that he wants to speak to a counselor or start medication yet but wanted to mention this.  No SI or HI.  ROS: Per HPI  Allergies  Allergen Reactions  . Bactrim [Sulfamethoxazole-Trimethoprim] Itching  . Gentamicin Other (See Comments)    Unknown   Past Medical History:  Diagnosis Date  . Asthma    ass a child  . Cataract   . 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 Depression   . Diabetes mellitus without complication ([place])    Type II  . Hyperlipidemia   . Neuropathy   . Neuropathy   . Sleep apnea     Current Outpatient Medications:  .  atorvastatin (LIPITOR) 20 MG  tablet, Take 1 tablet (20 mg total) by mouth daily., Disp: 90 tablet, Rfl: 3 .  Insulin Degludec (TRESIBA FLEXTOUCH) 200 UNIT/ML SOPN, Inject 50 Units into the skin 2 (two) times daily., Disp: 18 mL, Rfl: 11 .  insulin regular (NOVOLIN R,HUMULIN R) 100 units/mL injection, Inject 12 units with 2 largest meals of the day and 10 units with smallest meal of the day, Disp: 10 mL, Rfl: 11 .  Insulin Syringe-Needle U-100 (RELION INSULIN SYR 0.3CC/30G) 30G X 5/16" 0.3 ML MISC, 1 Syringe by Does not apply route 3 (three) times daily. Dx E11.42, Disp: 100 each, Rfl: 11 .  lisinopril (ZESTRIL) 5 MG tablet, Take 1 tablet by mouth once daily, Disp: 90 tablet, Rfl: 0 Social History   Socioeconomic History  . Marital status: Married    Spouse name: Not on file  . Number of children: 0  . Years of education: 78  . Highest education level: Not on file  Occupational History  . Occupation: Scientist, research (physical sciences)    Comment: [employer]  . Financial resource strain: Not on file  . Food insecurity    Worry: Not on file    Inability: Not on file  . Transportation needs    Medical: Not on file    Non-medical: Not on file  Tobacco Use  . Smoking status: Never Smoker  .  Smokeless tobacco: Never Used  Substance and Sexual Activity  . Alcohol use: No  . Drug use: No  . Sexual activity: Yes    Birth control/protection: None  Lifestyle  . Physical activity    Days per week: Not on file    Minutes per session: Not on file  . Stress: Not on file  Relationships  . Social Herbalist on phone: Not on file    Gets together: Not on file    Attends religious service: Not on file    Active member of club or organization: Not on file    Attends meetings of clubs or organizations: Not on file    Relationship status: Not on file  . Intimate partner violence    Fear of current or ex partner: Not on file    Emotionally abused: Not on file    Physically abused: Not on file    Forced sexual activity:  Not on file  Other Topics Concern  . Not on file  Social History Narrative  . Not on file   Family History  Problem Relation Age of Onset  . Heart disease Father   . Diabetes Sister   . Diabetes Brother   . Heart attack Paternal Grandfather     Objective: Office vital signs reviewed. BP 134/68   Pulse 87   Temp 98.6 F (37 C) (Temporal)   Ht 6' 2"  (1.88 m)   Wt 286 lb (129.7 kg)   SpO2 97%   BMI 36.72 kg/m   Physical Examination:  General: Awake, alert, well nourished, No acute distress HEENT: Normal; sclera white, MMM; no carotid bruits Cardio: regular rate and rhythm, S1S2 heard, no murmurs appreciated Pulm: clear to auscultation bilaterally, no wheezes, rhonchi or rales; normal work of breathing on room air Extremities: Surgically absent right lower extremity; using prosthesis.   Psych: Mood stable, speech normal, affect appropriate, pleasant and interactive.  Good eye contact.  Does not appear to be responding to internal stimuli. Depression screen [facility] 2/9 06/27/2019 03/27/2019 10/10/2018  Decreased Interest 1 0 0  Down, Depressed, Hopeless 2 0 0  PHQ - 2 Score 3 0 0  Altered sleeping 0 0 0  Tired, decreased energy 1 0 0  Change in appetite 0 0 0  Feeling bad or failure about yourself  1 0 0  Trouble concentrating 1 0 0  Moving slowly or fidgety/restless 0 0 0  Suicidal thoughts 0 0 0  PHQ-9 Score 6 0 0  Difficult doing work/chores Somewhat difficult - -   No flowsheet data found.  Assessment/ Plan: 68 y.o. male   1. Type 2 diabetes mellitus with diabetic polyneuropathy, with long-term current use of insulin (HCC) Under excellent control with A1c of 6.7 today.  No changes made to medications. - hgba1c  2. Hyperlipidemia associated with type 2 diabetes mellitus ([place]) We will check direct LDL and liver function tests given the reinitiation of statin. - CMP14+EGFR - LDL Cholesterol, Direct  3. Microalbuminuria due to type 2 diabetes mellitus ([place]) Continue  ACE inhibitor for renal protection.  Blood pressure controlled. - lisinopril (ZESTRIL) 5 MG tablet; Take 1 tablet (5 mg total) by mouth daily.  Dispense: 90 tablet; Refill: 3  4. Depressed mood We did discuss possibility of medication versus referral to counseling.  At this time he does not wish to pursue either.  I encouraged him to contact me should he change his mind.  Discussed self-care, physical exercise.   Orders  Placed This Encounter  Procedures  . hgba1c   No orders of the defined types were placed in this encounter.   Janora Norlander, DO [place] [phone]

## 2019-06-28 LAB — CMP14+EGFR
ALT: 46 IU/L — ABNORMAL HIGH (ref 0–44)
AST: 27 IU/L (ref 0–40)
Albumin/Globulin Ratio: 2.3 — ABNORMAL HIGH (ref 1.2–2.2)
Albumin: 4.6 g/dL (ref 3.8–4.8)
Alkaline Phosphatase: 56 IU/L (ref 39–117)
BUN/Creatinine Ratio: 20 (ref 10–24)
BUN: 20 mg/dL (ref 8–27)
Bilirubin Total: 0.5 mg/dL (ref 0.0–1.2)
CO2: 24 mmol/L (ref 20–29)
Calcium: 10 mg/dL (ref 8.6–10.2)
Chloride: 104 mmol/L (ref 96–106)
Creatinine, Ser: 0.98 mg/dL (ref 0.76–1.27)
GFR calc Af Amer: 92 mL/min/{1.73_m2} (ref >59)
GFR calc non Af Amer: 79 mL/min/{1.73_m2} (ref >59)
Globulin, Total: 2 g/dL (ref 1.5–4.5)
Glucose: 184 mg/dL — ABNORMAL HIGH (ref 65–99)
Potassium: 4.6 mmol/L (ref 3.5–5.2)
Sodium: 143 mmol/L (ref 134–144)
Total Protein: 6.6 g/dL (ref 6.0–8.5)

## 2019-06-28 LAB — LDL CHOLESTEROL, DIRECT: LDL Direct: 84 mg/dL (ref 0–99)

## 2019-07-09 ENCOUNTER — Encounter: Payer: Self-pay | Admitting: *Deleted

## 2019-07-21 ENCOUNTER — Other Ambulatory Visit: Payer: Self-pay | Admitting: Family Medicine

## 2019-08-21 ENCOUNTER — Other Ambulatory Visit: Payer: Self-pay | Admitting: Family Medicine

## 2019-09-20 ENCOUNTER — Other Ambulatory Visit: Payer: Self-pay | Admitting: Family Medicine

## 2019-09-20 MED ORDER — TRESIBA FLEXTOUCH 200 UNIT/ML ~~LOC~~ SOPN: 48.0000 [IU] | PEN_INJECTOR | Freq: Two times a day (BID) | SUBCUTANEOUS | 0 refills | Status: DC

## 2019-09-20 NOTE — Telephone Encounter (Signed)
Refill failed. resent °

## 2019-09-20 NOTE — Addendum Note (Signed)
Addended by: Antonietta Barcelona D on: 09/20/2019 11:37 AM   Modules accepted: Orders

## 2019-09-25 ENCOUNTER — Ambulatory Visit (INDEPENDENT_AMBULATORY_CARE_PROVIDER_SITE_OTHER): Payer: Medicare HMO | Admitting: Family Medicine

## 2019-09-25 ENCOUNTER — Other Ambulatory Visit: Payer: Self-pay

## 2019-09-25 VITALS — BP 126/69 | HR 90 | Temp 98.6°F | Ht 74.0 in | Wt 288.0 lb

## 2019-09-25 DIAGNOSIS — E1169 Type 2 diabetes mellitus with other specified complication: Secondary | ICD-10-CM

## 2019-09-25 DIAGNOSIS — E785 Hyperlipidemia, unspecified: Secondary | ICD-10-CM

## 2019-09-25 DIAGNOSIS — R809 Proteinuria, unspecified: Secondary | ICD-10-CM | POA: Diagnosis not present

## 2019-09-25 DIAGNOSIS — E1165 Type 2 diabetes mellitus with hyperglycemia: Secondary | ICD-10-CM

## 2019-09-25 DIAGNOSIS — R7989 Other specified abnormal findings of blood chemistry: Secondary | ICD-10-CM

## 2019-09-25 DIAGNOSIS — E1129 Type 2 diabetes mellitus with other diabetic kidney complication: Secondary | ICD-10-CM

## 2019-09-25 DIAGNOSIS — E1142 Type 2 diabetes mellitus with diabetic polyneuropathy: Secondary | ICD-10-CM | POA: Diagnosis not present

## 2019-09-25 DIAGNOSIS — Z794 Long term (current) use of insulin: Secondary | ICD-10-CM | POA: Diagnosis not present

## 2019-09-25 LAB — BAYER DCA HB A1C WAIVED: HB A1C (BAYER DCA - WAIVED): 7.8 % — ABNORMAL HIGH (ref <7.0)

## 2019-09-25 MED ORDER — LISINOPRIL 5 MG PO TABS: 2.5000 mg | ORAL_TABLET | Freq: Every day | ORAL | 3 refills | Status: DC

## 2019-09-25 NOTE — Patient Instructions (Signed)
Surprisingly, your sugar has jumped quite a bit since last visit.  Please check your sugar each morning and 2 hours after each meal so that we can figure out where we need to make the insulin change.  ALSO, make sure you are following a low carbohydrate diet.  We will reconvene in 2 week to review these sugars.

## 2019-09-25 NOTE — Progress Notes (Signed)
Subjective: CC: DM2, HLD PCP: Janora Norlander, DO LY:[id number] Rick Lane is a 69 y.o. male presenting to clinic today for:  1. Type 2 Diabetes w/ hyperlipidemia Patient reports that his wife does most of the cooking and he may not have been as restricted with carbohydrates as he has been previously.  He is injecting 50units of Tresiba twice daily and 10 units of Novolin R 10-12u q. Meal; Lipitor 20 mg daily.  Ran out of lisinopril and has not been on.  He only checks his blood sugars in the morning and they typically run around 130s.  Last eye exam: UTD Last A1c:  Lab Results  Component Value Date   HGBA1C 6.7 06/27/2019   Nephropathy screen indicated?: on ACE-I due to microalbuminuria  Last flu, zoster and/or pneumovax: Tdap, flu declined again  There is no immunization history on file for this patient.  ROS: No chest pain, shortness of breath.  No myalgia.  Patient is tolerating the medication without difficulty.  ROS: Per HPI  Allergies  Allergen Reactions  . Bactrim [Sulfamethoxazole-Trimethoprim] Itching  . Gentamicin Other (See Comments)    Unknown   Past Medical History:  Diagnosis Date  . Asthma    ass a child  . Cataract   . 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 Depression   . Diabetes mellitus without complication ([place])    Type II  . Hyperlipidemia   . Neuropathy   . Neuropathy   . Sleep apnea     Current Outpatient Medications:  .  atorvastatin (LIPITOR) 20 MG tablet, Take 1 tablet (20 mg total) by mouth daily., Disp: 90 tablet, Rfl: 3 .  Insulin Degludec (TRESIBA FLEXTOUCH) 200 UNIT/ML SOPN, Inject 48 Units into the skin 2 (two) times daily., Disp: 18 mL, Rfl: 0 .  insulin regular (NOVOLIN R,HUMULIN R) 100 units/mL injection, Inject 12 units with 2 largest meals of the day and 10 units with smallest meal of the day, Disp: 10 mL, Rfl: 11 .  Insulin Syringe-Needle U-100 (RELION INSULIN SYR 0.3CC/30G) 30G X 5/16" 0.3  ML MISC, 1 Syringe by Does not apply route 3 (three) times daily. Dx E11.42, Disp: 100 each, Rfl: 11 .  lisinopril (ZESTRIL) 5 MG tablet, Take 1 tablet (5 mg total) by mouth daily., Disp: 90 tablet, Rfl: 3 Social History   Socioeconomic History  . Marital status: Married    Spouse name: Not on file  . Number of children: 0  . Years of education: 63  . Highest education level: Not on file  Occupational History  . Occupation: Scientist, research (physical sciences)    Comment: [employer]  Tobacco Use  . Smoking status: Never Smoker  . Smokeless tobacco: Never Used  Substance and Sexual Activity  . Alcohol use: No  . Drug use: No  . Sexual activity: Not Currently    Birth control/protection: None  Other Topics Concern  . Not on file  Social History Narrative  . Not on file   Social Determinants of Health   Financial Resource Strain:   . Difficulty of Paying Living Expenses: Not on file  Food Insecurity:   . Worried About Charity fundraiser in the Last Year: Not on file  . Ran Out of Food in the Last Year: Not on file  Transportation Needs:   . Lack of Transportation (Medical): Not on file  . Lack of Transportation (Non-Medical): Not on file  Physical Activity:   . Days of Exercise per Week:  Not on file  . Minutes of Exercise per Session: Not on file  Stress:   . Feeling of Stress : Not on file  Social Connections:   . Frequency of Communication with Friends and Family: Not on file  . Frequency of Social Gatherings with Friends and Family: Not on file  . Attends Religious Services: Not on file  . Active Member of Clubs or Organizations: Not on file  . Attends Archivist Meetings: Not on file  . Marital Status: Not on file  Intimate Partner Violence:   . Fear of Current or Ex-Partner: Not on file  . Emotionally Abused: Not on file  . Physically Abused: Not on file  . Sexually Abused: Not on file   Family History  Problem Relation Age of Onset  . Heart disease Father   . Diabetes  Sister   . Diabetes Brother   . Heart attack Paternal Grandfather     Objective: Office vital signs reviewed. There were no vitals taken for this visit.  Physical Examination:  General: Awake, alert, well nourished, No acute distress HEENT: Normal; sclera white, MMM  Cardio: regular rate and rhythm, S1S2 heard, no murmurs appreciated Pulm: clear to auscultation bilaterally, no wheezes, rhonchi or rales; normal work of breathing on room air Extremities: Surgically absent right lower extremity; using prosthesis and cane.   Psych: Mood stable, speech normal, affect appropriate, pleasant and interactive. Depression screen [facility] 2/9 09/25/2019 06/27/2019 03/27/2019  Decreased Interest 0 1 0  Down, Depressed, Hopeless 0 2 0  PHQ - 2 Score 0 3 0  Altered sleeping 0 0 0  Tired, decreased energy 0 1 0  Change in appetite 0 0 0  Feeling bad or failure about yourself  0 1 0  Trouble concentrating 0 1 0  Moving slowly or fidgety/restless 0 0 0  Suicidal thoughts 0 0 0  PHQ-9 Score 0 6 0  Difficult doing work/chores - Somewhat difficult -  Some recent data might be hidden   No flowsheet data found.  Assessment/ Plan: 69 y.o. male   1. Uncontrolled type 2 diabetes mellitus with hyperglycemia (HCC) A1c has shot up quite a bit since last visit.  His A1c was up to 7.8.  Difficult to tell where the high blood sugars are occurring as his fasting blood sugars seem to be in a reasonable range.  I have asked that he check his morning blood sugar and 2 hours postprandials and record.  We will reconvene in 2 weeks for review and increase insulin accordingly.  I have also reinforced a low-carb diet. - Bayer DCA Hb A1c Waived  2. Hyperlipidemia associated with type 2 diabetes mellitus ([place]) Continue statin  3. Microalbuminuria due to type 2 diabetes mellitus ([place]) I have renewed his lisinopril and reinforced the need for this for renal protection. - lisinopril (ZESTRIL) 5 MG tablet; Take 0.5 tablets (2.5  mg total) by mouth daily.  Dispense: 45 tablet; Refill: 3  4. Elevated liver function tests - Hepatic Function Panel    No orders of the defined types were placed in this encounter.  No orders of the defined types were placed in this encounter.   Janora Norlander, DO [place] [phone]

## 2019-09-26 LAB — HEPATIC FUNCTION PANEL
ALT: 54 IU/L — ABNORMAL HIGH (ref 0–44)
AST: 30 IU/L (ref 0–40)
Albumin: 4.2 g/dL (ref 3.8–4.8)
Alkaline Phosphatase: 48 IU/L (ref 39–117)
Bilirubin Total: 0.4 mg/dL (ref 0.0–1.2)
Bilirubin, Direct: 0.15 mg/dL (ref 0.00–0.40)
Total Protein: 6.4 g/dL (ref 6.0–8.5)

## 2019-09-28 ENCOUNTER — Other Ambulatory Visit: Payer: Self-pay | Admitting: Family Medicine

## 2019-09-28 DIAGNOSIS — R7989 Other specified abnormal findings of blood chemistry: Secondary | ICD-10-CM

## 2019-10-02 ENCOUNTER — Encounter: Payer: Self-pay | Admitting: Internal Medicine

## 2019-10-24 ENCOUNTER — Other Ambulatory Visit: Payer: Self-pay

## 2019-10-24 ENCOUNTER — Encounter: Payer: Self-pay | Admitting: Nurse Practitioner

## 2019-10-24 ENCOUNTER — Ambulatory Visit: Payer: Medicare HMO | Admitting: Nurse Practitioner

## 2019-10-24 VITALS — BP 152/80 | HR 91 | Temp 97.3°F | Ht 74.0 in | Wt 286.8 lb

## 2019-10-24 DIAGNOSIS — R7989 Other specified abnormal findings of blood chemistry: Secondary | ICD-10-CM | POA: Diagnosis not present

## 2019-10-24 DIAGNOSIS — Z0000 Encounter for general adult medical examination without abnormal findings: Secondary | ICD-10-CM | POA: Diagnosis not present

## 2019-10-24 MED ORDER — PEG 3350-KCL-NA BICARB-NACL 420 G PO SOLR: 4000.0000 mL | ORAL | 0 refills | Status: DC

## 2019-10-24 NOTE — Patient Instructions (Addendum)
Your health issues we discussed today were:   Elevated liver enzymes: 1. Have your labs drawn as you can 2. We will help schedule your ultrasound of your liver for you 3. Further recommendations will follow your results 4. Call us if you have any concerning or suspicious symptoms 5. ) And information on fatty liver disease below.  Need for colonoscopy: 1. We will schedule your colonoscopy for you 2. Further recommendations will be made after your colonoscopy  Overall I recommend:  1. Continue your other current medications 2. Return for follow-up in 3 months 3. Call us if you have any questions or concerns   ---------------------------------------------------------------  COVID-19 Vaccine Information can be found at: [email or web address].uk For questions related to vaccine distribution or appointments, please email [email or web address] or call [phone].   ---------------------------------------------------------------   At [facility] we value your feedback. You may receive a survey about your visit today. Please share your experience as we strive to create trusting relationships with our patients to provide genuine, compassionate, quality care.  We appreciate your understanding and patience as we review any laboratory studies, imaging, and other diagnostic tests that are ordered as we care for you. Our office policy is 5 business days for review of these results, and any emergent or urgent results are addressed in a timely manner for your best interest. If you do not hear from our office in 1 week, please contact us.   We also encourage the use of MyChart, which contains your medical information for your review as well. If you are not enrolled in this feature, an access code is on this after visit summary for your convenience. Thank you for allowing [place] to be involved in your care.  It was great to see you  today!  I hope you have a great day!!      Fatty Liver Disease  Fatty liver disease occurs when too much fat has built up in your liver cells. Fatty liver disease is also called hepatic steatosis or steatohepatitis. The liver removes harmful substances from your bloodstream and produces fluids that your body needs. It also helps your body use and store energy from the food you eat. In many cases, fatty liver disease does not cause symptoms or problems. It is often diagnosed when tests are being done for other reasons. However, over time, fatty liver can cause inflammation that may lead to more serious liver problems, such as scarring of the liver (cirrhosis) and liver failure. Fatty liver is associated with insulin resistance, increased body fat, high blood pressure (hypertension), and high cholesterol. These are features of metabolic syndrome and increase your risk for stroke, diabetes, and heart disease. What are the causes? This condition may be caused by:  Drinking too much alcohol.  Poor nutrition.  Obesity.  Cushing's syndrome.  Diabetes.  High cholesterol.  Certain drugs.  Poisons.  Some viral infections.  Pregnancy. What increases the risk? You are more likely to develop this condition if you:  Abuse alcohol.  Are overweight.  Have diabetes.  Have hepatitis.  Have a high triglyceride level.  Are pregnant. What are the signs or symptoms? Fatty liver disease often does not cause symptoms. If symptoms do develop, they can include:  Fatigue.  Weakness.  Weight loss.  Confusion.  Abdominal pain.  Nausea and vomiting.  Yellowing of your skin and the white parts of your eyes (jaundice).  Itchy skin. How is this diagnosed? This condition may be diagnosed by:  A physical exam and medical history.  Blood tests.  Imaging tests, such as an ultrasound, CT scan, or MRI.  A liver biopsy. A small sample of liver tissue is removed using a needle. The  sample is then looked at under a microscope. How is this treated? Fatty liver disease is often caused by other health conditions. Treatment for fatty liver may involve medicines and lifestyle changes to manage conditions such as:  Alcoholism.  High cholesterol.  Diabetes.  Being overweight or obese. Follow these instructions at home:   Do not drink alcohol. If you have trouble quitting, ask your health care provider how to safely quit with the help of medicine or a supervised program. This is important to keep your condition from getting worse.  Eat a healthy diet as told by your health care provider. Ask your health care provider about working with a diet and nutrition specialist (dietitian) to develop an eating plan.  Exercise regularly. This can help you lose weight and control your cholesterol and diabetes. Talk to your health care provider about an exercise plan and which activities are best for you.  Take over-the-counter and prescription medicines only as told by your health care provider.  Keep all follow-up visits as told by your health care provider. This is important. Contact a health care provider if: You have trouble controlling your:  Blood sugar. This is especially important if you have diabetes.  Cholesterol.  Drinking of alcohol. Get help right away if:  You have abdominal pain.  You have jaundice.  You have nausea and vomiting.  You vomit blood or material that looks like coffee grounds.  You have stools that are black, tar-like, or bloody. Summary  Fatty liver disease develops when too much fat builds up in the cells of your liver.  Fatty liver disease often causes no symptoms or problems. However, over time, fatty liver can cause inflammation that may lead to more serious liver problems, such as scarring of the liver (cirrhosis).  You are more likely to develop this condition if you abuse alcohol, are pregnant, are overweight, have diabetes, have  hepatitis, or have high triglyceride levels.  Contact your health care provider if you have trouble controlling your weight, blood sugar, cholesterol, or drinking of alcohol. This information is not intended to replace advice given to you by your health care provider. Make sure you discuss any questions you have with your health care provider. Document Revised: 06/24/2017 Document Reviewed: 04/20/2017 Elsevier Patient Education  2020 [place].    Nonalcoholic Fatty Liver Disease Diet, Adult Nonalcoholic fatty liver disease is a condition that causes fat to build up in and around the liver. The disease makes it harder for the liver to work the way that it should. Following a healthy diet can help to keep nonalcoholic fatty liver disease under control. It can also help to prevent or improve conditions that are associated with the disease, such as heart disease, diabetes, high blood pressure, and abnormal cholesterol levels. Along with regular exercise, this diet:  Promotes weight loss.  Helps to control blood sugar levels.  Helps to improve the way that the body uses insulin. What are tips for following this plan? Reading food labels Always check food labels for:  The amount of saturated fat in a food. You should limit your intake of saturated fat. Saturated fat is found in foods that come from animals, including meat and dairy products such as butter, cheese, and whole milk.  The amount of fiber in a food. You should choose  high-fiber foods such as fruits, vegetables, and whole grains. Try to get 25-30 grams (g) of fiber a day.  Cooking  When cooking, use heart-healthy oils that are high in monounsaturated fats. These include olive oil, canola oil, and avocado oil.  Limit frying or deep-frying foods. Cook foods using healthy methods such as baking, boiling, steaming, and grilling instead. Meal planning  You may want to keep track of how many calories you take in. Eating the right  amount of calories will help you achieve a healthy weight. Meeting with a registered dietitian can help you get started.  Limit how often you eat takeout and fast food. These foods are usually very high in fat, salt, and sugar.  Use the glycemic index (GI) to plan your meals. The index tells you how quickly a food will raise your blood sugar. Choose low-GI foods (GI less than 55). These foods take a longer time to raise blood sugar. A registered dietitian can help you identify foods lower on the GI scale. Lifestyle  You may want to follow a Mediterranean diet. This diet includes a lot of vegetables, lean meats or fish, whole grains, fruits, and healthy oils and fats. What foods can I eat?  Fruits Bananas. Apples. Oranges. Grapes. Papaya. Mango. Pomegranate. Kiwi. Grapefruit. Cherries. Vegetables Lettuce. Spinach. Peas. Beets. Cauliflower. Cabbage. Broccoli. Carrots. Tomatoes. Squash. Eggplant. Herbs. Peppers. Onions. Cucumbers. Brussels sprouts. Yams and sweet potatoes. Beans. Lentils. Grains Whole wheat or whole-grain foods, including breads, crackers, cereals, and pasta. Stone-ground whole wheat. Unsweetened oatmeal. Bulgur. Barley. Quinoa. Brown or wild rice. Corn or whole wheat flour tortillas. Meats and other proteins Lean meats. Poultry. Tofu. Seafood and shellfish. Dairy Low-fat or fat-free dairy products, such as yogurt, cottage cheese, or cheese. Beverages Water. Sugar-free drinks. Tea. Coffee. Low-fat or skim milk. Milk alternatives, such as soy or almond milk. Real fruit juice. Fats and oils Avocado. Canola or olive oil. Nuts and nut butters. Seeds. Seasonings and condiments Mustard. Relish. Low-fat, low-sugar ketchup and barbecue sauce. Low-fat or fat-free mayonnaise. Sweets and desserts Sugar-free sweets. The items listed above may not be a complete list of foods and beverages you can eat. Contact a dietitian for more information. What foods should I limit or avoid? Meats  and other proteins Limit red meat to 1-2 times a week. Dairy [employer]. Fats and oils Palm oil and coconut oil. Fried foods. Other foods Processed foods. Foods that contain a lot of salt or sodium. Sweets and desserts Sweets that contain sugar. Beverages Sweetened drinks, such as sweet tea, milkshakes, iced sweet drinks, and sodas. Alcohol. The items listed above may not be a complete list of foods and beverages you should avoid. Contact a dietitian for more information. Where to find more information The [employer] of Diabetes and Digestive and Kidney Diseases: AmenCredit.is Summary  Nonalcoholic fatty liver disease is a condition that causes fat to build up in and around the liver.  Following a healthy diet can help to keep nonalcoholic fatty liver disease under control. Your diet should be rich in fruits, vegetables, whole grains, and lean proteins.  Limit your intake of saturated fat. Saturated fat is found in foods that come from animals, including meat and dairy products such as butter, cheese, and whole milk.  This diet promotes weight loss, helps to control blood sugar levels, and helps to improve the way that the body uses insulin. This information is not intended to replace advice given to you by your health care provider. Make sure  you discuss any questions you have with your health care provider. Document Revised: 11/03/2018 Document Reviewed: 08/03/2018 Elsevier Patient Education  [place].

## 2019-10-24 NOTE — Progress Notes (Addendum)
Primary Care Physician:  Janora Norlander, DO Primary Gastroenterologist:  Dr. Gala Romney  Chief Complaint  Patient presents with  . elevated LFT  . Consult    TCS last done 15-20 years ago    HPI:   Rick Lane is a 69 y.o. male who presents on referral from primary care for elevated LFTs.  Reviewed information associated with referral including office visit dated 09/25/2019 for elevated LFTs, diabetes, hyperlipidemia, and others.  At that time noted morning time blood sugars typically in the 130s.  Hyperlipidemia on statin.  Previous elevated LFTs and recommended HFP.  Labs were drawn 09/25/2019 which found mild persistent elevated LFTs with ALT at 54 (ULN is 44).  AST normal at 30, alk phos normal at 48, bilirubin normal at 0.4.  Over the past 7 months similar results on multiple HFP's demonstrating mild persistent transaminitis.  His recent A1c on 09/25/2019 was 7.8.  No history of colonoscopy or endoscopy in our system.  Today he states he's doing ok overall. Last colonoscopy was about 15-20 years ago in [place], [place]. Denies abdominal pain, N/V, hematochezia, melena, fever, chills, unintentional weight loss. Denies URI or flu-like symptoms. Denies loss of sense of taste or smell. Hasn't been tested for COVID-19 recently. Hasn't had a vaccine yet, waiting a little longer for "more field testing" but will eventually get the vaccine. Denies chest pain, dyspnea, dizziness, lightheadedness, syncope, near syncope. Denies any other upper or lower GI symptoms.  Past Medical History:  Diagnosis Date  . Asthma    ass a child  . Cataract   . 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 Depression   . Diabetes mellitus without complication ([place])    Type II  . Hyperlipidemia   . Neuropathy   . Neuropathy   . Sleep apnea     Past Surgical History:  Procedure Laterality Date  . AMPUTATION Right 12/12/2015   Procedure: Right Transmetatarsal Amputation With  [facility] Placement;  Surgeon: [name], MD;  Location: [place];  Service: Orthopedics;  Laterality: Right;  . AMPUTATION Right 03/05/2016   Procedure: RIGHT BELOW KNEE AMPUTATION;  Surgeon: [name], MD;  Location: [place];  Service: Orthopedics;  Laterality: Right;  . CATARACT EXTRACTION W/PHACO Right 10/02/2018   Procedure: CATARACT EXTRACTION PHACO AND INTRAOCULAR LENS PLACEMENT ([place]);  Surgeon: Baruch Goldmann, MD;  Location: [facility];  Service: Ophthalmology;  Laterality: Right;  CDE: 130.53  . COLONOSCOPY    . EYE SURGERY Left    Cataract  . EYE SURGERY Right    r  . ORBITAL RECONSTRUCTION Right   . STUMP REVISION Right 11/10/2016   Procedure: REVISION RIGHT BELOW KNEE AMPUTATION;  Surgeon: [name], MD;  Location: [facility];  Service: Orthopedics;  Laterality: Right;  . toe removal Right 2012,2014    Current Outpatient Medications  Medication Sig Dispense Refill  . atorvastatin (LIPITOR) 20 MG tablet Take 1 tablet (20 mg total) by mouth daily. 90 tablet 3  . cholecalciferol (VITAMIN D3) 25 MCG (1000 UNIT) tablet Take 1,000 Units by mouth daily.    . Insulin Degludec (TRESIBA FLEXTOUCH) 200 UNIT/ML SOPN Inject 48 Units into the skin 2 (two) times daily. 18 mL 0  . insulin regular (NOVOLIN R,HUMULIN R) 100 units/mL injection Inject 12 units with 2 largest meals of the day and 10 units with smallest meal of the day 10 mL 11  . Insulin Syringe-Needle U-100 (RELION INSULIN SYR 0.3CC/30G)  30G X 5/16" 0.3 ML MISC 1 Syringe by Does not apply route 3 (three) times daily. Dx E11.42 100 each 11  . lisinopril (ZESTRIL) 5 MG tablet Take 0.5 tablets (2.5 mg total) by mouth daily. 45 tablet 3  . Multiple Vitamins-Minerals (MULTIVITAL PO) Take by mouth.    Marland Kitchen OVER THE COUNTER MEDICATION Sunflower 121m daily    . polyethylene glycol-electrolytes (TRILYTE) 420 g solution Take 4,000 mLs by mouth as directed. 4000 mL 0   No current facility-administered medications for this visit.    Allergies  as of 10/24/2019 - Review Complete 10/24/2019  Allergen Reaction Noted  . Bactrim [sulfamethoxazole-trimethoprim] Itching 12/12/2015  . Gentamicin Other (See Comments) 08/11/2015    Family History  Problem Relation Age of Onset  . Heart disease Father   . Diabetes Sister   . Diabetes Brother   . Heart attack Paternal Grandfather   . Colon cancer Maternal Grandfather        passed in the mid-60's.  . Liver disease Neg Hx     Social History   Socioeconomic History  . Marital status: Married    Spouse name: Not on file  . Number of children: 0  . Years of education: 12 . Highest education level: Not on file  Occupational History  . Occupation: pScientist, research (physical sciences)   Comment: [employer]  Tobacco Use  . Smoking status: Never Smoker  . Smokeless tobacco: Never Used  Substance and Sexual Activity  . Alcohol use: No  . Drug use: No  . Sexual activity: Not Currently    Birth control/protection: None  Other Topics Concern  . Not on file  Social History Narrative  . Not on file   Social Determinants of Health   Financial Resource Strain:   . Difficulty of Paying Living Expenses:   Food Insecurity:   . Worried About RCharity fundraiserin the Last Year:   . RArboriculturistin the Last Year:   Transportation Needs:   . LFilm/video editor(Medical):   .Marland KitchenLack of Transportation (Non-Medical):   Physical Activity:   . Days of Exercise per Week:   . Minutes of Exercise per Session:   Stress:   . Feeling of Stress :   Social Connections:   . Frequency of Communication with Friends and Family:   . Frequency of Social Gatherings with Friends and Family:   . Attends Religious Services:   . Active Member of Clubs or Organizations:   . Attends CArchivistMeetings:   .Marland KitchenMarital Status:   Intimate Partner Violence:   . Fear of Current or Ex-Partner:   . Emotionally Abused:   .Marland KitchenPhysically Abused:   . Sexually Abused:     Subjective: Review of Systems  Constitutional:  Negative for chills, fever, malaise/fatigue and weight loss.  HENT: Negative for congestion and sore throat.   Respiratory: Negative for cough and shortness of breath.   Cardiovascular: Negative for chest pain and palpitations.  Gastrointestinal: Negative for abdominal pain, blood in stool, diarrhea, melena, nausea and vomiting.  Musculoskeletal: Negative for joint pain and myalgias.  Skin: Negative for rash.  Neurological: Negative for dizziness and weakness.  Endo/Heme/Allergies: Does not bruise/bleed easily.  Psychiatric/Behavioral: Negative for depression. The patient is not nervous/anxious.   All other systems reviewed and are negative.      Objective: BP (!) 152/80   Pulse 91   Temp (!) 97.3 F (36.3 C) (Oral)   Ht 6' 2"  (1.88  m)   Wt 286 lb 12.8 oz (130.1 kg)   BMI 36.82 kg/m  Physical Exam Vitals and nursing note reviewed.  Constitutional:      General: He is not in acute distress.    Appearance: Normal appearance. He is obese. He is not ill-appearing, toxic-appearing or diaphoretic.  HENT:     Head: Normocephalic and atraumatic.     Nose: No congestion or rhinorrhea.  Eyes:     General: No scleral icterus. Cardiovascular:     Rate and Rhythm: Normal rate and regular rhythm.     Heart sounds: Normal heart sounds.  Pulmonary:     Effort: Pulmonary effort is normal.     Breath sounds: Normal breath sounds.  Abdominal:     General: Bowel sounds are normal. There is no distension.     Palpations: Abdomen is soft. There is no hepatomegaly, splenomegaly or mass.     Tenderness: There is no abdominal tenderness. There is no guarding or rebound.     Hernia: No hernia is present.  Musculoskeletal:     Cervical back: Neck supple.     Right Lower Extremity: Right leg is amputated below knee.  Skin:    General: Skin is warm and dry.     Coloration: Skin is not jaundiced.     Findings: No bruising or rash.  Neurological:     General: No focal deficit present.      Mental Status: He is alert and oriented to person, place, and time. Mental status is at baseline.  Psychiatric:        Mood and Affect: Mood normal.        Behavior: Behavior normal.        Thought Content: Thought content normal.       10/31/2019 11:05 AM   Disclaimer: This note was dictated with voice recognition software. Similar sounding words can inadvertently be transcribed and may not be corrected upon review.

## 2019-10-25 ENCOUNTER — Telehealth: Payer: Self-pay | Admitting: *Deleted

## 2019-10-25 NOTE — Telephone Encounter (Signed)
Make sure it is Novalog Flexpen

## 2019-10-25 NOTE — Telephone Encounter (Signed)
Okay to replace with NovoLog, same directions.  Please inform patient of change

## 2019-10-25 NOTE — Telephone Encounter (Signed)
done

## 2019-10-25 NOTE — Telephone Encounter (Signed)
NOVOLIN R RELION 100UNIT/ML not preferred by insurance  Preferred Medic[employer] Novolog U-100 Insulin Aspart  Novolog Flexpen U-100 insulin Humulin R U-500 (Conc) Insulin Humulin R u-500 (Conc) [employer]

## 2019-10-30 ENCOUNTER — Telehealth: Payer: Self-pay

## 2019-10-30 DIAGNOSIS — Z0000 Encounter for general adult medical examination without abnormal findings: Secondary | ICD-10-CM | POA: Insufficient documentation

## 2019-10-30 DIAGNOSIS — R7989 Other specified abnormal findings of blood chemistry: Secondary | ICD-10-CM | POA: Insufficient documentation

## 2019-10-30 NOTE — Telephone Encounter (Signed)
Sorry!  Insulins: Half dose the night before, none the morning of.  On prep day: Check CBG ac and hs as well (if they normally check their blood sugar) as if the patient feels like their blood sugar is off. Can use soda, juice (that's in [place]) as needed for any low blood sugar.  Check CBG on arrival to endo unit.

## 2019-10-30 NOTE — Telephone Encounter (Signed)
Eric, please advise how pt should adjust diabetic meds prior to TCS.

## 2019-10-31 ENCOUNTER — Encounter: Payer: Self-pay | Admitting: Nurse Practitioner

## 2019-10-31 ENCOUNTER — Ambulatory Visit (HOSPITAL_COMMUNITY)
Admission: RE | Admit: 2019-10-31 | Discharge: 2019-10-31 | Disposition: A | Discharge status: Home / Self Care | Payer: Medicare HMO | Source: Ambulatory Visit | Attending: Nurse Practitioner | Admitting: Nurse Practitioner

## 2019-10-31 ENCOUNTER — Other Ambulatory Visit: Payer: Self-pay

## 2019-10-31 DIAGNOSIS — Z0000 Encounter for general adult medical examination without abnormal findings: Secondary | ICD-10-CM | POA: Insufficient documentation

## 2019-10-31 DIAGNOSIS — R7989 Other specified abnormal findings of blood chemistry: Secondary | ICD-10-CM | POA: Insufficient documentation

## 2019-10-31 DIAGNOSIS — K7689 Other specified diseases of liver: Secondary | ICD-10-CM | POA: Diagnosis not present

## 2019-10-31 LAB — CBC WITH DIFFERENTIAL/PLATELET
Absolute Monocytes: 572 cells/uL (ref 200–950)
Basophils Absolute: 41 cells/uL (ref 0–200)
Basophils Relative: 0.7 %
Eosinophils Absolute: 183 cells/uL (ref 15–500)
Eosinophils Relative: 3.1 %
HCT: 46 % (ref 38.5–50.0)
Hemoglobin: 15.4 g/dL (ref 13.2–17.1)
Lymphs Abs: 1410 cells/uL (ref 850–3900)
MCH: 30.8 pg (ref 27.0–33.0)
MCHC: 33.5 g/dL (ref 32.0–36.0)
MCV: 92 fL (ref 80.0–100.0)
MPV: 12.1 fL (ref 7.5–12.5)
Monocytes Relative: 9.7 %
Neutro Abs: 3693 cells/uL (ref 1500–7800)
Neutrophils Relative %: 62.6 %
Platelets: 193 10*3/uL (ref 140–400)
RBC: 5 10*6/uL (ref 4.20–5.80)
RDW: 13.3 % (ref 11.0–15.0)
Total Lymphocyte: 23.9 %
WBC: 5.9 10*3/uL (ref 3.8–10.8)

## 2019-10-31 LAB — HEPATITIS B SURFACE ANTIGEN: Hepatitis B Surface Ag: NONREACTIVE

## 2019-10-31 LAB — ANA: Anti Nuclear Antibody (ANA): POSITIVE — AB

## 2019-10-31 LAB — MITOCHONDRIAL ANTIBODIES: Mitochondrial M2 Ab, IgG: <=20 U

## 2019-10-31 LAB — HEPATITIS C ANTIBODY
Hepatitis C Ab: NONREACTIVE
SIGNAL TO CUT-OFF: 0.01 (ref <1.00)

## 2019-10-31 LAB — FERRITIN: Ferritin: 147 ng/mL (ref 24–380)

## 2019-10-31 LAB — HEPATIC FUNCTION PANEL
AG Ratio: 2 (calc) (ref 1.0–2.5)
ALT: 47 U/L — ABNORMAL HIGH (ref 9–46)
AST: 29 U/L (ref 10–35)
Albumin: 4.5 g/dL (ref 3.6–5.1)
Alkaline phosphatase (APISO): 43 U/L (ref 35–144)
Bilirubin, Direct: 0.1 mg/dL (ref 0.0–0.2)
Globulin: 2.3 g/dL (calc) (ref 1.9–3.7)
Indirect Bilirubin: 0.5 mg/dL (calc) (ref 0.2–1.2)
Total Bilirubin: 0.6 mg/dL (ref 0.2–1.2)
Total Protein: 6.8 g/dL (ref 6.1–8.1)

## 2019-10-31 LAB — HEPATITIS A ANTIBODY, TOTAL: Hepatitis A AB,Total: NONREACTIVE

## 2019-10-31 LAB — HEPATITIS B CORE ANTIBODY, TOTAL: Hep B Core Total Ab: NONREACTIVE

## 2019-10-31 LAB — HEPATITIS B SURFACE ANTIBODY,QUALITATIVE: Hep B S Ab: NONREACTIVE

## 2019-10-31 LAB — ANTI-SMOOTH MUSCLE ANTIBODY, IGG: Actin (Smooth Muscle) Antibody (IGG): <20 U (ref <20)

## 2019-10-31 LAB — ANTI-NUCLEAR AB-TITER (ANA TITER): ANA Titer 1: 1:40 {titer} — ABNORMAL HIGH

## 2019-10-31 LAB — CERULOPLASMIN: Ceruloplasmin: 33 mg/dL (ref 18–36)

## 2019-10-31 NOTE — Assessment & Plan Note (Signed)
Currently overdue for colonoscopy.  Last colonoscopy was 15 to 20 years ago in [place], [place].  Generally asymptomatic from a GI standpoint.  Further recommendations for liver work-up as per below.  We will proceed with colonoscopy at this time.  Proceed with TCS with Dr. Gala Romney in near future: the risks, benefits, and alternatives have been discussed with the patient in detail. The patient states understanding and desires to proceed.  The patient is on a couple insulin medications which we will adjust prior to his procedure. The patient is not on any other anticoagulants, anxiolytics, chronic pain medications, antidepressants, antidiabetics, or iron supplements.  Conscious sedation should be adequate for his procedure.

## 2019-10-31 NOTE — Assessment & Plan Note (Signed)
Mild persistent transaminitis with significant diabetes, hyperlipidemia, obesity.  Likely fatty liver disease.  However, we will proceed with further work-up to ensure no other occult etiologies.  Check CBC, CMP, viral hepatitis labs, ferritin, ceruloplasmin, autoimmune serologies.  Further recommendations to follow.  I will also order right upper quadrant ultrasound for evaluation of liver parenchyma.  He was given patient education information on fatty liver disease and prevention.  Return for follow-up in 3 months.

## 2019-10-31 NOTE — Telephone Encounter (Signed)
Instructions mailed. LMOVM for endo scheduler to add comment to orders for CBG to be checked on arrival to endo unit.

## 2019-11-28 DIAGNOSIS — R69 Illness, unspecified: Secondary | ICD-10-CM | POA: Diagnosis not present

## 2019-12-17 ENCOUNTER — Other Ambulatory Visit (HOSPITAL_COMMUNITY)
Admission: RE | Admit: 2019-12-17 | Discharge: 2019-12-17 | Disposition: A | Discharge status: Home / Self Care | Payer: Medicare HMO | Source: Ambulatory Visit | Attending: Internal Medicine | Admitting: Internal Medicine

## 2019-12-17 ENCOUNTER — Telehealth: Payer: Self-pay

## 2019-12-17 ENCOUNTER — Other Ambulatory Visit: Payer: Self-pay

## 2019-12-17 DIAGNOSIS — Z01812 Encounter for preprocedural laboratory examination: Secondary | ICD-10-CM | POA: Diagnosis not present

## 2019-12-17 DIAGNOSIS — Z20822 Contact with and (suspected) exposure to covid-19: Secondary | ICD-10-CM | POA: Insufficient documentation

## 2019-12-17 LAB — SARS CORONAVIRUS 2 (TAT 6-24 HRS): SARS Coronavirus 2: NEGATIVE

## 2019-12-17 NOTE — Telephone Encounter (Signed)
VM was received. Pt has a TCS on this Wednesday and he would like his prep. Returned pts call. pts prep was sent to his pharmacy on 10/24/19. Pt will call [facility] for his prep.

## 2019-12-19 ENCOUNTER — Encounter (HOSPITAL_COMMUNITY)
Admission: RE | Disposition: A | Discharge status: Home / Self Care | Payer: Self-pay | Source: Home / Self Care | Attending: Internal Medicine

## 2019-12-19 ENCOUNTER — Encounter (HOSPITAL_COMMUNITY): Payer: Self-pay | Admitting: Internal Medicine

## 2019-12-19 ENCOUNTER — Ambulatory Visit (HOSPITAL_COMMUNITY)
Admission: RE | Admit: 2019-12-19 | Discharge: 2019-12-19 | Disposition: A | Discharge status: Home / Self Care | Payer: Medicare HMO | Attending: Internal Medicine | Admitting: Internal Medicine

## 2019-12-19 ENCOUNTER — Other Ambulatory Visit: Payer: Self-pay

## 2019-12-19 DIAGNOSIS — Z794 Long term (current) use of insulin: Secondary | ICD-10-CM | POA: Diagnosis not present

## 2019-12-19 DIAGNOSIS — G4730 Sleep apnea, unspecified: Secondary | ICD-10-CM | POA: Insufficient documentation

## 2019-12-19 DIAGNOSIS — K5730 Diverticulosis of large intestine without perforation or abscess without bleeding: Secondary | ICD-10-CM | POA: Insufficient documentation

## 2019-12-19 DIAGNOSIS — Z89511 Acquired absence of right leg below knee: Secondary | ICD-10-CM | POA: Insufficient documentation

## 2019-12-19 DIAGNOSIS — Z1211 Encounter for screening for malignant neoplasm of colon: Secondary | ICD-10-CM | POA: Diagnosis present

## 2019-12-19 DIAGNOSIS — E119 Type 2 diabetes mellitus without complications: Secondary | ICD-10-CM | POA: Insufficient documentation

## 2019-12-19 DIAGNOSIS — Q438 Other specified congenital malformations of intestine: Secondary | ICD-10-CM | POA: Diagnosis not present

## 2019-12-19 DIAGNOSIS — Z833 Family history of diabetes mellitus: Secondary | ICD-10-CM | POA: Insufficient documentation

## 2019-12-19 DIAGNOSIS — E785 Hyperlipidemia, unspecified: Secondary | ICD-10-CM | POA: Insufficient documentation

## 2019-12-19 HISTORY — PX: COLONOSCOPY: SHX5424

## 2019-12-19 LAB — GLUCOSE, CAPILLARY: Glucose-Capillary: 88 mg/dL (ref 70–99)

## 2019-12-19 SURGERY — COLONOSCOPY
Anesthesia: Moderate Sedation

## 2019-12-19 MED ORDER — SODIUM CHLORIDE 0.9 % IV SOLN
INTRAVENOUS | Status: DC
  Administered 2019-12-19: 1000 mL via INTRAVENOUS

## 2019-12-19 MED ORDER — ONDANSETRON HCL 4 MG/2ML IJ SOLN
INTRAMUSCULAR | Status: AC
  Filled 2019-12-19: qty 2

## 2019-12-19 MED ORDER — MEPERIDINE HCL 50 MG/ML IJ SOLN
INTRAMUSCULAR | Status: AC
  Filled 2019-12-19: qty 1

## 2019-12-19 MED ORDER — MIDAZOLAM HCL 5 MG/5ML IJ SOLN
INTRAMUSCULAR | Status: AC
  Filled 2019-12-19: qty 10

## 2019-12-19 MED ORDER — MEPERIDINE HCL 100 MG/ML IJ SOLN
INTRAMUSCULAR | Status: DC | PRN
  Administered 2019-12-19: 25 mg

## 2019-12-19 MED ORDER — ONDANSETRON HCL 4 MG/2ML IJ SOLN
INTRAMUSCULAR | Status: DC | PRN
  Administered 2019-12-19: 4 mg via INTRAVENOUS

## 2019-12-19 MED ORDER — MIDAZOLAM HCL 5 MG/5ML IJ SOLN
INTRAMUSCULAR | Status: DC | PRN
  Administered 2019-12-19: 1 mg via INTRAVENOUS
  Administered 2019-12-19: 2 mg via INTRAVENOUS
  Administered 2019-12-19: 1 mg via INTRAVENOUS

## 2019-12-19 NOTE — Op Note (Signed)
[facility] Patient Name: Rick Lane Procedure Date: 12/19/2019 10:41 AM MRN: [id number] Date of Birth: [birth year] Attending MD: Norvel Richards , MD CSN: [id number] Age: 69 Admit Type: Outpatient Procedure:                Colonoscopy Indications:              Screening for colorectal malignant neoplasm Providers:                Norvel Richards, MD, Janeece Riggers, RN, Raphael Gibney, Technician Referring MD:              Medicines:                Midazolam 4 mg IV, Meperidine 25 mg IV, Ondansetron                            4 mg IV Complications:            No immediate complications. Estimated Blood Loss:     Estimated blood loss: none. Procedure:                Pre-Anesthesia Assessment:                           - Prior to the procedure, a History and Physical                            was performed, and patient medications and                            allergies were reviewed. The patient's tolerance of                            previous anesthesia was also reviewed. The risks                            and benefits of the procedure and the sedation                            options and risks were discussed with the patient.                            All questions were answered, and informed consent                            was obtained. Prior Anticoagulants: The patient has                            taken no previous anticoagulant or antiplatelet                            agents. ASA Grade Assessment: II - A patient with  mild systemic disease. After reviewing the risks                            and benefits, the patient was deemed in                            satisfactory condition to undergo the procedure.                           After obtaining informed consent, the colonoscope                            was passed under direct vision. Throughout the                            procedure, the  patient's blood pressure, pulse, and                            oxygen saturations were monitored continuously. The                            [id number] scope was introduced through                            the anus and advanced to the the cecum, identified                            by appendiceal orifice and ileocecal valve. The                            colonoscopy was performed without difficulty. The                            patient tolerated the procedure well. The quality                            of the bowel preparation was adequate. Scope In: 11:04:40 AM Scope Out: 11:25:21 AM Scope Withdrawal Time: 0 hours 10 minutes 28 seconds  Total Procedure Duration: 0 hours 20 minutes 41 seconds  Findings:      The perianal and digital rectal examinations were normal.      Scattered small-mouthed diverticula were found in the sigmoid colon and       descending colon. Redundant colon. External abdominal pressure required       to reach the cecum.      The exam was otherwise without abnormality on direct and retroflexion       views. Impression:               - Diverticulosis in the sigmoid colon and in the                            descending colon. Redundant colon.                           - The examination was otherwise  normal on direct                            and retroflexion views.                           - No specimens collected. Moderate Sedation:      Moderate (conscious) sedation was administered by the endoscopy nurse       and supervised by the endoscopist. The following parameters were       monitored: oxygen saturation, heart rate, blood pressure, respiratory       rate, EKG, adequacy of pulmonary ventilation, and response to care.       Total physician intraservice time was 24 minutes. Recommendation:           - Patient has a contact number available for                            emergencies. The signs and symptoms of potential                             delayed complications were discussed with the                            patient. Return to normal activities tomorrow.                            Written discharge instructions were provided to the                            patient.                           - Resume previous diet.                           - Continue present medications.                           - No repeat colonoscopy due to current age (50                            years or older).                           - Return to GI clinic (date not yet determined). Procedure Code(s):        --- Professional ---                           [phone], Colonoscopy, flexible; diagnostic, including                            collection of specimen(s) by brushing or washing,                            when performed (separate procedure)  [id number], Moderate sedation; each additional 15                            minutes intraservice time                           G0500, Moderate sedation services provided by the                            same physician or other qualified health care                            professional performing a gastrointestinal                            endoscopic service that sedation supports,                            requiring the presence of an independent trained                            observer to assist in the monitoring of the                            patient's level of consciousness and physiological                            status; initial 15 minutes of intra-service time;                            patient age 17 years or older (additional time may                            be reported with [phone], as appropriate) Diagnosis Code(s):        --- Professional ---                           Z12.11, Encounter for screening for malignant                            neoplasm of colon                           K57.30, Diverticulosis of large intestine without                             perforation or abscess without bleeding CPT copyright 2019 American Medical Association. All rights reserved. The codes documented in this report are preliminary and upon coder review may  be revised to meet current compliance requirements. Cristopher Estimable. Benson Porcaro, MD Norvel Richards, MD 12/19/2019 11:33:39 AM This report has been signed electronically. Number of Addenda: 0

## 2019-12-19 NOTE — H&P (Signed)
@LOGO @   Primary Care Physician:  Janora Norlander, DO Primary Gastroenterologist:  Dr. Gala Romney  Pre-Procedure History & Physical: HPI:  Rick Lane is a 69 y.o. male is here for a screening colonoscopy.  Average risk screening examination.  No bowel symptoms.  Possible negative colonoscopy 20 years ago.  Past Medical History:  Diagnosis Date  . Asthma    ass a child  . Cataract   . 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 Depression   . Diabetes mellitus without complication ([place])    Type II  . Hyperlipidemia   . Neuropathy   . Neuropathy   . Sleep apnea     Past Surgical History:  Procedure Laterality Date  . AMPUTATION Right 12/12/2015   Procedure: Right Transmetatarsal Amputation With [facility] Placement;  Surgeon: [name], MD;  Location: [facility];  Service: Orthopedics;  Laterality: Right;  . AMPUTATION Right 03/05/2016   Procedure: RIGHT BELOW KNEE AMPUTATION;  Surgeon: [name], MD;  Location: [facility];  Service: Orthopedics;  Laterality: Right;  . CATARACT EXTRACTION W/PHACO Right 10/02/2018   Procedure: CATARACT EXTRACTION PHACO AND INTRAOCULAR LENS PLACEMENT ([place]);  Surgeon: Baruch Goldmann, MD;  Location: [facility];  Service: Ophthalmology;  Laterality: Right;  CDE: 130.53  . COLONOSCOPY    . EYE SURGERY Left    Cataract  . EYE SURGERY Right    r  . ORBITAL RECONSTRUCTION Right   . STUMP REVISION Right 11/10/2016   Procedure: REVISION RIGHT BELOW KNEE AMPUTATION;  Surgeon: [name], MD;  Location: [place];  Service: Orthopedics;  Laterality: Right;  . toe removal Right 2012,2014    Prior to Admission medications   Medication Sig Start Date End Date Taking? Authorizing Provider  atorvastatin (LIPITOR) 20 MG tablet Take 1 tablet (20 mg total) by mouth daily. 03/27/19  Yes Gottschalk, Leatrice Jewels M, DO  cholecalciferol (VITAMIN D3) 25 MCG (1000 UNIT) tablet Take 1,000 Units by mouth daily.   Yes [provider]  Insulin  Degludec (TRESIBA FLEXTOUCH) 200 UNIT/ML SOPN Inject 48 Units into the skin 2 (two) times daily. Patient taking differently: Inject 47 Units into the skin 2 (two) times daily.  09/20/19  Yes Ronnie Doss M, DO  Lecithin 1200 MG CAPS Take 1,200 mg by mouth daily. Sunflower Lecithin   Yes [provider]  lisinopril (ZESTRIL) 5 MG tablet Take 0.5 tablets (2.5 mg total) by mouth daily. 09/25/19  Yes Ronnie Doss M, DO  Multiple Vitamin (MULTIVITAMIN WITH MINERALS) TABS tablet Take 1 tablet by mouth daily. Equate Multivitamin for Men 50+   Yes [provider]  NOVOLOG FLEXPEN 100 UNIT/ML FlexPen Inject 10-12 Units into the skin See admin instructions. Inject 10 units subcutaneously with breakfast &  Inject 12 units subcutaneously with BOTH lunch & supper 10/26/19  Yes [provider]  polyethylene glycol-electrolytes (TRILYTE) 420 g solution Take 4,000 mLs by mouth as directed. 10/24/19  Yes Harshaan Whang, Cristopher Estimable, MD  Insulin Syringe-Needle U-100 (RELION INSULIN SYR 0.3CC/30G) 30G X 5/16" 0.3 ML MISC 1 Syringe by Does not apply route 3 (three) times daily. Dx E11.42 04/07/18   Janora Norlander, DO    Allergies as of 10/24/2019 - Review Complete 10/24/2019  Allergen Reaction Noted  . Bactrim [sulfamethoxazole-trimethoprim] Itching 12/12/2015  . Gentamicin Other (See Comments) 08/11/2015    Family History  Problem Relation Age of Onset  . Heart disease Father   . Diabetes Sister   . Diabetes  Brother   . Heart attack Paternal Grandfather   . Colon cancer Maternal Grandfather        passed in the mid-60's.  . Liver disease Neg Hx     Social History   Socioeconomic History  . Marital status: Married    Spouse name: Not on file  . Number of children: 0  . Years of education: 23  . Highest education level: Not on file  Occupational History  . Occupation: Scientist, research (physical sciences)    Comment: [employer]  Tobacco Use  . Smoking status: Never Smoker  . Smokeless tobacco: Never  Used  Substance and Sexual Activity  . Alcohol use: No  . Drug use: No  . Sexual activity: Not Currently    Birth control/protection: None  Other Topics Concern  . Not on file  Social History Narrative  . Not on file   Social Determinants of Health   Financial Resource Strain:   . Difficulty of Paying Living Expenses:   Food Insecurity:   . Worried About Charity fundraiser in the Last Year:   . Arboriculturist in the Last Year:   Transportation Needs:   . Film/video editor (Medical):   Marland Kitchen Lack of Transportation (Non-Medical):   Physical Activity:   . Days of Exercise per Week:   . Minutes of Exercise per Session:   Stress:   . Feeling of Stress :   Social Connections:   . Frequency of Communication with Friends and Family:   . Frequency of Social Gatherings with Friends and Family:   . Attends Religious Services:   . Active Member of Clubs or Organizations:   . Attends Archivist Meetings:   Marland Kitchen Marital Status:   Intimate Partner Violence:   . Fear of Current or Ex-Partner:   . Emotionally Abused:   Marland Kitchen Physically Abused:   . Sexually Abused:     Review of Systems: See HPI, otherwise negative ROS  Physical Exam: There were no vitals taken for this visit. General:   Alert,  Well-developed, well-nourished, pleasant and cooperative in NAD rackles, or rhonchi. No acute distress. Heart:  Regular rate and rhythm; no murmurs, clicks, rubs,  or gallops. Abdomen:  Soft, nontender and nondistended. No masses, hepatosplenomegaly or hernias noted. Normal bowel sounds, without guarding, and without rebound.    Impression/Plan: Rick Lane is now here to undergo a screening colonoscopy.  Average risk screening examination  Risks, benefits, limitations, imponderables and alternatives regarding colonoscopy have been reviewed with the patient. Questions have been answered. All parties agreeable.     Notice:  This dictation was prepared with Dragon dictation along  with smaller phrase technology. Any transcriptional errors that result from this process are unintentional and may not be corrected upon review.

## 2019-12-19 NOTE — Discharge Instructions (Signed)
Colonoscopy Discharge Instructions  Read the instructions outlined below and refer to this sheet in the next few weeks. These discharge instructions provide you with general information on caring for yourself after you leave the hospital. Your doctor may also give you specific instructions. While your treatment has been planned according to the most current medical practices available, unavoidable complications occasionally occur. If you have any problems or questions after discharge, call Dr. Gala Romney at [phone]. ACTIVITY  You may resume your regular activity, but move at a slower pace for the next 24 hours.   Take frequent rest periods for the next 24 hours.   Walking will help get rid of the air and reduce the bloated feeling in your belly (abdomen).   No driving for 24 hours (because of the medicine (anesthesia) used during the test).    Do not sign any important legal documents or operate any machinery for 24 hours (because of the anesthesia used during the test).  NUTRITION  Drink plenty of fluids.   You may resume your normal diet as instructed by your doctor.   Begin with a light meal and progress to your normal diet. Heavy or fried foods are harder to digest and may make you feel sick to your stomach (nauseated).   Avoid alcoholic beverages for 24 hours or as instructed.  MEDICATIONS  You may resume your normal medications unless your doctor tells you otherwise.  WHAT YOU CAN EXPECT TODAY  Some feelings of bloating in the abdomen.   Passage of more gas than usual.   Spotting of blood in your stool or on the toilet paper.  IF YOU HAD POLYPS REMOVED DURING THE COLONOSCOPY:  No aspirin products for 7 days or as instructed.   No alcohol for 7 days or as instructed.   Eat a soft diet for the next 24 hours.  FINDING OUT THE RESULTS OF YOUR TEST Not all test results are available during your visit. If your test results are not back during the visit, make an appointment  with your caregiver to find out the results. Do not assume everything is normal if you have not heard from your caregiver or the medical facility. It is important for you to follow up on all of your test results.  SEEK IMMEDIATE MEDICAL ATTENTION IF:  You have more than a spotting of blood in your stool.   Your belly is swollen (abdominal distention).   You are nauseated or vomiting.   You have a temperature over 101.   You have abdominal pain or discomfort that is severe or gets worse throughout the day.    Diverticulosis information provided  Colon was normal aside from diverticulosis  I do not recommend a future colonoscopy unless new symptoms develop  At patient request I called wife Vaughan Basta at [phone] and left message with impression and recommendations.   Diverticulosis  Diverticulosis is a condition that develops when small pouches (diverticula) form in the wall of the large intestine (colon). The colon is where water is absorbed and stool (feces) is formed. The pouches form when the inside layer of the colon pushes through weak spots in the outer layers of the colon. You may have a few pouches or many of them. The pouches usually do not cause problems unless they become inflamed or infected. When this happens, the condition is called diverticulitis. What are the causes? The cause of this condition is not known. What increases the risk? The following factors may make you more likely  to develop this condition:  Being older than age 60. Your risk for this condition increases with age. Diverticulosis is rare among people younger than age 52. By age 39, many people have it.  Eating a low-fiber diet.  Having frequent constipation.  Being overweight.  Not getting enough exercise.  Smoking.  Taking over-the-counter pain medicines, like aspirin and ibuprofen.  Having a family history of diverticulosis. What are the signs or symptoms? In most people, there are no  symptoms of this condition. If you do have symptoms, they may include:  Bloating.  Cramps in the abdomen.  Constipation or diarrhea.  Pain in the lower left side of the abdomen. How is this diagnosed? Because diverticulosis usually has no symptoms, it is most often diagnosed during an exam for other colon problems. The condition may be diagnosed by:  Using a flexible scope to examine the colon (colonoscopy).  Taking an X-ray of the colon after dye has been put into the colon (barium enema).  Having a CT scan. How is this treated? You may not need treatment for this condition. Your health care provider may recommend treatment to prevent problems. You may need treatment if you have symptoms or if you previously had diverticulitis. Treatment may include:  Eating a high-fiber diet.  Taking a fiber supplement.  Taking a live bacteria supplement (probiotic).  Taking medicine to relax your colon. Follow these instructions at home: Medicines  Take over-the-counter and prescription medicines only as told by your health care provider.  If told by your health care provider, take a fiber supplement or probiotic. Constipation prevention Your condition may cause constipation. To prevent or treat constipation, you may need to:  Drink enough fluid to keep your urine pale yellow.  Take over-the-counter or prescription medicines.  Eat foods that are high in fiber, such as beans, whole grains, and fresh fruits and vegetables.  Limit foods that are high in fat and processed sugars, such as fried or sweet foods.  General instructions  Try not to strain when you have a bowel movement.  Keep all follow-up visits as told by your health care provider. This is important. Contact a health care provider if you:  Have pain in your abdomen.  Have bloating.  Have cramps.  Have not had a bowel movement in 3 days. Get help right away if:  Your pain gets worse.  Your bloating becomes very  bad.  You have a fever or chills, and your symptoms suddenly get worse.  You vomit.  You have bowel movements that are bloody or black.  You have bleeding from your rectum. Summary  Diverticulosis is a condition that develops when small pouches (diverticula) form in the wall of the large intestine (colon).  You may have a few pouches or many of them.  This condition is most often diagnosed during an exam for other colon problems.  Treatment may include increasing the fiber in your diet, taking supplements, or taking medicines. This information is not intended to replace advice given to you by your health care provider. Make sure you discuss any questions you have with your health care provider. Document Revised: 02/08/2019 Document Reviewed: 02/08/2019 Elsevier Patient Education  [place].

## 2019-12-20 ENCOUNTER — Telehealth: Payer: Self-pay | Admitting: Emergency Medicine

## 2019-12-20 NOTE — Telephone Encounter (Signed)
Pt wife linda called placed on speaker given verbal to discuss results with wife. Name and dob was verified  [place] results given, Overall your ultrasound shows some mild liver enlargement and "steatosis" indicating likely fatty liver disease, as we suspected. Diet and exercise will be key to his treatment. We will see him at his follow-up visit, call if any questions or concerns wife stated she understood and thanked me for the results

## 2019-12-21 ENCOUNTER — Other Ambulatory Visit: Payer: Self-pay | Admitting: Family Medicine

## 2020-01-10 DIAGNOSIS — M79676 Pain in unspecified toe(s): Secondary | ICD-10-CM | POA: Diagnosis not present

## 2020-01-10 DIAGNOSIS — B351 Tinea unguium: Secondary | ICD-10-CM | POA: Diagnosis not present

## 2020-01-23 ENCOUNTER — Encounter: Payer: Self-pay | Admitting: Internal Medicine

## 2020-01-23 ENCOUNTER — Ambulatory Visit: Payer: Medicare HMO | Admitting: Nurse Practitioner

## 2020-01-23 DIAGNOSIS — R69 Illness, unspecified: Secondary | ICD-10-CM | POA: Diagnosis not present

## 2020-01-23 NOTE — Progress Notes (Deleted)
Referring Provider: Janora Norlander, DO Primary Care Physician:  Janora Norlander, DO Primary GI:  Dr. Gala Romney  No chief complaint on file.   HPI:   Rick Lane is a 69 y.o. male who presents for follow-up.  The patient was last seen in our office 10/24/2019 for elevated LFTs and to schedule a colonoscopy.  Noted history of diabetes, hyperlipidemia.  Noted mild persistent elevated LFTs in March of this year with ALT at 54, AST normal at 30, other LFTs normal.  Overall stable.  Recent A1c in March was 7.8.  At last visit noted last colonoscopy 15 to 20 years ago in [place], [place].  No overt GI complaints.  Overall felt likely fatty liver disease.  Recommended CBC, CMP, viral hepatitis, ferritin, ceruloplasmin, autoimmune serologies for full work-up.  Also recommended right upper quadrant ultrasound for evaluation of liver parenchyma.  Colonoscopy to be scheduled.  His labs are completed 10/24/2019.  CBC normal, HFP with mild persistent transaminitis with ALT of 47 (upper limit normal 46) and other LFTs normal.  Viral hepatitis labs normal.  Ferritin normal.  Ceruloplasmin normal.  Autoimmune serologies revealed positive ANA with 1:40 titer (low level) and in the setting of normal mitochondrial antibodies and smooth muscle antibodies generally nonspecific.  Right upper quadrant ultrasound completed 10/31/2019 with hepatomegaly and increased echogenicity likely reflecting diffuse steatosis.  Inform patient of results, overall likely fatty liver disease.  Again recommended lifestyle changes with diet and exercise.  Today he states   Past Medical History:  Diagnosis Date  . Asthma    ass a child  . Cataract   . 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 Depression   . Diabetes mellitus without complication ([place])    Type II  . Hyperlipidemia   . Neuropathy   . Neuropathy   . Sleep apnea     Past Surgical History:  Procedure Laterality Date    . AMPUTATION Right 12/12/2015   Procedure: Right Transmetatarsal Amputation With [facility] Placement;  Surgeon: [name], MD;  Location: [place];  Service: Orthopedics;  Laterality: Right;  . AMPUTATION Right 03/05/2016   Procedure: RIGHT BELOW KNEE AMPUTATION;  Surgeon: [name], MD;  Location: [place];  Service: Orthopedics;  Laterality: Right;  . CATARACT EXTRACTION W/PHACO Right 10/02/2018   Procedure: CATARACT EXTRACTION PHACO AND INTRAOCULAR LENS PLACEMENT ([place]);  Surgeon: Baruch Goldmann, MD;  Location: [facility];  Service: Ophthalmology;  Laterality: Right;  CDE: 130.53  . COLONOSCOPY    . COLONOSCOPY N/A 12/19/2019   Procedure: COLONOSCOPY;  Surgeon: Daneil Dolin, MD;  [facility];  Service: Endoscopy;  Laterality: N/A;  10:30am  . EYE SURGERY Left    Cataract  . EYE SURGERY Right    r  . ORBITAL RECONSTRUCTION Right   . STUMP REVISION Right 11/10/2016   Procedure: REVISION RIGHT BELOW KNEE AMPUTATION;  Surgeon: [name], MD;  Location: [place];  Service: Orthopedics;  Laterality: Right;  . toe removal Right 2012,2014    Current Outpatient Medications  Medication Sig Dispense Refill  . atorvastatin (LIPITOR) 20 MG tablet Take 1 tablet (20 mg total) by mouth daily. 90 tablet 3  . cholecalciferol (VITAMIN D3) 25 MCG (1000 UNIT) tablet Take 1,000 Units by mouth daily.    . Insulin Syringe-Needle U-100 (RELION INSULIN SYR 0.3CC/30G) 30G X 5/16" 0.3 ML MISC 1 Syringe by Does not apply route 3 (three) times daily. Dx  E11.42 100 each 11  . Lecithin 1200 MG CAPS Take 1,200 mg by mouth daily. Sunflower Lecithin    . lisinopril (ZESTRIL) 5 MG tablet Take 0.5 tablets (2.5 mg total) by mouth daily. 45 tablet 3  . Multiple Vitamin (MULTIVITAMIN WITH MINERALS) TABS tablet Take 1 tablet by mouth daily. Equate Multivitamin for Men 86+    . NOVOLOG FLEXPEN 100 UNIT/ML FlexPen Inject 10-12 Units into the skin See admin instructions. Inject 10 units subcutaneously with breakfast &   Inject 12 units subcutaneously with BOTH lunch & supper    . polyethylene glycol-electrolytes (TRILYTE) 420 g solution Take 4,000 mLs by mouth as directed. 4000 mL 0  . TRESIBA FLEXTOUCH 200 UNIT/ML FlexTouch Pen INJECT 48 UNITS SUBCUTANEOUSLY TWICE DAILY 18 mL 0   No current facility-administered medications for this visit.    Allergies as of 01/23/2020 - Review Complete 12/19/2019  Allergen Reaction Noted  . Bactrim [sulfamethoxazole-trimethoprim] Itching 12/12/2015  . Gentamicin Other (See Comments) 08/11/2015    Family History  Problem Relation Age of Onset  . Heart disease Father   . Diabetes Sister   . Diabetes Brother   . Heart attack Paternal Grandfather   . Colon cancer Maternal Grandfather        passed in the mid-60's.  . Liver disease Neg Hx     Social History   Socioeconomic History  . Marital status: Married    Spouse name: Not on file  . Number of children: 0  . Years of education: 58  . Highest education level: Not on file  Occupational History  . Occupation: Scientist, research (physical sciences)    Comment: [employer]  Tobacco Use  . Smoking status: Never Smoker  . Smokeless tobacco: Never Used  Vaping Use  . Vaping Use: Never used  Substance and Sexual Activity  . Alcohol use: No  . Drug use: No  . Sexual activity: Not Currently    Birth control/protection: None  Other Topics Concern  . Not on file  Social History Narrative  . Not on file   Social Determinants of Health   Financial Resource Strain:   . Difficulty of Paying Living Expenses:   Food Insecurity:   . Worried About Charity fundraiser in the Last Year:   . Arboriculturist in the Last Year:   Transportation Needs:   . Film/video editor (Medical):   Marland Kitchen Lack of Transportation (Non-Medical):   Physical Activity:   . Days of Exercise per Week:   . Minutes of Exercise per Session:   Stress:   . Feeling of Stress :   Social Connections:   . Frequency of Communication with Friends and Family:   .  Frequency of Social Gatherings with Friends and Family:   . Attends Religious Services:   . Active Member of Clubs or Organizations:   . Attends Archivist Meetings:   Marland Kitchen Marital Status:     Subjective: Review of Systems  Constitutional: Negative for chills, fever, malaise/fatigue and weight loss.  HENT: Negative for congestion and sore throat.   Respiratory: Negative for cough and shortness of breath.   Cardiovascular: Negative for chest pain and palpitations.  Gastrointestinal: Negative for abdominal pain, blood in stool, diarrhea, melena, nausea and vomiting.  Musculoskeletal: Negative for joint pain and myalgias.  Skin: Negative for rash.  Neurological: Negative for dizziness and weakness.  Endo/Heme/Allergies: Does not bruise/bleed easily.  Psychiatric/Behavioral: Negative for depression. The patient is not nervous/anxious.   All other systems reviewed  and are negative.    Objective: There were no vitals taken for this visit. Physical Exam Vitals and nursing note reviewed.  Constitutional:      General: He is not in acute distress.    Appearance: Normal appearance. He is not ill-appearing, toxic-appearing or diaphoretic.  HENT:     Head: Normocephalic and atraumatic.     Nose: No congestion or rhinorrhea.  Eyes:     General: No scleral icterus. Cardiovascular:     Rate and Rhythm: Normal rate and regular rhythm.     Heart sounds: Normal heart sounds.  Pulmonary:     Effort: Pulmonary effort is normal.     Breath sounds: Normal breath sounds.  Abdominal:     General: Bowel sounds are normal. There is no distension.     Palpations: Abdomen is soft. There is no hepatomegaly, splenomegaly or mass.     Tenderness: There is no abdominal tenderness. There is no guarding or rebound.     Hernia: No hernia is present.  Musculoskeletal:     Cervical back: Neck supple.  Skin:    General: Skin is warm and dry.     Coloration: Skin is not jaundiced.     Findings: No  bruising or rash.  Neurological:     General: No focal deficit present.     Mental Status: He is alert and oriented to person, place, and time. Mental status is at baseline.  Psychiatric:        Mood and Affect: Mood normal.        Behavior: Behavior normal.        Thought Content: Thought content normal.       01/23/2020 1:15 PM   Disclaimer: This note was dictated with voice recognition software. Similar sounding words can inadvertently be transcribed and may not be corrected upon review.

## 2020-02-29 ENCOUNTER — Other Ambulatory Visit: Payer: Self-pay | Admitting: *Deleted

## 2020-02-29 MED ORDER — TRESIBA FLEXTOUCH 200 UNIT/ML ~~LOC~~ SOPN: PEN_INJECTOR | SUBCUTANEOUS | 0 refills | Status: DC

## 2020-03-18 DIAGNOSIS — E119 Type 2 diabetes mellitus without complications: Secondary | ICD-10-CM | POA: Diagnosis not present

## 2020-03-18 DIAGNOSIS — H02831 Dermatochalasis of right upper eyelid: Secondary | ICD-10-CM | POA: Diagnosis not present

## 2020-03-18 DIAGNOSIS — H02834 Dermatochalasis of left upper eyelid: Secondary | ICD-10-CM | POA: Diagnosis not present

## 2020-03-18 DIAGNOSIS — D3131 Benign neoplasm of right choroid: Secondary | ICD-10-CM | POA: Diagnosis not present

## 2020-03-18 DIAGNOSIS — H26491 Other secondary cataract, right eye: Secondary | ICD-10-CM | POA: Diagnosis not present

## 2020-03-18 DIAGNOSIS — H52209 Unspecified astigmatism, unspecified eye: Secondary | ICD-10-CM | POA: Diagnosis not present

## 2020-03-18 DIAGNOSIS — H35433 Paving stone degeneration of retina, bilateral: Secondary | ICD-10-CM | POA: Diagnosis not present

## 2020-03-18 DIAGNOSIS — H5052 Exophoria: Secondary | ICD-10-CM | POA: Diagnosis not present

## 2020-03-18 DIAGNOSIS — Z0100 Encounter for examination of eyes and vision without abnormal findings: Secondary | ICD-10-CM | POA: Diagnosis not present

## 2020-03-18 DIAGNOSIS — Z961 Presence of intraocular lens: Secondary | ICD-10-CM | POA: Diagnosis not present

## 2020-03-18 LAB — HM DIABETES EYE EXAM

## 2020-04-03 ENCOUNTER — Telehealth (INDEPENDENT_AMBULATORY_CARE_PROVIDER_SITE_OTHER): Payer: Medicare HMO | Admitting: Nurse Practitioner

## 2020-04-03 ENCOUNTER — Other Ambulatory Visit: Payer: Self-pay | Admitting: Family Medicine

## 2020-04-03 DIAGNOSIS — R059 Cough, unspecified: Secondary | ICD-10-CM | POA: Insufficient documentation

## 2020-04-03 DIAGNOSIS — R05 Cough: Secondary | ICD-10-CM

## 2020-04-03 MED ORDER — DM-GUAIFENESIN ER 30-600 MG PO TB12: 1.0000 | ORAL_TABLET | Freq: Two times a day (BID) | ORAL | 0 refills | Status: DC

## 2020-04-03 NOTE — Assessment & Plan Note (Signed)
Patient is a 69 year old male with cough seen via virtual visit.  Patient reports cough has been present in the last 4 days.  Cough is unproductive, progressively getting worse.  Patient has not used any medication for relief.  Patient denies any dyspnea, hemoptysis, shortness of breath, fever, nausea vomiting, sinus pressure or headaches. Advised patient to stay hydrated, provided phone [phone] patient to call and come through for Covid testing.  Patient has not had COVID-19 vaccination. Guaifenesin started. Education provided to patient. Rx sent to pharmacy

## 2020-04-03 NOTE — Progress Notes (Signed)
   Virtual Visit via telephone Note Due to COVID-19 pandemic this visit was conducted virtually. This visit type was conducted due to national recommendations for restrictions regarding the COVID-19 Pandemic (e.g. social distancing, sheltering in place) in an effort to limit this patient's exposure and mitigate transmission in our community. All issues noted in this document were discussed and addressed.  A physical exam was not performed with this format.  I connected with [name] on 04/03/20 at 08:20am  by telephone and verified that I am speaking with the correct person using two identifiers. Thurlow Gallaga is currently located outside in his car and  is currently by himself during visit. The provider, Ivy Lynn, NP is located in their office at time of visit.  I discussed the limitations, risks, security and privacy concerns of performing an evaluation and management service by telephone and the availability of in person appointments. I also discussed with the patient that there may be a patient responsible charge related to this service. The patient expressed understanding and agreed to proceed.   History and Present Illness:  HPI Cough: Patient complains of nonproductive cough.  Symptoms began 4 days ago.  The cough is non-productive, without wheezing, dyspnea or hemoptysis, without shortness of breath and is aggravated by nothing Associated symptoms include:change in voice. Patient does not have new pets. Patient does not have a history of asthma. Patient does not have a history of environmental allergens. Patient has not recent travel. Patient does not have a history of smoking. Patient  does not have previous Chest X-ray. Patient has not had a PPD done.    Review of Systems  Constitutional: Negative for chills, diaphoresis, fever, malaise/fatigue and weight loss.  HENT: Negative for ear discharge, ear pain, sinus pain and sore throat.   Respiratory: Positive for cough. Negative  for hemoptysis.   All other systems reviewed and are negative.    Observations/Objective: Telehealth visit  Assessment and Plan:  Cough Patient is a 69 year old male with cough seen via virtual visit.  Patient reports cough has been present in the last 4 days.  Cough is unproductive, progressively getting worse.  Patient has not used any medication for relief.  Patient denies any dyspnea, hemoptysis, shortness of breath, fever, nausea vomiting, sinus pressure or headaches. Advised patient to stay hydrated, provided phone [phone] patient to call and come through for Covid testing.  Patient has not had COVID-19 vaccination. Guaifenesin started. Education provided to patient. Rx sent to pharmacy  Follow Up Instructions:   Follow-up with worsening or unresolved symptoms.   I discussed the assessment and treatment plan with the patient. The patient was provided an opportunity to ask questions and all were answered. The patient agreed with the plan and demonstrated an understanding of the instructions.   The patient was advised to call back or seek an in-person evaluation if the symptoms worsen or if the condition fails to improve as anticipated.  The above assessment and management plan was discussed with the patient. The patient verbalized understanding of and has agreed to the management plan. Patient is aware to call the clinic if symptoms persist or worsen. Patient is aware when to return to the clinic for a follow-up visit. Patient educated on when it is appropriate to go to the emergency department.   Time call ended:    I provided 13 minutes of non-face-to-face time during this encounter.    Ivy Lynn, NP

## 2020-04-06 LAB — NOVEL CORONAVIRUS, NAA: SARS-CoV-2, NAA: NOT DETECTED

## 2020-04-16 DIAGNOSIS — R69 Illness, unspecified: Secondary | ICD-10-CM | POA: Diagnosis not present

## 2020-04-30 ENCOUNTER — Other Ambulatory Visit: Payer: Self-pay | Admitting: Family Medicine

## 2020-04-30 ENCOUNTER — Telehealth: Payer: Self-pay

## 2020-04-30 NOTE — Telephone Encounter (Signed)
Patient needed an appointment pt schedule

## 2020-05-08 ENCOUNTER — Ambulatory Visit (INDEPENDENT_AMBULATORY_CARE_PROVIDER_SITE_OTHER): Payer: Medicare HMO | Admitting: Family Medicine

## 2020-05-08 ENCOUNTER — Encounter: Payer: Self-pay | Admitting: Family Medicine

## 2020-05-08 ENCOUNTER — Other Ambulatory Visit: Payer: Self-pay

## 2020-05-08 VITALS — BP 124/72 | HR 69 | Temp 97.8°F | Ht 75.0 in | Wt 285.6 lb

## 2020-05-08 DIAGNOSIS — E1165 Type 2 diabetes mellitus with hyperglycemia: Secondary | ICD-10-CM | POA: Diagnosis not present

## 2020-05-08 DIAGNOSIS — F329 Major depressive disorder, single episode, unspecified: Secondary | ICD-10-CM

## 2020-05-08 DIAGNOSIS — E1169 Type 2 diabetes mellitus with other specified complication: Secondary | ICD-10-CM | POA: Diagnosis not present

## 2020-05-08 DIAGNOSIS — E785 Hyperlipidemia, unspecified: Secondary | ICD-10-CM | POA: Diagnosis not present

## 2020-05-08 DIAGNOSIS — F32A Depression, unspecified: Secondary | ICD-10-CM

## 2020-05-08 DIAGNOSIS — Z89511 Acquired absence of right leg below knee: Secondary | ICD-10-CM

## 2020-05-08 DIAGNOSIS — R809 Proteinuria, unspecified: Secondary | ICD-10-CM

## 2020-05-08 DIAGNOSIS — R3912 Poor urinary stream: Secondary | ICD-10-CM | POA: Diagnosis not present

## 2020-05-08 DIAGNOSIS — R69 Illness, unspecified: Secondary | ICD-10-CM | POA: Diagnosis not present

## 2020-05-08 DIAGNOSIS — E1129 Type 2 diabetes mellitus with other diabetic kidney complication: Secondary | ICD-10-CM

## 2020-05-08 LAB — BAYER DCA HB A1C WAIVED: HB A1C (BAYER DCA - WAIVED): 7.3 % — ABNORMAL HIGH (ref <7.0)

## 2020-05-08 MED ORDER — BUPROPION HCL ER (XL) 150 MG PO TB24: 150.0000 mg | ORAL_TABLET | Freq: Every day | ORAL | 1 refills | Status: DC

## 2020-05-08 NOTE — Progress Notes (Signed)
Subjective: CC: DM2, HLD PCP: Janora Norlander, DO PQD:Rick Lane is a 69 y.o. male, who is accompanied to today's visit by his wife.  He is presenting to clinic today for:  1. Type 2 Diabetes w/ hyperlipidemia/ ?  Depression He is injecting [place] twice daily and 10 units of Novolin R 10-12u q. Meal; Lipitor 20 mg daily.  His wife voices quite a bit of concerned with regards to his uncontrolled diabetes and would like him to see an endocrinologist.  She worries that he is not transparent with her with regards to his uncontrolled diabetes.  He often isolates at home and spends a lot of time on electronics rather than talking to her about things.  When he responds to this he states that "this is just not how things were done in his family".  He does admit to history of depression was previously treated with medications but he did not find them to be helpful and therefore discontinued them.  Upon further evaluation and this medication was Zoloft.  He reports decreased motivation and feeling apathetic.  No SI or HI.  Last eye exam: UTD Last A1c:  Lab Results  Component Value Date   HGBA1C 7.8 (H) 09/25/2019   Nephropathy screen indicated?: on ACE-I due to microalbuminuria  Last flu, zoster and/or pneumovax: Tdap, flu declined again   There is no immunization history on file for this patient.  2.  Straining on urination Patient does report difficulty starting a stream.  He has to put a bit more effort into getting his stream out.  Does not report any hematuria.  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 ([place]) 11/02/2016   Depression    Diabetes mellitus without complication (HCC)    Type II   Hyperlipidemia    Neuropathy     Neuropathy    Sleep apnea     Current Outpatient Medications:    atorvastatin (LIPITOR) 20 MG tablet, Take 1 tablet (20 mg total) by mouth daily., Disp: 90 tablet, Rfl: 3   cholecalciferol (VITAMIN D3) 25 MCG (1000 UNIT) tablet, Take 1,000 Units by mouth daily., Disp: , Rfl:    dextromethorphan-guaiFENesin (MUCINEX DM) 30-600 MG 12hr tablet, Take 1 tablet by mouth 2 (two) times daily., Disp: 30 tablet, Rfl: 0   Insulin Syringe-Needle U-100 (RELION INSULIN SYR 0.3CC/30G) 30G X 5/16" 0.3 ML MISC, 1 Syringe by Does not apply route 3 (three) times daily. Dx E11.42, Disp: 100 each, Rfl: 11   Lecithin 1200 MG CAPS, Take 1,200 mg by mouth daily. Sunflower Lecithin, Disp: , Rfl:    lisinopril (ZESTRIL) 5 MG tablet, Take 0.5 tablets (2.5 mg total) by mouth daily., Disp: 45 tablet, Rfl: 3   Multiple Vitamin (MULTIVITAMIN WITH MINERALS) TABS tablet, Take 1 tablet by mouth daily. Equate Multivitamin for Men 50+, Disp: , Rfl:    NOVOLOG FLEXPEN 100 UNIT/ML FlexPen, Inject 10-12 Units into the skin See admin instructions. Inject 10 units subcutaneously with breakfast &  Inject 12 units subcutaneously with BOTH lunch & supper, Disp: , Rfl:    polyethylene glycol-electrolytes (TRILYTE) 420 g solution, Take 4,000 mLs by mouth as directed., Disp: 4000 mL, Rfl: 0   TRESIBA FLEXTOUCH 200 UNIT/ML FlexTouch Pen, INJECT  48 UNITS SUBCUTANEOUSLY TWICE DAILY . APPOINTMENT REQUIRED FOR FUTURE REFILLS, Disp: 9 mL, Rfl: 0 Social History   Socioeconomic History   Marital status: Married    Spouse name: Not on file   Number of children: 0   Years of education: 14   Highest education level: Not on file  Occupational History   Occupation: Scientist, research (physical sciences)    Comment: [employer]  Tobacco Use   Smoking status: Never Smoker   Smokeless tobacco: Never Used  Scientific laboratory technician Use: Never used  Substance and Sexual Activity   Alcohol use: No   Drug use: No   Sexual activity: Not Currently    Birth  control/protection: None  Other Topics Concern   Not on file  Social History Narrative   Not on file   Social Determinants of Health   Financial Resource Strain:    Difficulty of Paying Living Expenses: Not on file  Food Insecurity:    Worried About Charity fundraiser in the Last Year: Not on file   [employer] of Food in the Last Year: Not on file  Transportation Needs:    Lack of Transportation (Medical): Not on file   Lack of Transportation (Non-Medical): Not on file  Physical Activity:    Days of Exercise per Week: Not on file   Minutes of Exercise per Session: Not on file  Stress:    Feeling of Stress : Not on file  Social Connections:    Frequency of Communication with Friends and Family: Not on file   Frequency of Social Gatherings with Friends and Family: Not on file   Attends Religious Services: Not on file   Active Member of Clubs or Organizations: Not on file   Attends Archivist Meetings: Not on file   Marital Status: Not on file  Intimate Partner Violence:    Fear of Current or Ex-Partner: Not on file   Emotionally Abused: Not on file   Physically Abused: Not on file   Sexually Abused: Not on file   Family History  Problem Relation Age of Onset   Heart disease Father    Diabetes Sister    Diabetes Brother    Heart attack Paternal Grandfather    Colon cancer Maternal Grandfather        passed in the mid-60's.   Liver disease Neg Hx     Objective: Office vital signs reviewed. BP 124/72    Pulse 69    Temp 97.8 F (36.6 C)    Ht 6' 3"  (1.905 m)    Wt 285 lb 9.6 oz (129.5 kg)    SpO2 95%    BMI 35.70 kg/m   Physical Examination:  General: Awake, alert, well nourished, No acute distress HEENT: Normal; sclera white, MMM  Cardio: regular rate and rhythm, S1S2 heard, no murmurs appreciated Pulm: clear to auscultation bilaterally, no wheezes, rhonchi or rales; normal work of breathing on room air Extremities: Surgically  absent right lower extremity; using prosthesis and cane.   Psych: Mood stable, affect somewhat flat but patient is pleasant and interactive Neuro: see DM foot  Diabetic Foot Exam - Simple   Simple Foot Form Diabetic Foot exam was performed with the following findings: Yes 05/08/2020  6:22 PM  Visual Inspection See comments: Yes Sensation Testing See comments: Yes Pulse Check Posterior Tibialis and Dorsalis pulse intact bilaterally: Yes Comments +1 pedal pulse on the left.  Right foot is surgically absent.  He has totally absent monofilament sensation  on the left foot.  No calluses or preulcerative calluses appreciated    Lab Results  Component Value Date   HGBA1C 7.3 (H) 05/08/2020     Assessment/ Plan: 69 y.o. male   1. Uncontrolled type 2 diabetes mellitus with hyperglycemia (HCC) A1c is improving but still not at goal.  His wife wishes for him to see an endocrinologist and he is agreeable.  Referral to endocrinology placed - Bayer DCA Hb A1c Waived - Ambulatory referral to Endocrinology  2. Hyperlipidemia associated with type 2 diabetes mellitus (HCC) Continue statin.  Check labs - CMP14+EGFR - Lipid Panel - Ambulatory referral to Endocrinology  3. Microalbuminuria due to type 2 diabetes mellitus ([place]) On ACE inhibitor - CMP14+EGFR - Ambulatory referral to Endocrinology  4. S/P unilateral BKA (below knee amputation), right ([place]) Foot exam performed.  He has total absent monofilament sensation on the left - Ambulatory referral to Endocrinology  5. Depressive disorder We are going to start Wellbutrin 150 mg daily.  Hopefully this will improve his focus as well as his mood.  I have highly encouraged both he and his wife to consider couples counseling and/or individual counseling as I think this would be very helpful.  They clearly have a lack of communication and connection at this point. - buPROPion (WELLBUTRIN XL) 150 MG 24 hr tablet; Take 1 tablet (150 mg total) by  mouth daily.  Dispense: 30 tablet; Refill: 1  6. Weak urinary stream Referral to urology placed.  Check PSA - PSA - Ambulatory referral to Urology    Orders Placed This Encounter  Procedures   CMP14+EGFR   Lipid Panel   Bayer DCA Hb A1c Waived   PSA   Specimen status report   Ambulatory referral to Urology    Referral Priority:   Routine    Referral Type:   Consultation    Referral Reason:   Specialty Services Required    Requested Specialty:   Urology    Number of Visits Requested:   1   Ambulatory referral to Endocrinology    Referral Priority:   Routine    Referral Type:   Consultation    Referral Reason:   Specialty Services Required    Number of Visits Requested:   1   Meds ordered this encounter  Medications   buPROPion (WELLBUTRIN XL) 150 MG 24 hr tablet    Sig: Take 1 tablet (150 mg total) by mouth daily.    Dispense:  30 tablet    Refill:  R[place], DO Iv[place]e [phone]

## 2020-05-08 NOTE — Patient Instructions (Addendum)
I really think these providers would be of benefit to you and Vaughan Basta.  [facility] in [place], [place] in: [place] Address: [address], [place], [place] [address] Phone: [phone]  Zoloft was the medication Dr Wendi Snipes prescribed previously.  I am sending in Wellbutrin instead.  This has a positive impact on depression and motivation.  Take 1 tablet each morning.  I want to see you back in 1 month for recheck  Referrals are in for urology and endocrinology.  See Almyra Free, our pharmacist, for help with medication affordability.  Bring in your bank statement. She can see Vaughan Basta and you in the same appt probably.

## 2020-05-09 LAB — CMP14+EGFR
ALT: 57 IU/L — ABNORMAL HIGH (ref 0–44)
AST: 27 IU/L (ref 0–40)
Albumin/Globulin Ratio: 2.1 (ref 1.2–2.2)
Albumin: 4.4 g/dL (ref 3.8–4.8)
Alkaline Phosphatase: 44 IU/L (ref 44–121)
BUN/Creatinine Ratio: 18 (ref 10–24)
BUN: 21 mg/dL (ref 8–27)
Bilirubin Total: 0.4 mg/dL (ref 0.0–1.2)
CO2: 24 mmol/L (ref 20–29)
Calcium: 9.8 mg/dL (ref 8.6–10.2)
Chloride: 104 mmol/L (ref 96–106)
Creatinine, Ser: 1.19 mg/dL (ref 0.76–1.27)
GFR calc Af Amer: 72 mL/min/{1.73_m2} (ref >59)
GFR calc non Af Amer: 62 mL/min/{1.73_m2} (ref >59)
Globulin, Total: 2.1 g/dL (ref 1.5–4.5)
Glucose: 177 mg/dL — ABNORMAL HIGH (ref 65–99)
Potassium: 5 mmol/L (ref 3.5–5.2)
Sodium: 140 mmol/L (ref 134–144)
Total Protein: 6.5 g/dL (ref 6.0–8.5)

## 2020-05-09 LAB — LIPID PANEL
Chol/HDL Ratio: 5.6 ratio — ABNORMAL HIGH (ref 0.0–5.0)
Cholesterol, Total: 222 mg/dL — ABNORMAL HIGH (ref 100–199)
HDL: 40 mg/dL (ref >39)
LDL Chol Calc (NIH): 144 mg/dL — ABNORMAL HIGH (ref 0–99)
Triglycerides: 207 mg/dL — ABNORMAL HIGH (ref 0–149)
VLDL Cholesterol Cal: 38 mg/dL (ref 5–40)

## 2020-05-10 LAB — SPECIMEN STATUS REPORT

## 2020-05-13 ENCOUNTER — Telehealth: Payer: Self-pay | Admitting: Orthopedic Surgery

## 2020-05-13 NOTE — Telephone Encounter (Signed)
Called pt and made appt for 05/14/20 @ 4

## 2020-05-13 NOTE — Telephone Encounter (Signed)
Can you please call pt and make an appt. Insurance will not cover an rx without the pt having been seen in the office with in a year.

## 2020-05-13 NOTE — Telephone Encounter (Signed)
Pt called stating his prosthetic doesn't really fit and is starting to cause pain so he would like a rx for a new one or to have it resized  [phone]

## 2020-05-14 ENCOUNTER — Ambulatory Visit: Payer: Medicare HMO | Admitting: Physician Assistant

## 2020-05-15 ENCOUNTER — Ambulatory Visit: Payer: Medicare HMO | Admitting: Pharmacist

## 2020-05-15 ENCOUNTER — Encounter: Payer: Self-pay | Admitting: Family Medicine

## 2020-05-15 DIAGNOSIS — S29011A Strain of muscle and tendon of front wall of thorax, initial encounter: Secondary | ICD-10-CM | POA: Diagnosis not present

## 2020-05-15 DIAGNOSIS — M546 Pain in thoracic spine: Secondary | ICD-10-CM | POA: Diagnosis not present

## 2020-05-21 ENCOUNTER — Other Ambulatory Visit: Payer: Self-pay | Admitting: Family Medicine

## 2020-05-22 ENCOUNTER — Encounter: Payer: Self-pay | Admitting: "Endocrinology

## 2020-05-22 ENCOUNTER — Ambulatory Visit (INDEPENDENT_AMBULATORY_CARE_PROVIDER_SITE_OTHER): Payer: Medicare HMO | Admitting: "Endocrinology

## 2020-05-22 ENCOUNTER — Other Ambulatory Visit: Payer: Self-pay

## 2020-05-22 VITALS — BP 148/80 | HR 71 | Ht 74.5 in | Wt 284.0 lb

## 2020-05-22 DIAGNOSIS — I10 Essential (primary) hypertension: Secondary | ICD-10-CM | POA: Diagnosis not present

## 2020-05-22 DIAGNOSIS — R809 Proteinuria, unspecified: Secondary | ICD-10-CM

## 2020-05-22 DIAGNOSIS — Z794 Long term (current) use of insulin: Secondary | ICD-10-CM

## 2020-05-22 DIAGNOSIS — E782 Mixed hyperlipidemia: Secondary | ICD-10-CM | POA: Diagnosis not present

## 2020-05-22 DIAGNOSIS — E1159 Type 2 diabetes mellitus with other circulatory complications: Secondary | ICD-10-CM

## 2020-05-22 DIAGNOSIS — E559 Vitamin D deficiency, unspecified: Secondary | ICD-10-CM | POA: Diagnosis not present

## 2020-05-22 DIAGNOSIS — E1129 Type 2 diabetes mellitus with other diabetic kidney complication: Secondary | ICD-10-CM

## 2020-05-22 MED ORDER — LISINOPRIL 5 MG PO TABS: 5.0000 mg | ORAL_TABLET | Freq: Every day | ORAL | 1 refills | Status: DC

## 2020-05-22 MED ORDER — ATORVASTATIN CALCIUM 40 MG PO TABS: 40.0000 mg | ORAL_TABLET | Freq: Every day | ORAL | 1 refills | Status: DC

## 2020-05-22 MED ORDER — TRESIBA FLEXTOUCH 200 UNIT/ML ~~LOC~~ SOPN
60.0000 [IU] | PEN_INJECTOR | Freq: Every day | SUBCUTANEOUS | 2 refills | Status: DC
Start: 2020-05-22 — End: 2022-05-17

## 2020-05-22 NOTE — Patient Instructions (Signed)

## 2020-05-22 NOTE — Progress Notes (Signed)
Endocrinology Consult Note       05/22/2020, 4:13 PM   Subjective:    Patient ID: Rick Lane, male    DOB: [birth year].  Rick Lane is being seen in consultation for management of currently uncontrolled symptomatic diabetes requested by  Janora Norlander, DO.   Past Medical History:  Diagnosis Date  . Asthma    ass a child  . Cataract   . 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 Depression   . Diabetes mellitus without complication ([place])    Type II  . Hyperlipidemia   . Neuropathy   . Neuropathy   . Sleep apnea     Past Surgical History:  Procedure Laterality Date  . AMPUTATION Right 12/12/2015   Procedure: Right Transmetatarsal Amputation With [facility] Placement;  Surgeon: [name], MD;  Location: [place];  Service: Orthopedics;  Laterality: Right;  . AMPUTATION Right 03/05/2016   Procedure: RIGHT BELOW KNEE AMPUTATION;  Surgeon: [name], MD;  Location: [place];  Service: Orthopedics;  Laterality: Right;  . CATARACT EXTRACTION W/PHACO Right 10/02/2018   Procedure: CATARACT EXTRACTION PHACO AND INTRAOCULAR LENS PLACEMENT ([place]);  Surgeon: Baruch Goldmann, MD;  Location: [facility];  Service: Ophthalmology;  Laterality: Right;  CDE: 130.53  . COLONOSCOPY    . COLONOSCOPY N/A 12/19/2019   Procedure: COLONOSCOPY;  Surgeon: Daneil Dolin, MD;  Location: [facility];  Service: Endoscopy;  Laterality: N/A;  10:30am  . EYE SURGERY Left    Cataract  . EYE SURGERY Right    r  . ORBITAL RECONSTRUCTION Right   . STUMP REVISION Right 11/10/2016   Procedure: REVISION RIGHT BELOW KNEE AMPUTATION;  Surgeon: [name], MD;  Location: [place];  Service: Orthopedics;  Laterality: Right;  . toe removal Right 2012,2014    Social History   Socioeconomic History  . Marital status: Married    Spouse name: Not on file  . Number of children: 0  . Years of  education: 59  . Highest education level: Not on file  Occupational History  . Occupation: Scientist, research (physical sciences)    Comment: [employer]  Tobacco Use  . Smoking status: Never Smoker  . Smokeless tobacco: Never Used  Vaping Use  . Vaping Use: Never used  Substance and Sexual Activity  . Alcohol use: No  . Drug use: No  . Sexual activity: Not Currently    Birth control/protection: None  Other Topics Concern  . Not on file  Social History Narrative  . Not on file   Social Determinants of Health   Financial Resource Strain:   . Difficulty of Paying Living Expenses: Not on file  Food Insecurity:   . Worried About Charity fundraiser in the Last Year: Not on file  . Ran Out of Food in the Last Year: Not on file  Transportation Needs:   . Lack of Transportation (Medical): Not on file  . Lack of Transportation (Non-Medical): Not on file  Physical Activity:   . Days of Exercise per Week: Not on file  . Minutes of Exercise per Session: Not on file  Stress:   . Feeling of Stress : Not on file  Social Connections:   . Frequency of Communication with Friends and Family: Not on file  . Frequency of Social Gatherings with Friends and Family: Not on file  . Attends Religious Services: Not on file  . Active Member of Clubs or Organizations: Not on file  . Attends Archivist Meetings: Not on file  . Marital Status: Not on file    Family History  Problem Relation Age of Onset  . Heart disease Father   . Diabetes Sister   . Diabetes Brother   . Heart attack Paternal Grandfather   . Colon cancer Maternal Grandfather        passed in the mid-60's.  . Liver disease Neg Hx     Outpatient Encounter Medications as of 05/22/2020  Medication Sig  . atorvastatin (LIPITOR) 40 MG tablet Take 1 tablet (40 mg total) by mouth daily.  Marland Kitchen buPROPion (WELLBUTRIN XL) 150 MG 24 hr tablet Take 1 tablet (150 mg total) by mouth daily.  . cholecalciferol (VITAMIN D3) 25 MCG (1000 UNIT) tablet Take 1,000  Units by mouth daily.  . insulin degludec (TRESIBA FLEXTOUCH) 200 UNIT/ML FlexTouch Pen Inject 60 Units into the skin at bedtime.  . Insulin Syringe-Needle U-100 (RELION INSULIN SYR 0.3CC/30G) 30G X 5/16" 0.3 ML MISC 1 Syringe by Does not apply route 3 (three) times daily. Dx L24.40  . Lecithin 1200 MG CAPS Take 1,200 mg by mouth daily. Sunflower Lecithin  . lisinopril (ZESTRIL) 5 MG tablet Take 1 tablet (5 mg total) by mouth daily.  . Multiple Vitamin (MULTIVITAMIN WITH MINERALS) TABS tablet Take 1 tablet by mouth daily. Equate Multivitamin for Men 69+  . NOVOLOG FLEXPEN 100 UNIT/ML FlexPen Inject 8-14 Units into the skin 3 (three) times daily before meals.  . polyethylene glycol-electrolytes (TRILYTE) 420 g solution Take 4,000 mLs by mouth as directed.  . [DISCONTINUED] atorvastatin (LIPITOR) 20 MG tablet Take 1 tablet (20 mg total) by mouth daily.  . [DISCONTINUED] insulin degludec (TRESIBA FLEXTOUCH) 200 UNIT/ML FlexTouch Pen INJECT 48 UNITS SUBCUTANEOUSLY TWICE DAILY .  . [DISCONTINUED] lisinopril (ZESTRIL) 5 MG tablet Take 0.5 tablets (2.5 mg total) by mouth daily.   No facility-administered encounter medications on file as of 05/22/2020.    ALLERGIES: Allergies  Allergen Reactions  . Bactrim [Sulfamethoxazole-Trimethoprim] Itching  . Gentamicin Other (See Comments)    Unknown    VACCINATION STATUS:  There is no immunization history on file for this patient.  Diabetes He presents for his initial diabetic visit. He has type 2 diabetes mellitus. Onset time: He was diagnosed at approximate age of 6 years. His disease course has been fluctuating. There are no hypoglycemic associated symptoms. Pertinent negatives for hypoglycemia include no confusion, headaches, pallor or seizures. Associated symptoms include blurred vision, foot paresthesias, polydipsia and polyuria. Pertinent negatives for diabetes include no chest pain, no fatigue, no polyphagia and no weakness. There are no  hypoglycemic complications. Symptoms are worsening. Diabetic complications include PVD. (He is status post right below-knee amputation.) Risk factors for coronary artery disease include diabetes mellitus, dyslipidemia, family history, hypertension, male sex, obesity and sedentary lifestyle. Current diabetic treatment includes insulin injections (He is currently on Tresiba 48 units twice daily, NovoLog 10 to 12 units 3 times daily AC.). His weight is fluctuating minimally. He is following a generally unhealthy diet. When asked about meal planning, he reported none. He has not had a previous visit with a dietitian. He rarely participates  in exercise. His home blood glucose trend is fluctuating minimally. (He did not bring any logs nor meter to review.  His recent A1c was 7.3%.  He denies hypoglycemia.  He does not monitor blood glucose any more than 1 time a day.) An ACE inhibitor/angiotensin II receptor blocker is being taken. Eye exam is current.  Hyperlipidemia This is a chronic problem. The current episode started more than 1 year ago. The problem is uncontrolled. Recent lipid tests were reviewed and are high. Exacerbating diseases include diabetes and obesity. Pertinent negatives include no chest pain, myalgias or shortness of breath. Current antihyperlipidemic treatment includes statins. Risk factors for coronary artery disease include diabetes mellitus, dyslipidemia, family history, male sex, obesity, hypertension and a sedentary lifestyle.  Hypertension This is a chronic problem. The current episode started more than 1 year ago. The problem is uncontrolled. Associated symptoms include blurred vision. Pertinent negatives include no chest pain, headaches, neck pain, palpitations or shortness of breath. Risk factors for coronary artery disease include diabetes mellitus, dyslipidemia, family history, obesity, male gender and sedentary lifestyle. Past treatments include ACE inhibitors. Hypertensive end-organ  damage includes PVD.     Review of Systems  Constitutional: Negative for chills, fatigue, fever and unexpected weight change.  HENT: Negative for dental problem, mouth sores and trouble swallowing.   Eyes: Positive for blurred vision. Negative for visual disturbance.  Respiratory: Negative for cough, choking, chest tightness, shortness of breath and wheezing.   Cardiovascular: Negative for chest pain, palpitations and leg swelling.  Gastrointestinal: Negative for abdominal distention, abdominal pain, constipation, diarrhea, nausea and vomiting.  Endocrine: Positive for polydipsia and polyuria. Negative for polyphagia.  Genitourinary: Negative for dysuria, flank pain, hematuria and urgency.  Musculoskeletal: Positive for gait problem. Negative for back pain, myalgias and neck pain.       He ambulates with a cane related to his right below-knee amputation.  Skin: Negative for pallor, rash and wound.  Neurological: Negative for seizures, syncope, weakness, numbness and headaches.  Psychiatric/Behavioral: Negative.  Negative for confusion and dysphoric mood.    Objective:    Vitals with BMI 05/22/2020 05/08/2020 12/19/2019  Height 6' 2.5" 6\' 3"  -  Weight 284 lbs 285 lbs 10 oz -  BMI 99.83 38.2 -  Systolic 505 397 673  Diastolic 80 72 44  Pulse 71 69 76    BP (!) 148/80   Pulse 71   Ht 6' 2.5" (1.892 m)   Wt 284 lb (128.8 kg)   BMI 35.98 kg/m   Wt Readings from Last 3 Encounters:  05/22/20 284 lb (128.8 kg)  05/08/20 285 lb 9.6 oz (129.5 kg)  12/19/19 290 lb (131.5 kg)     Physical Exam Constitutional:      General: He is not in acute distress.    Appearance: He is well-developed.  HENT:     Head: Normocephalic and atraumatic.  Neck:     Thyroid: No thyromegaly.     Trachea: No tracheal deviation.  Cardiovascular:     Pulses:          Dorsalis pedis pulses are 1+ on the right side and 1+ on the left side.       Posterior tibial pulses are 1+ on the right side and 1+  on the left side.     Heart sounds: S1 normal and S2 normal. No murmur heard.  No gallop.   Pulmonary:     Effort: Pulmonary effort is normal. No respiratory distress.     Breath  sounds: No wheezing.  Abdominal:     General: Bowel sounds are normal. There is no distension.     Palpations: Abdomen is soft.     Tenderness: There is no abdominal tenderness. There is no guarding.  Musculoskeletal:     Right shoulder: No swelling or deformity.     Cervical back: Normal range of motion and neck supple.     Comments: He ambulates with a cane related to his right below-knee amputation.  Skin:    General: Skin is warm and dry.     Findings: No rash.     Nails: There is no clubbing.  Neurological:     Mental Status: He is alert and oriented to person, place, and time.     Cranial Nerves: No cranial nerve deficit.     Sensory: No sensory deficit.     Gait: Gait normal.     Deep Tendon Reflexes: Reflexes are normal and symmetric.  Psychiatric:        Speech: Speech normal.        Behavior: Behavior normal. Behavior is cooperative.        Thought Content: Thought content normal.        Judgment: Judgment normal.     CMP ( most recent) CMP     Component Value Date/Time   NA 140 05/08/2020 1028   K 5.0 05/08/2020 1028   CL 104 05/08/2020 1028   CO2 24 05/08/2020 1028   GLUCOSE 177 (H) 05/08/2020 1028   GLUCOSE 123 (H) 09/25/2018 1343   BUN 21 05/08/2020 1028   CREATININE 1.19 05/08/2020 1028   CALCIUM 9.8 05/08/2020 1028   PROT 6.5 05/08/2020 1028   ALBUMIN 4.4 05/08/2020 1028   AST 27 05/08/2020 1028   ALT 57 (H) 05/08/2020 1028   ALKPHOS 44 05/08/2020 1028   BILITOT 0.4 05/08/2020 1028   GFRNONAA 62 05/08/2020 1028   GFRAA 72 05/08/2020 1028     Diabetic Labs (most recent): Lab Results  Component Value Date   HGBA1C 7.3 (H) 05/08/2020   HGBA1C 7.8 (H) 09/25/2019   HGBA1C 6.7 06/27/2019     Lipid Panel ( most recent) Lipid Panel     Component Value Date/Time    CHOL 222 (H) 05/08/2020 1028   TRIG 207 (H) 05/08/2020 1028   HDL 40 05/08/2020 1028   CHOLHDL 5.6 (H) 05/08/2020 1028   LDLCALC 144 (H) 05/08/2020 1028   LDLDIRECT 84 06/27/2019 1339   LABVLDL 38 05/08/2020 1028      Lab Results  Component Value Date   TSH 2.390 08/11/2015   FREET4 0.97 08/11/2015      Assessment & Plan:   1. Type 2 diabetes mellitus with other circulatory complication, with long-term current use of insulin (HCC)  - Jonty Morrical has currently uncontrolled symptomatic type 2 DM since  69 years of age,  with most recent A1c of 7.3 %. Recent labs reviewed. - I had a long discussion with him about the progressive nature of diabetes and the pathology behind its complications. -his diabetes is complicated by peripheral vascular disease complicated by right below-knee amputation, sedentary life, obesity and he remains at a high risk for more acute and chronic complications which include CAD, CVA, CKD, retinopathy, and neuropathy. These are all discussed in detail with him.  - I have counseled him on diet  and weight management  by adopting a carbohydrate restricted/protein rich diet. Patient is encouraged to switch to  unprocessed or minimally processed  complex starch and increased protein intake (animal or plant source), fruits, and vegetables. -  he is advised to stick to a routine mealtimes to eat 3 meals  a day and avoid unnecessary snacks ( to snack only to correct hypoglycemia).   - he admits that there is a room for improvement in his food and drink choices. - Suggestion is made for him to avoid simple carbohydrates  from his diet including Cakes, Sweet Desserts, Ice Cream, Soda (diet and regular), Sweet Tea, Candies, Chips, Cookies, Store Bought Juices, Alcohol in Excess of  1-2 drinks a day, Artificial Sweeteners,  Coffee Creamer, and "Sugar-free" Products. This will help patient to have more stable blood glucose profile and potentially avoid unintended weight  gain.  - he will be scheduled with Jearld Fenton, RDN, CDE for diabetes education.  - I have approached him with the following individualized plan to manage  his diabetes and patient agrees:   -This patient will benefit from simplified treatment regimen.  He is advised to lower his [place] to 60 units nightly, lower his NovoLog to 8  units 3 times a day with meals  for pre-meal BG readings of 90-150mg /dl, plus patient specific correction dose for unexpected hyperglycemia above 150mg /dl, associated with strict monitoring of glucose 4 times a day-before meals and at bedtime. - he is warned not to take insulin without proper monitoring per orders. - Adjustment parameters are given to him for hypo and hyperglycemia in writing. - he is encouraged to call clinic for blood glucose levels less than 70 or above 300 mg /dl. -He will be considered for either treatment modalities including low-dose Metformin and incretin therapy as appropriate next visit.  - Specific targets for  A1c;  LDL, HDL,  and Triglycerides were discussed with the patient.  2) Blood Pressure /Hypertension:  his blood pressure is not controlled to target.   he is advised to increase lisinopril to 5 mg p.o. daily at breakfast.     3) Lipids/Hyperlipidemia:   Review of his recent lipid panel showed un controlled  LDL at 144 .  he  is advised to increase atorvastatin to 40 g p.o. daily at bedtime.    Side effects and precautions discussed with him.  4)  Weight/Diet:  Body mass index is 35.98 kg/m.  -   clearly complicating his diabetes care.   he is  a candidate for weight loss. I discussed with him the fact that loss of 5 - 10% of his  current body weight will have the most impact on his diabetes management.  Exercise, and detailed carbohydrates information provided  -  detailed on discharge instructions.  5) Chronic Care/Health Maintenance:  -he  is on ACEI/ARB and Statin medications and  is encouraged to initiate and continue to  follow up with Ophthalmology, Dentist,  Podiatrist at least yearly or according to recommendations, and advised to   stay away from smoking. I have recommended yearly flu vaccine and pneumonia vaccine at least every 5 years;and  sleep for at least 7 hours a day. -He cannot exercise optimally due to his right leg amputation.  - he is  advised to maintain close follow up with Janora Norlander, DO for primary care needs, as well as his other providers for optimal and coordinated care.   - Time spent in this patient care: 60 min, of which > 50% was spent in  counseling  him about his chronically uncontrolled, complicated type 2 diabetes; hyperlipidemia; hypertension and the rest  reviewing his blood glucose logs , discussing his hypoglycemia and hyperglycemia episodes, reviewing his current and  previous labs / studies  ( including abstraction from other facilities) and medications  doses and developing a  long term treatment plan based on the latest standards of care/ guidelines; and documenting his care.    Please refer to Patient Instructions for Blood Glucose Monitoring and Insulin/Medications Dosing Guide"  in media tab for additional information. Please  also refer to " Patient Self Inventory" in the Media  tab for reviewed elements of pertinent patient history.  Rick Lane participated in the discussions, expressed understanding, and voiced agreement with the above plans.  All questions were answered to his satisfaction. he is encouraged to contact clinic should he have any questions or concerns prior to his return visit.   Follow up plan: - Return in about 1 week (around 05/29/2020) for F/U with Meter and Logs Only - no Labs.  Glade Lloyd, MD [facility] [address] [place], [place] [address] Phone: [phone]  Fax: [phone]    05/22/2020, 4:13 PM  This note was partially dictated with voice recognition software. Similar  sounding words can be transcribed inadequately or may not  be corrected upon review.

## 2020-05-29 ENCOUNTER — Other Ambulatory Visit: Payer: Self-pay

## 2020-05-29 ENCOUNTER — Encounter: Payer: Medicare HMO | Attending: "Endocrinology | Admitting: Nutrition

## 2020-05-29 ENCOUNTER — Ambulatory Visit: Payer: Medicare HMO | Admitting: "Endocrinology

## 2020-05-29 ENCOUNTER — Encounter: Payer: Self-pay | Admitting: Nutrition

## 2020-05-29 VITALS — Ht 74.0 in

## 2020-05-29 VITALS — BP 142/63 | HR 84 | Ht 74.5 in | Wt 287.8 lb

## 2020-05-29 DIAGNOSIS — E118 Type 2 diabetes mellitus with unspecified complications: Secondary | ICD-10-CM | POA: Diagnosis not present

## 2020-05-29 DIAGNOSIS — E782 Mixed hyperlipidemia: Secondary | ICD-10-CM | POA: Insufficient documentation

## 2020-05-29 DIAGNOSIS — E1165 Type 2 diabetes mellitus with hyperglycemia: Secondary | ICD-10-CM | POA: Insufficient documentation

## 2020-05-29 DIAGNOSIS — Z794 Long term (current) use of insulin: Secondary | ICD-10-CM

## 2020-05-29 DIAGNOSIS — E1169 Type 2 diabetes mellitus with other specified complication: Secondary | ICD-10-CM | POA: Insufficient documentation

## 2020-05-29 DIAGNOSIS — E1159 Type 2 diabetes mellitus with other circulatory complications: Secondary | ICD-10-CM

## 2020-05-29 DIAGNOSIS — E559 Vitamin D deficiency, unspecified: Secondary | ICD-10-CM

## 2020-05-29 DIAGNOSIS — Z89511 Acquired absence of right leg below knee: Secondary | ICD-10-CM | POA: Insufficient documentation

## 2020-05-29 DIAGNOSIS — I10 Essential (primary) hypertension: Secondary | ICD-10-CM

## 2020-05-29 DIAGNOSIS — E785 Hyperlipidemia, unspecified: Secondary | ICD-10-CM | POA: Diagnosis not present

## 2020-05-29 DIAGNOSIS — IMO0002 Reserved for concepts with insufficient information to code with codable children: Secondary | ICD-10-CM

## 2020-05-29 NOTE — Patient Instructions (Signed)

## 2020-05-29 NOTE — Progress Notes (Signed)
05/29/2020, 8:32 PM   Endocrinology follow-up note  Subjective:    Patient ID: Rick Lane, male    DOB: 1950/12/28.  Rick Lane is being seen in follow-up after he was seen in consultation for management of currently uncontrolled symptomatic diabetes requested by  Janora Norlander, DO.   Past Medical History:  Diagnosis Date  . Asthma    ass a child  . Cataract   . 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 Depression   . Diabetes mellitus without complication ([place])    Type II  . Hyperlipidemia   . Neuropathy   . Neuropathy   . Sleep apnea     Past Surgical History:  Procedure Laterality Date  . AMPUTATION Right 12/12/2015   Procedure: Right Transmetatarsal Amputation With [facility] Placement;  Surgeon: [name], MD;  Location: [place];  Service: Orthopedics;  Laterality: Right;  . AMPUTATION Right 03/05/2016   Procedure: RIGHT BELOW KNEE AMPUTATION;  Surgeon: [name], MD;  Location: [place];  Service: Orthopedics;  Laterality: Right;  . CATARACT EXTRACTION W/PHACO Right 10/02/2018   Procedure: CATARACT EXTRACTION PHACO AND INTRAOCULAR LENS PLACEMENT ([place]);  Surgeon: Baruch Goldmann, MD;  Location: [facility];  Service: Ophthalmology;  Laterality: Right;  CDE: 130.53  . COLONOSCOPY    . COLONOSCOPY N/A 12/19/2019   Procedure: COLONOSCOPY;  Surgeon: Daneil Dolin, MD;  [facility];  Service: Endoscopy;  Laterality: N/A;  10:30am  . EYE SURGERY Left    Cataract  . EYE SURGERY Right    r  . ORBITAL RECONSTRUCTION Right   . STUMP REVISION Right 11/10/2016   Procedure: REVISION RIGHT BELOW KNEE AMPUTATION;  Surgeon: [name], MD;  Location: [place];  Service: Orthopedics;  Laterality: Right;  . toe removal Right 2012,2014    Social History   Socioeconomic History  . Marital status: Married    Spouse name: Not on file  . Number of  children: 0  . Years of education: 57  . Highest education level: Not on file  Occupational History  . Occupation: Scientist, research (physical sciences)    Comment: [employer]  Tobacco Use  . Smoking status: Never Smoker  . Smokeless tobacco: Never Used  Vaping Use  . Vaping Use: Never used  Substance and Sexual Activity  . Alcohol use: No  . Drug use: No  . Sexual activity: Not Currently    Birth control/protection: None  Other Topics Concern  . Not on file  Social History Narrative  . Not on file   Social Determinants of Health   Financial Resource Strain:   . Difficulty of Paying Living Expenses: Not on file  Food Insecurity:   . Worried About Charity fundraiser in the Last Year: Not on file  . Ran Out of Food in the Last Year: Not on file  Transportation Needs:   . Lack of Transportation (Medical): Not on file  . Lack of Transportation (Non-Medical): Not on file  Physical Activity:   . Days of Exercise per Week: Not on file  . Minutes of Exercise per Session:  Not on file  Stress:   . Feeling of Stress : Not on file  Social Connections:   . Frequency of Communication with Friends and Family: Not on file  . Frequency of Social Gatherings with Friends and Family: Not on file  . Attends Religious Services: Not on file  . Active Member of Clubs or Organizations: Not on file  . Attends Archivist Meetings: Not on file  . Marital Status: Not on file    Family History  Problem Relation Age of Onset  . Heart disease Father   . Diabetes Sister   . Diabetes Brother   . Heart attack Paternal Grandfather   . Colon cancer Maternal Grandfather        passed in the mid-60's.  . Liver disease Neg Hx     Outpatient Encounter Medications as of 05/29/2020  Medication Sig  . atorvastatin (LIPITOR) 40 MG tablet Take 1 tablet (40 mg total) by mouth daily.  Marland Kitchen buPROPion (WELLBUTRIN XL) 150 MG 24 hr tablet Take 1 tablet (150 mg total) by mouth daily.  . cholecalciferol (VITAMIN D3) 25 MCG (1000  UNIT) tablet Take 1,000 Units by mouth daily.  . insulin degludec (TRESIBA FLEXTOUCH) 200 UNIT/ML FlexTouch Pen Inject 60 Units into the skin at bedtime.  . Insulin Syringe-Needle U-100 (RELION INSULIN SYR 0.3CC/30G) 30G X 5/16" 0.3 ML MISC 1 Syringe by Does not apply route 3 (three) times daily. Dx P71.06  . Lecithin 1200 MG CAPS Take 1,200 mg by mouth daily. Sunflower Lecithin  . lisinopril (ZESTRIL) 5 MG tablet Take 1 tablet (5 mg total) by mouth daily.  . Multiple Vitamin (MULTIVITAMIN WITH MINERALS) TABS tablet Take 1 tablet by mouth daily. Equate Multivitamin for Men 50+  . NOVOLOG FLEXPEN 100 UNIT/ML FlexPen Inject 8-14 Units into the skin 3 (three) times daily before meals.  . [DISCONTINUED] polyethylene glycol-electrolytes (TRILYTE) 420 g solution Take 4,000 mLs by mouth as directed.   No facility-administered encounter medications on file as of 05/29/2020.    ALLERGIES: Allergies  Allergen Reactions  . Bactrim [Sulfamethoxazole-Trimethoprim] Itching  . Gentamicin Other (See Comments)    Unknown    VACCINATION STATUS:  There is no immunization history on file for this patient.  Diabetes He presents for his follow-up diabetic visit. He has type 2 diabetes mellitus. Onset time: He was diagnosed at approximate age of 89 years. His disease course has been improving. There are no hypoglycemic associated symptoms. Pertinent negatives for hypoglycemia include no confusion, headaches, pallor or seizures. Associated symptoms include blurred vision and foot paresthesias. Pertinent negatives for diabetes include no chest pain, no fatigue, no polydipsia, no polyphagia, no polyuria and no weakness. There are no hypoglycemic complications. Symptoms are improving. Diabetic complications include PVD. (He is status post right below-knee amputation.) Risk factors for coronary artery disease include diabetes mellitus, dyslipidemia, family history, hypertension, male sex, obesity and sedentary  lifestyle. Current diabetic treatment includes insulin injections (He is currently on Tresiba 48 units twice daily, NovoLog 10 to 12 units 3 times daily AC.). His weight is fluctuating minimally. He is following a generally unhealthy diet. When asked about meal planning, he reported none. He has not had a previous visit with a dietitian. He rarely participates in exercise. His home blood glucose trend is decreasing steadily. His breakfast blood glucose range is generally 110-130 mg/dl. His lunch blood glucose range is generally 110-130 mg/dl. His dinner blood glucose range is generally 130-140 mg/dl. His bedtime blood glucose range is generally 130-140  mg/dl. His overall blood glucose range is 130-140 mg/dl. (He presents with remarkably reversed and near target glycemic profile.  His recent A1c was 7.3%.  He denies hypoglycemia.   ) An ACE inhibitor/angiotensin II receptor blocker is being taken. Eye exam is current.  Hyperlipidemia This is a chronic problem. The current episode started more than 1 year ago. The problem is uncontrolled. Recent lipid tests were reviewed and are high. Exacerbating diseases include diabetes and obesity. Pertinent negatives include no chest pain, myalgias or shortness of breath. Current antihyperlipidemic treatment includes statins. Risk factors for coronary artery disease include diabetes mellitus, dyslipidemia, family history, male sex, obesity, hypertension and a sedentary lifestyle.  Hypertension This is a chronic problem. The current episode started more than 1 year ago. The problem is uncontrolled. Associated symptoms include blurred vision. Pertinent negatives include no chest pain, headaches, neck pain, palpitations or shortness of breath. Risk factors for coronary artery disease include diabetes mellitus, dyslipidemia, family history, obesity, male gender and sedentary lifestyle. Past treatments include ACE inhibitors. Hypertensive end-organ damage includes PVD.      Review of Systems  Constitutional: Negative for chills, fatigue, fever and unexpected weight change.  HENT: Negative for dental problem, mouth sores and trouble swallowing.   Eyes: Positive for blurred vision. Negative for visual disturbance.  Respiratory: Negative for cough, choking, chest tightness, shortness of breath and wheezing.   Cardiovascular: Negative for chest pain, palpitations and leg swelling.  Gastrointestinal: Negative for abdominal distention, abdominal pain, constipation, diarrhea, nausea and vomiting.  Endocrine: Negative for polydipsia, polyphagia and polyuria.  Genitourinary: Negative for dysuria, flank pain, hematuria and urgency.  Musculoskeletal: Positive for gait problem. Negative for back pain, myalgias and neck pain.       He ambulates with a cane related to his right below-knee amputation.  Skin: Negative for pallor, rash and wound.  Neurological: Negative for seizures, syncope, weakness, numbness and headaches.  Psychiatric/Behavioral: Negative.  Negative for confusion and dysphoric mood.    Objective:    Vitals with BMI 05/29/2020 05/29/2020 05/22/2020  Height 6\' 2"  6' 2.5" 6' 2.5"  Weight - 287 lbs 13 oz 284 lbs  BMI - 86.57 84.69  Systolic - 629 528  Diastolic - 63 80  Pulse - 84 71    BP (!) 142/63   Pulse 84   Ht 6' 2.5" (1.892 m)   Wt 287 lb 12.8 oz (130.5 kg)   BMI 36.46 kg/m   Wt Readings from Last 3 Encounters:  05/29/20 287 lb 12.8 oz (130.5 kg)  05/22/20 284 lb (128.8 kg)  05/08/20 285 lb 9.6 oz (129.5 kg)     Physical Exam Constitutional:      General: He is not in acute distress.    Appearance: He is well-developed.  HENT:     Head: Normocephalic and atraumatic.  Neck:     Thyroid: No thyromegaly.     Trachea: No tracheal deviation.  Cardiovascular:     Pulses:          Dorsalis pedis pulses are 1+ on the right side and 1+ on the left side.       Posterior tibial pulses are 1+ on the right side and 1+ on the left side.      Heart sounds: S1 normal and S2 normal. No murmur heard.  No gallop.   Pulmonary:     Effort: Pulmonary effort is normal. No respiratory distress.     Breath sounds: No wheezing.  Abdominal:  General: Bowel sounds are normal. There is no distension.     Palpations: Abdomen is soft.     Tenderness: There is no abdominal tenderness. There is no guarding.  Musculoskeletal:     Right shoulder: No swelling or deformity.     Cervical back: Normal range of motion and neck supple.     Comments: He ambulates with a cane related to his right below-knee amputation.  Skin:    General: Skin is warm and dry.     Findings: No rash.     Nails: There is no clubbing.  Neurological:     Mental Status: He is alert and oriented to person, place, and time.     Cranial Nerves: No cranial nerve deficit.     Sensory: No sensory deficit.     Gait: Gait normal.     Deep Tendon Reflexes: Reflexes are normal and symmetric.  Psychiatric:        Speech: Speech normal.        Behavior: Behavior normal. Behavior is cooperative.        Thought Content: Thought content normal.        Judgment: Judgment normal.     CMP ( most recent) CMP     Component Value Date/Time   NA 140 05/08/2020 1028   K 5.0 05/08/2020 1028   CL 104 05/08/2020 1028   CO2 24 05/08/2020 1028   GLUCOSE 177 (H) 05/08/2020 1028   GLUCOSE 123 (H) 09/25/2018 1343   BUN 21 05/08/2020 1028   CREATININE 1.19 05/08/2020 1028   CALCIUM 9.8 05/08/2020 1028   PROT 6.5 05/08/2020 1028   ALBUMIN 4.4 05/08/2020 1028   AST 27 05/08/2020 1028   ALT 57 (H) 05/08/2020 1028   ALKPHOS 44 05/08/2020 1028   BILITOT 0.4 05/08/2020 1028   GFRNONAA 62 05/08/2020 1028   GFRAA 72 05/08/2020 1028     Diabetic Labs (most recent): Lab Results  Component Value Date   HGBA1C 7.3 (H) 05/08/2020   HGBA1C 7.8 (H) 09/25/2019   HGBA1C 6.7 06/27/2019     Lipid Panel ( most recent) Lipid Panel     Component Value Date/Time   CHOL 222 (H)  05/08/2020 1028   TRIG 207 (H) 05/08/2020 1028   HDL 40 05/08/2020 1028   CHOLHDL 5.6 (H) 05/08/2020 1028   LDLCALC 144 (H) 05/08/2020 1028   LDLDIRECT 84 06/27/2019 1339   LABVLDL 38 05/08/2020 1028      Lab Results  Component Value Date   TSH 2.390 08/11/2015   FREET4 0.97 08/11/2015      Assessment & Plan:   1. Type 2 diabetes mellitus with other circulatory complication, with long-term current use of insulin (HCC)  - Rick Lane has currently controlled asymptomatic type 2 DM since  69 years of age,  with most recent A1c of 7.3 %. Since his last visit, he presents with near target glycemic profile.  No hypoglycemia.  Recent labs reviewed. - I had a long discussion with him about the progressive nature of diabetes and the pathology behind its complications. -his diabetes is complicated by peripheral vascular disease complicated by right below-knee amputation, sedentary life, obesity and he remains at a high risk for more acute and chronic complications which include CAD, CVA, CKD, retinopathy, and neuropathy. These are all discussed in detail with him.  - I have counseled him on diet  and weight management  by adopting a carbohydrate restricted/protein rich diet. Patient is encouraged to switch to  unprocessed or minimally processed     complex starch and increased protein intake (animal or plant source), fruits, and vegetables. -  he is advised to stick to a routine mealtimes to eat 3 meals  a day and avoid unnecessary snacks ( to snack only to correct hypoglycemia).   - he  admits there is a room for improvement in his diet and drink choices. -  Suggestion is made for him to avoid simple carbohydrates  from his diet including Cakes, Sweet Desserts / Pastries, Ice Cream, Soda (diet and regular), Sweet Tea, Candies, Chips, Cookies, Sweet Pastries,  Store Bought Juices, Alcohol in Excess of  1-2 drinks a day, Artificial Sweeteners, Coffee Creamer, and "Sugar-free" Products. This  will help patient to have stable blood glucose profile and potentially avoid unintended weight gain.  - he will be scheduled with Jearld Fenton, RDN, CDE for diabetes education.  - I have approached him with the following individualized plan to manage  his diabetes and patient agrees:   -This patient has benefited from the escalated and simplified treatment regimen.   -He declines an offer for  CGM device.   -He is advised to continue Tresiba 60 units nightly, continue NovoLog 8 units 3 times a day with meals  for pre-meal BG readings of 90-150mg /dl, plus patient specific correction dose for unexpected hyperglycemia above 150mg /dl, associated with strict monitoring of glucose 4 times a day-before meals and at bedtime. - he is warned not to take insulin without proper monitoring per orders. - Adjustment parameters are given to him for hypo and hyperglycemia in writing. - he is encouraged to call clinic for blood glucose levels less than 70 or above 300 mg /dl. -He does not tolerate Metformin.   -He will be considered for incretin therapy as appropriate on subsequent visits.    - Specific targets for  A1c;  LDL, HDL,  and Triglycerides were discussed with the patient.  2) Blood Pressure /Hypertension: His blood pressure is controlled to target. he is advised to increase lisinopril to 5 mg p.o. daily at breakfast.     3) Lipids/Hyperlipidemia:   Review of his recent lipid panel showed un controlled  LDL at 144 .  he  is advised to continue atorvastatin 40 mg p.o. daily at bedtime.  Side effects and precautions discussed with him.  4)  Weight/Diet:  Body mass index is 36.46 kg/m.  -   clearly complicating his diabetes care.   he is  a candidate for weight loss. I discussed with him the fact that loss of 5 - 10% of his  current body weight will have the most impact on his diabetes management.  Exercise, and detailed carbohydrates information provided  -  detailed on discharge instructions.  5)  Chronic Care/Health Maintenance:  -he  is on ACEI/ARB and Statin medications and  is encouraged to initiate and continue to follow up with Ophthalmology, Dentist,  Podiatrist at least yearly or according to recommendations, and advised to   stay away from smoking. I have recommended yearly flu vaccine and pneumonia vaccine at least every 5 years;and  sleep for at least 7 hours a day. -He cannot exercise optimally due to his right leg amputation.  - he is  advised to maintain close follow up with Janora Norlander, DO for primary care needs, as well as his other providers for optimal and coordinated care.  - Time spent on this patient care encounter:  35 min, of which > 50% was spent  in  counseling and the rest reviewing his blood glucose logs , discussing his hypoglycemia and hyperglycemia episodes, reviewing his current and  previous labs / studies  ( including abstraction from other facilities) and medications  doses and developing a  long term treatment plan and documenting his care.   Please refer to Patient Instructions for Blood Glucose Monitoring and Insulin/Medications Dosing Guide"  in media tab for additional information. Please  also refer to " Patient Self Inventory" in the Media  tab for reviewed elements of pertinent patient history.  Rick Lane participated in the discussions, expressed understanding, and voiced agreement with the above plans.  All questions were answered to his satisfaction. he is encouraged to contact clinic should he have any questions or concerns prior to his return visit.    Follow up plan: - Return in about 3 months (around 08/29/2020) for Urine MA - NV, Bring Meter and Logs- A1c in Office.  Glade Lloyd, MD [facility] [address] [place], [place] [address] Phone: [phone]  Fax: [phone]    05/29/2020, 8:32 PM  This note was partially dictated with voice recognition software. Similar  sounding words can be transcribed inadequately or may not  be corrected upon review.

## 2020-05-29 NOTE — Patient Instructions (Addendum)
Goals  Eat three meals a day on time Drink water only  Cut out snacks and desserts Try chair exercises and ask for PT referral Prevent low blood sugars. Get A1C to 6.5% or less.

## 2020-05-29 NOTE — Progress Notes (Signed)
°  Medical Nutrition Therapy:  Appt start time: 1500 end time:  2449.   Assessment:  Primary concerns today: Diabetes Type 2. His wife Vaughan Basta is here with him. PMH Hyperlipidemia, BKA, HTN and Obesity. Tresiba 60 units once a day now. Novolog 8 units with meals and sliding scale. Right BKA.  HIs wife is driving him around. Doesn't drive anymore. Willing to make changes with his diet to improve his health and lose weight. Limited mobility but willing to try chair exercises..   Lab Results  Component Value Date   HGBA1C 7.3 (H) 05/08/2020   CMP Latest Ref Rng & Units 05/08/2020 10/24/2019 09/25/2019  Glucose 65 - 99 mg/dL 177(H) - -  BUN 8 - 27 mg/dL 21 - -  Creatinine 0.76 - 1.27 mg/dL 1.19 - -  Sodium 134 - 144 mmol/L 140 - -  Potassium 3.5 - 5.2 mmol/L 5.0 - -  Chloride 96 - 106 mmol/L 104 - -  CO2 20 - 29 mmol/L 24 - -  Calcium 8.6 - 10.2 mg/dL 9.8 - -  Total Protein 6.0 - 8.5 g/dL 6.5 6.8 6.4  Total Bilirubin 0.0 - 1.2 mg/dL 0.4 0.6 0.4  Alkaline Phos 44 - 121 IU/L 44 - 48  AST 0 - 40 IU/L 27 29 30   ALT 0 - 44 IU/L 57(H) 47(H) 54(H)   Lipid Panel     Component Value Date/Time   CHOL 222 (H) 05/08/2020 1028   TRIG 207 (H) 05/08/2020 1028   HDL 40 05/08/2020 1028   CHOLHDL 5.6 (H) 05/08/2020 1028   LDLCALC 144 (H) 05/08/2020 1028   LDLDIRECT 84 06/27/2019 1339   LABVLDL 38 05/08/2020 1028    Preferred Learning Style:   No preference indicated   Learning Readiness:   Ready  Change in progress   MEDICATIONS:   DIETARY INTAKE: .    24-hr recall:  B ( AM): Eggs 3, cheese, coffee, Lunch [place] and cheese and 1 slice bread, pickles.  Dinner 2 [place] burgers, spinach, Coffee  Usual physical activity: ADL   Estimated energy needs: 1500 calories 170 g carbohydrates 112 g protein 42 g fat  Progress Towards Goal(s):  In progress.   Nutritional Diagnosis:  NB-1.1 Food and nutrition-related knowledge deficit As related to Diabetes Type 2.  As evidenced by A1C  7.%     Intervention:  Nutrition and Diabetes education provided on My Plate, CHO counting, meal planning, portion sizes, timing of meals, avoiding snacks between meals unless having a low blood sugar, target ranges for A1C and blood sugars, signs/symptoms and treatment of hyper/hypoglycemia, monitoring blood sugars, taking medications as prescribed, benefits of exercising 30 minutes per day and prevention of complications of DM.  Goals  Eat three meals a day on time Drink water only Cut out snacks and desserts Try chair exercises and ask for PT referral Prevent low blood sugars.  Teaching Method Utilized:  Visual Auditory Hands on  Handouts given during visit include:  The Plate Method    Meal Plan Card  Diabetes Instructions.   Barriers to learning/adherence to lifestyle change: none  Demonstrated degree of understanding via:  Teach Back   Monitoring/Evaluation:  Dietary intake, exercise,  and body weight in 1 month(s).

## 2020-05-30 ENCOUNTER — Ambulatory Visit: Payer: Medicare HMO | Admitting: Physician Assistant

## 2020-05-30 ENCOUNTER — Encounter: Payer: Self-pay | Admitting: Physician Assistant

## 2020-05-30 DIAGNOSIS — Z89511 Acquired absence of right leg below knee: Secondary | ICD-10-CM | POA: Diagnosis not present

## 2020-05-30 DIAGNOSIS — G8929 Other chronic pain: Secondary | ICD-10-CM | POA: Diagnosis not present

## 2020-05-30 NOTE — Progress Notes (Signed)
Office Visit Note   Patient: Rick Lane           Date of Birth: 13-Dec-1950           MRN: [id number] Visit Date: 05/30/2020              Requested by: Janora Norlander, DO [place],  [place] [address] PCP: Janora Norlander, DO  No chief complaint on file.     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  He comes in today because his stump has a blister on it as well as a couple areas of pressure.  He thinks this may be because the prosthetic is not fitting appropriately.  He comes in for a prescription for a new prosthetic.  Assessment & Plan: Visit Diagnoses: No diagnosis found.  Plan: Discussed with the patient that he needs to be off his prosthetic as much as possible.  He should be wearing a 2 XL shrinker and have given him a prescription for this.  In the meantime I given a prescription for him to work with [employer] to fashion a new prosthetic.  I asked that the patient wear a vive shrinker against the skin. Patient is an existing right transtibial  amputee.  Patient's current comorbidities are not expected to impact the ability to function with the prescribed prosthesis. Patient verbally communicates a strong desire to use a prosthesis. Patient currently requires mobility aids to ambulate without a prosthesis.  Expects not to use mobility aids with a new prosthesis.  Patient is a K2 level ambulator that will use a prosthesis to walk around their home and the community over low level environmental barriers.    Follow-Up Instructions: No follow-ups on file.   Ortho Exam  Patient is alert, oriented, no adenopathy, well-dressed, normal affect, normal respiratory effort. Focused examination of his amputation stump on the medial distal side of the stump he does have a 3 cm blister that is intact.  He also has some pressure places on the anterior stump and lateral stump.  Evidence of ill  fitting prosthetic.  No cellulitis no foul odor has a well-healed surgical incision has no swelling.  Obvious has lost volume  Imaging: No results found. No images are attached to the encounter.  Labs: Lab Results  Component Value Date   HGBA1C 7.3 (H) 05/08/2020   HGBA1C 7.8 (H) 09/25/2019   HGBA1C 6.7 06/27/2019     Lab Results  Component Value Date   ALBUMIN 4.4 05/08/2020   ALBUMIN 4.2 09/25/2019   ALBUMIN 4.6 06/27/2019    No results found for: MG Lab Results  Component Value Date   VD25OH 37.4 04/28/2017    No results found for: PREALBUMIN CBC EXTENDED Latest Ref Rng & Units 10/24/2019 04/28/2017 11/10/2016  WBC 3.8 - 10.8 Thousand/uL 5.9 4.9 6.3  RBC 4.20 - 5.80 Million/uL 5.00 4.61 4.63  HGB 13.2 - 17.1 g/dL 15.4 13.4 13.6  HCT 38 - 50 % 46.0 41.4 41.3  PLT 140 - 400 Thousand/uL 193 184 192  NEUTROABS 1,500 - 7,800 cells/uL 3,693 3.1 -  LYMPHSABS 850 - 3,900 cells/uL 1,410 1.1 -     There is no height or weight on file to calculate BMI.  Orders:  No orders of the defined types were placed in this encounter.  No orders of the defined types were placed in this encounter.    Procedures: No procedures performed  Clinical Data: No additional findings.  ROS:  All other systems negative, except as noted in the HPI. Review of Systems  Objective: Vital Signs: There were no vitals taken for this visit.  Specialty Comments:  No specialty comments available.  PMFS History: Patient Active Problem List   Diagnosis Date Noted  . Mixed hyperlipidemia 05/22/2020  . Essential hypertension, benign 05/22/2020  . Cough 04/03/2020  . Preventative health care 10/30/2019  . Elevated LFTs 10/30/2019  . Microalbuminuria due to type 2 diabetes mellitus ([place]) 03/27/2019  . Diabetic ulcer of left midfoot associated with type 2 diabetes mellitus, with fat layer exposed ([place]) 10/10/2018  . Influenza vaccine refused 05/29/2018  . Chest discomfort 11/27/2016  . DOE  (dyspnea on exertion) 11/25/2016  . Below knee amputation status 03/05/2016  . S/P unilateral BKA (below knee amputation), right ([place]) 12/12/2015  . Healthcare maintenance 11/14/2015  . Hyperlipidemia associated with type 2 diabetes mellitus ([place]) 11/14/2015  . Diabetes mellitus ([place]) 08/11/2015  . Depression 08/11/2015  . Vitamin D deficiency 08/11/2015  . Asthma 08/11/2015   Past Medical History:  Diagnosis Date  . Asthma    ass a child  . Cataract   . 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 Depression   . Diabetes mellitus without complication ([place])    Type II  . Hyperlipidemia   . Neuropathy   . Neuropathy   . Sleep apnea     Family History  Problem Relation Age of Onset  . Heart disease Father   . Diabetes Sister   . Diabetes Brother   . Heart attack Paternal Grandfather   . Colon cancer Maternal Grandfather        passed in the mid-60's.  . Liver disease Neg Hx     Past Surgical History:  Procedure Laterality Date  . AMPUTATION Right 12/12/2015   Procedure: Right Transmetatarsal Amputation With [facility] Placement;  Surgeon: [name], MD;  Location: [facility];  Service: Orthopedics;  Laterality: Right;  . AMPUTATION Right 03/05/2016   Procedure: RIGHT BELOW KNEE AMPUTATION;  Surgeon: [name], MD;  Location: [facility];  Service: Orthopedics;  Laterality: Right;  . CATARACT EXTRACTION W/PHACO Right 10/02/2018   Procedure: CATARACT EXTRACTION PHACO AND INTRAOCULAR LENS PLACEMENT ([place]);  Surgeon: Baruch Goldmann, MD;  [facility];  Service: Ophthalmology;  Laterality: Right;  CDE: 130.53  . COLONOSCOPY    . COLONOSCOPY N/A 12/19/2019   Procedure: COLONOSCOPY;  Surgeon: Daneil Dolin, MD;  [facility];  Service: Endoscopy;  Laterality: N/A;  10:30am  . EYE SURGERY Left    Cataract  . EYE SURGERY Right    r  . ORBITAL RECONSTRUCTION Right   . STUMP REVISION Right 11/10/2016   Procedure: REVISION RIGHT BELOW KNEE  AMPUTATION;  Surgeon: [name], MD;  Location: [facility];  Service: Orthopedics;  Laterality: Right;  . toe removal Right 2012,2014   Social History   Occupational History  . Occupation: Scientist, research (physical sciences)    Comment: [employer]  Tobacco Use  . Smoking status: Never Smoker  . Smokeless tobacco: Never Used  Vaping Use  . Vaping Use: Never used  Substance and Sexual Activity  . Alcohol use: No  . Drug use: No  . Sexual activity: Not Currently    Birth control/protection: None

## 2020-06-04 ENCOUNTER — Encounter: Payer: Self-pay | Admitting: Nutrition

## 2020-06-13 ENCOUNTER — Ambulatory Visit: Payer: Medicare HMO | Admitting: Family

## 2020-06-13 ENCOUNTER — Encounter: Payer: Self-pay | Admitting: Family

## 2020-06-13 DIAGNOSIS — Z89511 Acquired absence of right leg below knee: Secondary | ICD-10-CM

## 2020-06-13 NOTE — Progress Notes (Signed)
Office Visit Note   Patient: Rick Lane           Date of Birth: 06-Nov-1950           MRN: [id number] Visit Date: 06/13/2020              Requested by: Janora Norlander, DO [place],   [id number] PCP: Janora Norlander, DO  Chief Complaint  Patient presents with  . Right Leg - Follow-up      HPI: The patient is a 69 year old gentleman who presents today in routine follow-up for ulcer to his right below-knee amputation.  He is about 3 years out from his amputation his current socket is ill fitting.  He is following with [employer] states was fitted for new socket yesterday but has lost his prescription  Assessment & Plan: Visit Diagnoses: No diagnosis found.  Plan: Ulcer nearly healed.  He will continue with his current wound care and progressed to his new socket as soon as it is fabricated follow-up in the office as needed  Follow-Up Instructions: No follow-ups on file.   Ortho Exam  Patient is alert, oriented, no adenopathy, well-dressed, normal affect, normal respiratory effort. On examination of the right residual limb this is well consolidated incision well-healed he does have 1 medial ulcer that is very superficial covered with eschar this is now 8 mm in diameter there is no surrounding erythema drainage or maceration  Imaging: No results found. No images are attached to the encounter.  Labs: Lab Results  Component Value Date   HGBA1C 7.3 (H) 05/08/2020   HGBA1C 7.8 (H) 09/25/2019   HGBA1C 6.7 06/27/2019     Lab Results  Component Value Date   ALBUMIN 4.4 05/08/2020   ALBUMIN 4.2 09/25/2019   ALBUMIN 4.6 06/27/2019    No results found for: MG Lab Results  Component Value Date   VD25OH 37.4 04/28/2017    No results found for: PREALBUMIN CBC EXTENDED Latest Ref Rng & Units 10/24/2019 04/28/2017 11/10/2016  WBC 3.8 - 10.8 Thousand/uL 5.9 4.9 6.3  RBC 4.20 - 5.80 Million/uL 5.00 4.61 4.63  HGB 13.2 - 17.1 g/dL 15.4 13.4 13.6  HCT 38 -  50 % 46.0 41.4 41.3  PLT 140 - 400 Thousand/uL 193 184 192  NEUTROABS 1,500 - 7,800 cells/uL 3,693 3.1 -  LYMPHSABS 850 - 3,900 cells/uL 1,410 1.1 -     There is no height or weight on file to calculate BMI.  Orders:  No orders of the defined types were placed in this encounter.  No orders of the defined types were placed in this encounter.    Procedures: No procedures performed  Clinical Data: No additional findings.  ROS:  All other systems negative, except as noted in the HPI. Review of Systems  Objective: Vital Signs: There were no vitals taken for this visit.  Specialty Comments:  No specialty comments available.  PMFS History: Patient Active Problem List   Diagnosis Date Noted  . Mixed hyperlipidemia 05/22/2020  . Essential hypertension, benign 05/22/2020  . Cough 04/03/2020  . Preventative health care 10/30/2019  . Elevated LFTs 10/30/2019  . Microalbuminuria due to type 2 diabetes mellitus ([place]) 03/27/2019  . Diabetic ulcer of left midfoot associated with type 2 diabetes mellitus, with fat layer exposed ([place]) 10/10/2018  . Influenza vaccine refused 05/29/2018  . Chest discomfort 11/27/2016  . DOE (dyspnea on exertion) 11/25/2016  . Below knee amputation status 03/05/2016  . S/P unilateral BKA (below  knee amputation), right ([place]) 12/12/2015  . Healthcare maintenance 11/14/2015  . Hyperlipidemia associated with type 2 diabetes mellitus ([place]) 11/14/2015  . Diabetes mellitus ([place]) 08/11/2015  . Depression 08/11/2015  . Vitamin D deficiency 08/11/2015  . Asthma 08/11/2015   Past Medical History:  Diagnosis Date  . Asthma    ass a child  . Cataract   . 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 Depression   . Diabetes mellitus without complication ([place])    Type II  . Hyperlipidemia   . Neuropathy   . Neuropathy   . Sleep apnea     Family History  Problem Relation Age of Onset  . Heart disease Father   .  Diabetes Sister   . Diabetes Brother   . Heart attack Paternal Grandfather   . Colon cancer Maternal Grandfather        passed in the mid-60's.  . Liver disease Neg Hx     Past Surgical History:  Procedure Laterality Date  . AMPUTATION Right 12/12/2015   Procedure: Right Transmetatarsal Amputation With [facility] Placement;  Surgeon: [name], MD;  Location: [facility];  Service: Orthopedics;  Laterality: Right;  . AMPUTATION Right 03/05/2016   Procedure: RIGHT BELOW KNEE AMPUTATION;  Surgeon: [name], MD;  Location: [facility];  Service: Orthopedics;  Laterality: Right;  . CATARACT EXTRACTION W/PHACO Right 10/02/2018   Procedure: CATARACT EXTRACTION PHACO AND INTRAOCULAR LENS PLACEMENT ([place]);  Surgeon: Baruch Goldmann, MD;  [facility];  Service: Ophthalmology;  Laterality: Right;  CDE: 130.53  . COLONOSCOPY    . COLONOSCOPY N/A 12/19/2019   Procedure: COLONOSCOPY;  Surgeon: Daneil Dolin, MD;  [facility];  Service: Endoscopy;  Laterality: N/A;  10:30am  . EYE SURGERY Left    Cataract  . EYE SURGERY Right    r  . ORBITAL RECONSTRUCTION Right   . STUMP REVISION Right 11/10/2016   Procedure: REVISION RIGHT BELOW KNEE AMPUTATION;  Surgeon: [name], MD;  Location: [facility];  Service: Orthopedics;  Laterality: Right;  . toe removal Right 2012,2014   Social History   Occupational History  . Occupation: Scientist, research (physical sciences)    Comment: [employer]  Tobacco Use  . Smoking status: Never Smoker  . Smokeless tobacco: Never Used  Vaping Use  . Vaping Use: Never used  Substance and Sexual Activity  . Alcohol use: No  . Drug use: No  . Sexual activity: Not Currently    Birth control/protection: None

## 2020-06-14 NOTE — Progress Notes (Signed)
Referring Provider: Janora Norlander, DO Primary Care Physician:  Janora Norlander, DO Primary GI Physician: Dr. Gala Romney  Chief Complaint  Patient presents with   elevated lft    HPI:   Rick Lane is a 69 y.o. male presenting today for follow-up of elevated LFTs and s/p colonoscopy for colon cancer screening.  Patient was last seen in our office 10/24/2019 at the request of PCP for elevated LFTs.  ALT elevation dates back at least to 2017.  Brief normalization, but ALT became elevated again in 2019 and has persisted.  Labs prior to office visit in March 2021 with ALT 54 (ULN is 44).  AST normal at 30, alk phos normal at 48, bilirubin normal at 0.4.  The time of his office visit, he had no significant upper or lower GI symptoms.  No alarm symptoms.  Plan to complete additional serologic evaluation of elevated LFTs, RUQ ultrasound, and proceed with colonoscopy.  Follow-up in 3 months.  Labs completed 10/24/2019: CBC WNL, HFP remarkable for ALT 47 otherwise normal, no immunity to hepatitis A, hepatitis B negative  without immunity, hepatitis C negative, ferritin WNL, ceruloplasmin WNL, AMA negative, ASMA negative, ANA positive with 1:40 titer, cytoplasmic pattern.   RUQ ultrasound 10/31/2019: Hepatomegaly and increased echogenicity of liver parenchyma likely reflecting diffuse steatosis.  Suspected fatty liver was etiology of elevated ALT.  Colonoscopy 12/19/2019: Diverticulosis in the sigmoid and descending colon, redundant colon, otherwise normal exam.  No recommendations to repeat due to age.  Most recent labs 05/08/2020 with ALT 57.  Today: No GI concerns. No abdominal pain. No swelling in abdomen or LE. No confusion or changes in mental status. No bruising, bleeding, or yellowing of the eyes. Bowels moving well. No blood in the stool or blacks tools. No GERD symptoms, nausea, vomiting, or dysphagia.   No OTC supplements. No Tylenol. No alcohol or illicit drug use.   Just resumed  Lipitor about 3 months ago. Had been off for about 6 months.   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 ([place]) 11/02/2016   Depression    Diabetes mellitus without complication ([facility])    Type II   Hyperlipidemia    Neuropathy    Neuropathy    Sleep apnea     Past Surgical History:  Procedure Laterality Date   AMPUTATION Right 12/12/2015   Procedure: Right Transmetatarsal Amputation With Prevena VAC Placement;  Surgeon: [name], MD;  Location: [facility];  Service: Orthopedics;  Laterality: Right;   AMPUTATION Right 03/05/2016   Procedure: RIGHT BELOW KNEE AMPUTATION;  Surgeon: [name], MD;  Location: [facility];  Service: Orthopedics;  Laterality: Right;   CATARACT EXTRACTION W/PHACO Right 10/02/2018   Procedure: CATARACT EXTRACTION PHACO AND INTRAOCULAR LENS PLACEMENT ([facility]);  Surgeon: Baruch Goldmann, MD;  [facility];  Service: Ophthalmology;  Laterality: Right;  CDE: 130.53   COLONOSCOPY     COLONOSCOPY N/A 12/19/2019   Procedure: COLONOSCOPY;  Surgeon: Daneil Dolin, MD;  Diverticulosis in the sigmoid and descending colon, redundant colon, otherwise normal exam.    EYE SURGERY Left    Cataract   EYE SURGERY Right    r   ORBITAL RECONSTRUCTION Right    STUMP REVISION Right 11/10/2016   Procedure: REVISION RIGHT BELOW KNEE AMPUTATION;  Surgeon: [name], MD;  Location: [facility];  Service: Orthopedics;  Laterality: Right;   toe removal Right 2012,2014    Current Outpatient Medications  Medication Sig Dispense Refill   atorvastatin (LIPITOR) 40 MG tablet Take 1 tablet (40 mg total) by mouth daily. 90 tablet 1   cholecalciferol (VITAMIN D3) 25 MCG (1000 UNIT) tablet Take 1,000 Units by mouth daily.     insulin degludec (TRESIBA FLEXTOUCH) 200 UNIT/ML FlexTouch Pen Inject 60 Units into the skin at bedtime. (Patient taking differently: Inject 46 Units into the  skin in the morning and at bedtime. ) 9 mL 2   Insulin Syringe-Needle U-100 (RELION INSULIN SYR 0.3CC/30G) 30G X 5/16" 0.3 ML MISC 1 Syringe by Does not apply route 3 (three) times daily. Dx E11.42 100 each 11   Lecithin 1200 MG CAPS Take 1,200 mg by mouth daily. Sunflower Lecithin     lisinopril (ZESTRIL) 5 MG tablet Take 1 tablet (5 mg total) by mouth daily. 90 tablet 1   Multiple Vitamin (MULTIVITAMIN WITH MINERALS) TABS tablet Take 1 tablet by mouth daily. Equate Multivitamin for Men 50+     NOVOLOG FLEXPEN 100 UNIT/ML FlexPen Inject 10 Units into the skin 3 (three) times daily before meals.      buPROPion (WELLBUTRIN XL) 150 MG 24 hr tablet Take 1 tablet (150 mg total) by mouth daily. 90 tablet 3   No current facility-administered medications for this visit.    Allergies as of 06/16/2020 - Review Complete 06/16/2020  Allergen Reaction Noted   Bactrim [sulfamethoxazole-trimethoprim] Itching 12/12/2015   Gentamicin Other (See Comments) 08/11/2015    Family History  Problem Relation Age of Onset   Cirrhosis Mother        secondary to alcohol abuse.    Heart disease Father    Diabetes Sister    Diabetes Brother    Heart attack Paternal Grandfather    Colon cancer Maternal Grandfather        passed in the mid-60's.    Social History   Socioeconomic History   Marital status: Married    Spouse name: Not on file   Number of children: 0   Years of education: 14   Highest education level: Not on file  Occupational History   Occupation: Scientist, research (physical sciences)    Comment: [employer]  Tobacco Use   Smoking status: Never Smoker   Smokeless tobacco: Never Used  Scientific laboratory technician Use: Never used  Substance and Sexual Activity   Alcohol use: No   Drug use: No   Sexual activity: Not Currently    Birth control/protection: None  Other Topics Concern   Not on file  Social History Narrative   Not on file   Social Determinants of Health   Financial Resource  Strain:    Difficulty of Paying Living Expenses: Not on file  Food Insecurity:    Worried About Charity fundraiser in the Last Year: Not on file   [employer] of Food in the Last Year: Not on file  Transportation Needs:    Lack of Transportation (Medical): Not on file   Lack of Transportation (Non-Medical): Not on file  Physical Activity:    Days of Exercise per Week: Not on file   Minutes of Exercise per Session: Not on file  Stress:    Feeling of Stress : Not on file  Social Connections:    Frequency of Communication with Friends and Family: Not on file   Frequency of Social Gatherings with Friends and Family: Not on file   Attends Religious Services:  Not on file   Active Member of Clubs or Organizations: Not on file   Attends Club or Organization Meetings: Not on file   Marital Status: Not on file    Review of Systems: Gen: Denies fever, chills, cold or flulike symptoms, lightheadedness, dizziness, presyncope, syncope. CV: Denies chest pain or palpitations Resp: Denies dyspnea or cough. GI: See HPI Heme: See HPI  Physical Exam: BP (!) 148/71    Pulse 93    Temp (!) 97.1 F (36.2 C)    Ht _0  (1.88 m)    Wt 281 lb (127.5 kg)    BMI 36.08 kg/m  General:   Alert and oriented. No distress noted. Pleasant and cooperative.  Head:  Normocephalic and atraumatic. Eyes:  Conjuctiva clear without scleral icterus. Heart:  S1, S2 present without murmurs appreciated. Lungs:  Clear to auscultation bilaterally. No wheezes, rales, or rhonchi. No distress.  Abdomen:  +BS, soft, non-tender and non-distended. No rebound or guarding. No HSM or masses noted. Msk:  Symmetrical without gross deformities. Normal posture. Extremities:  Without edema. Neurologic:  Alert and  oriented x4 Psych:  Normal mood and affect.

## 2020-06-16 ENCOUNTER — Ambulatory Visit: Payer: Medicare HMO | Admitting: Gastroenterology

## 2020-06-16 ENCOUNTER — Other Ambulatory Visit: Payer: Self-pay

## 2020-06-16 ENCOUNTER — Encounter: Payer: Self-pay | Admitting: Gastroenterology

## 2020-06-16 VITALS — BP 148/71 | HR 93 | Temp 97.1°F | Ht 74.0 in | Wt 281.0 lb

## 2020-06-16 DIAGNOSIS — R7401 Elevation of levels of liver transaminase levels: Secondary | ICD-10-CM | POA: Insufficient documentation

## 2020-06-16 NOTE — Patient Instructions (Addendum)
Please have labs completed.  We will fax labs over to [facility].  Instructions for fatty liver: Recommend 1-2# weight loss per week until ideal body weight through exercise & diet. Low fat/cholesterol diet.   Avoid sweets, sodas, fruit juices, sweetened beverages like tea, etc. Gradually increase exercise from 15 min daily up to 1 hr per day 5 days/week. Limit alcohol use. No more than 2000 mg of Tylenol per day if needed. Avoid over-the-counter supplements and herbal teas.  We will follow up with you in 6 months.  It was good to meet you today!  I hope you have a great Thanksgiving and Christmas!  Aliene Altes, PA-C [facility]   Nonalcoholic Fatty Liver Disease Diet, Adult Nonalcoholic fatty liver disease is a condition that causes fat to build up in and around the liver. The disease makes it harder for the liver to work the way that it should. Following a healthy diet can help to keep nonalcoholic fatty liver disease under control. It can also help to prevent or improve conditions that are associated with the disease, such as heart disease, diabetes, high blood pressure, and abnormal cholesterol levels. Along with regular exercise, this diet:  Promotes weight loss.  Helps to control blood sugar levels.  Helps to improve the way that the body uses insulin. What are tips for following this plan? Reading food labels Always check food labels for:  The amount of saturated fat in a food. You should limit your intake of saturated fat. Saturated fat is found in foods that come from animals, including meat and dairy products such as butter, cheese, and whole milk.  The amount of fiber in a food. You should choose high-fiber foods such as fruits, vegetables, and whole grains. Try to get 25-30 grams (g) of fiber a day.  Cooking  When cooking, use heart-healthy oils that are high in monounsaturated fats. These include olive oil, canola oil, and avocado  oil.  Limit frying or deep-frying foods. Cook foods using healthy methods such as baking, boiling, steaming, and grilling instead. Meal planning  You may want to keep track of how many calories you take in. Eating the right amount of calories will help you achieve a healthy weight. Meeting with a registered dietitian can help you get started.  Limit how often you eat takeout and fast food. These foods are usually very high in fat, salt, and sugar.  Use the glycemic index (GI) to plan your meals. The index tells you how quickly a food will raise your blood sugar. Choose low-GI foods (GI less than 55). These foods take a longer time to raise blood sugar. A registered dietitian can help you identify foods lower on the GI scale. Lifestyle  You may want to follow a Mediterranean diet. This diet includes a lot of vegetables, lean meats or fish, whole grains, fruits, and healthy oils and fats. What foods can I eat?  Fruits Bananas. Apples. Oranges. Grapes. Papaya. Mango. Pomegranate. Kiwi. Grapefruit. Cherries. Vegetables Lettuce. Spinach. Peas. Beets. Cauliflower. Cabbage. Broccoli. Carrots. Tomatoes. Squash. Eggplant. Herbs. Peppers. Onions. Cucumbers. Brussels sprouts. Yams and sweet potatoes. Beans. Lentils. Grains Whole wheat or whole-grain foods, including breads, crackers, cereals, and pasta. Stone-ground whole wheat. Unsweetened oatmeal. Bulgur. Barley. Quinoa. Brown or wild rice. Corn or whole wheat flour tortillas. Meats and other proteins Lean meats. Poultry. Tofu. Seafood and shellfish. Dairy Low-fat or fat-free dairy products, such as yogurt, cottage cheese, or cheese. Beverages Water. Sugar-free drinks. Tea. Coffee. Low-fat or skim milk.  Milk alternatives, such as soy or almond milk. Real fruit juice. Fats and oils Avocado. Canola or olive oil. Nuts and nut butters. Seeds. Seasonings and condiments Mustard. Relish. Low-fat, low-sugar ketchup and barbecue sauce. Low-fat or  fat-free mayonnaise. Sweets and desserts Sugar-free sweets. The items listed above may not be a complete list of foods and beverages you can eat. Contact a dietitian for more information. What foods should I limit or avoid? Meats and other proteins Limit red meat to 1-2 times a week. Dairy [employer]. Fats and oils Palm oil and coconut oil. Fried foods. Other foods Processed foods. Foods that contain a lot of salt or sodium. Sweets and desserts Sweets that contain sugar. Beverages Sweetened drinks, such as sweet tea, milkshakes, iced sweet drinks, and sodas. Alcohol. The items listed above may not be a complete list of foods and beverages you should avoid. Contact a dietitian for more information. Where to find more information The [employer] of Diabetes and Digestive and Kidney Diseases: [employer].is Summary  Nonalcoholic fatty liver disease is a condition that causes fat to build up in and around the liver.  Following a healthy diet can help to keep nonalcoholic fatty liver disease under control. Your diet should be rich in fruits, vegetables, whole grains, and lean proteins.  Limit your intake of saturated fat. Saturated fat is found in foods that come from animals, including meat and dairy products such as butter, cheese, and whole milk.  This diet promotes weight loss, helps to control blood sugar levels, and helps to improve the way that the body uses insulin. This information is not intended to replace advice given to you by your health care provider. Make sure you discuss any questions you have with your health care provider. Document Revised: 11/03/2018 Document Reviewed: 08/03/2018 Elsevier Patient Education  [place].

## 2020-06-17 ENCOUNTER — Ambulatory Visit: Payer: Medicare HMO | Admitting: Family Medicine

## 2020-06-18 ENCOUNTER — Encounter: Payer: Self-pay | Admitting: Family Medicine

## 2020-06-18 ENCOUNTER — Other Ambulatory Visit: Payer: Self-pay

## 2020-06-18 ENCOUNTER — Ambulatory Visit (INDEPENDENT_AMBULATORY_CARE_PROVIDER_SITE_OTHER): Payer: Medicare HMO | Admitting: Family Medicine

## 2020-06-18 ENCOUNTER — Encounter: Payer: Self-pay | Admitting: Gastroenterology

## 2020-06-18 VITALS — BP 135/63 | HR 79 | Temp 98.2°F | Ht 74.0 in | Wt 279.2 lb

## 2020-06-18 DIAGNOSIS — F329 Major depressive disorder, single episode, unspecified: Secondary | ICD-10-CM

## 2020-06-18 DIAGNOSIS — R7401 Elevation of levels of liver transaminase levels: Secondary | ICD-10-CM | POA: Diagnosis not present

## 2020-06-18 DIAGNOSIS — R3912 Poor urinary stream: Secondary | ICD-10-CM | POA: Diagnosis not present

## 2020-06-18 DIAGNOSIS — R69 Illness, unspecified: Secondary | ICD-10-CM | POA: Diagnosis not present

## 2020-06-18 DIAGNOSIS — F32A Depression, unspecified: Secondary | ICD-10-CM

## 2020-06-18 MED ORDER — BUPROPION HCL ER (XL) 150 MG PO TB24: 150.0000 mg | ORAL_TABLET | Freq: Every day | ORAL | 3 refills | Status: DC

## 2020-06-18 NOTE — Assessment & Plan Note (Addendum)
69 year old male with isolated ALT elevation dating back at least to 2017.  Brief normalization, but ALT became elevated again in 2019 and has persisted, <2x ULN.  Significant serologic work-up in March 2021 with hepatitis A, B, C negative, no immunity to hepatitis A or B,  ferritin WNL, ceruloplasmin WNL, AMA negative, ASMA negative, ANA positive with 1:40 titer, cytoplasmic pattern.  RUQ ultrasound April 2021 with hepatomegaly and increased echogenicity of liver parenchyma likely reflecting steatosis.  No signs or symptoms of decompensated liver disease today.  No OTC supplements, Tylenol, alcohol, or drug use.  Recently resume Lipitor 3 months ago.  Had been off the Lipitor for about 6 months prior to this.  Interestingly, ALT did come down to 47 in March when he was off of Lipitor.  Most recent labs 05/08/2020 with ALT 57, otherwise LFTs within normal limits.  Suspect mild ALT elevation may be secondary to fatty liver.  Could also have drug effect secondary to Lipitor.  Cannot rule out mild autoimmune hepatitis with positive ANA, but this is less likely.  Plan: Check immunoglobulins.  If negative, may need to consider trying a different cholesterol medication for short time.  Would need to weigh risk and benefits and discuss with PCP.  If positive, may need to consider liver biopsy. Counseled on fatty liver:  Recommend 1-2# weight loss per week until ideal body weight through exercise & diet.  Low fat/cholesterol diet.    Avoid sweets, sodas, fruit juices, sweetened beverages like tea, etc.  Gradually increase exercise from 15 min daily up to 1 hr per day 5 days/week.  Limit alcohol use. Nonalcoholic fatty liver handout also provided. No more than 2000 mg of Tylenol per day if needed. Avoid over-the-counter supplements and herbal teas. Follow-up in 6 months.
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
## 2020-06-19 LAB — IGG, IGA, IGM
IgA/Immunoglobulin A, Serum: 74 mg/dL (ref 61–437)
IgG (Immunoglobin G), Serum: 554 mg/dL — ABNORMAL LOW (ref 603–1613)
IgM (Immunoglobulin M), Srm: 20 mg/dL (ref 20–172)

## 2020-06-19 LAB — PSA: Prostate Specific Ag, Serum: 0.4 ng/mL (ref 0.0–4.0)

## 2020-06-23 ENCOUNTER — Other Ambulatory Visit: Payer: Self-pay | Admitting: Family Medicine

## 2020-06-23 ENCOUNTER — Ambulatory Visit: Payer: Medicare HMO | Admitting: Pharmacist

## 2020-06-23 ENCOUNTER — Telehealth: Payer: Self-pay | Admitting: Family Medicine

## 2020-06-23 MED ORDER — ROSUVASTATIN CALCIUM 20 MG PO TABS: 20.0000 mg | ORAL_TABLET | Freq: Every day | ORAL | 3 refills | Status: DC

## 2020-06-23 NOTE — Telephone Encounter (Signed)
Pt aware rosuvastatin was sent in today to take the place of lipitor.

## 2020-06-23 NOTE — Progress Notes (Signed)
lmtcb

## 2020-06-23 NOTE — Progress Notes (Signed)
Cc'ed to pcp °

## 2020-06-23 NOTE — Telephone Encounter (Signed)
Pt is returning call about medication change

## 2020-06-25 ENCOUNTER — Other Ambulatory Visit: Payer: Medicare HMO

## 2020-06-25 ENCOUNTER — Ambulatory Visit: Payer: Medicare HMO | Admitting: Urology

## 2020-06-25 ENCOUNTER — Other Ambulatory Visit: Payer: Self-pay | Admitting: Internal Medicine

## 2020-06-25 DIAGNOSIS — Z20822 Contact with and (suspected) exposure to covid-19: Secondary | ICD-10-CM | POA: Diagnosis not present

## 2020-06-25 NOTE — Progress Notes (Signed)
Patient aware per telephone encounter.

## 2020-06-26 ENCOUNTER — Ambulatory Visit: Payer: Medicare HMO | Admitting: Pharmacist

## 2020-06-26 LAB — SARS-COV-2, NAA 2 DAY TAT

## 2020-06-26 LAB — SPECIMEN STATUS REPORT

## 2020-06-26 LAB — NOVEL CORONAVIRUS, NAA: SARS-CoV-2, NAA: NOT DETECTED

## 2020-07-03 ENCOUNTER — Other Ambulatory Visit: Payer: Self-pay

## 2020-07-03 ENCOUNTER — Ambulatory Visit (INDEPENDENT_AMBULATORY_CARE_PROVIDER_SITE_OTHER): Payer: Medicare HMO | Admitting: Pharmacist

## 2020-07-03 VITALS — BP 135/63 | HR 73

## 2020-07-03 DIAGNOSIS — E119 Type 2 diabetes mellitus without complications: Secondary | ICD-10-CM

## 2020-07-03 NOTE — Progress Notes (Signed)
    07/08/2020 Name: Rick Lane MRN: [id number] DOB: 12/04/1950   S:  69 yoM Presents for diabetes evaluation, education, and management.  He also is in need of patient assistance for his diabetes medications Patient was referred and last seen by Primary Care Provider on 06/18/20.  Insurance coverage/medication affordability: Aetna Medicare  Patient reports adherence with medications. . Current diabetes medications include: tresiba/novolog . Current hypertension medications include: lisinopril Goal 130/80 . Current hyperlipidemia medications include: rosuvastatin   Patient-reported exercise habits: n/a due to physical condition  The patient is asked to make an attempt to improve diet and exercise patterns to aid in medical management of this problem.  Discussed meal planning options and Plate method for healthy eating . Avoid sugary drinks and desserts . Incorporate balanced protein, non starchy veggies, 1 serving of carbohydrate with each meal . Increase water intake . Increase physical activity as able   O:  Lab Results  Component Value Date   HGBA1C 7.3 (H) 05/08/2020   Lipid Panel     Component Value Date/Time   CHOL 222 (H) 05/08/2020 1028   TRIG 207 (H) 05/08/2020 1028   HDL 40 05/08/2020 1028   CHOLHDL 5.6 (H) 05/08/2020 1028   LDLCALC 144 (H) 05/08/2020 1028   LDLDIRECT 84 06/27/2019 1339    Home fasting blood sugars: <145  2 hour post-meal/random blood sugars: n/a.   A/P:  Diabetes T2DM, A1c currently 7.3%.  Patient is adherent with medication. Control is suboptimal due to diet/lifestyle.  -Patient assistance forms for [employer] 2022 filled out for [place] and Novolog  Medication will ship to PCP office in mid-January 2022 (20-month supply)  -Continue current regimen  -Consider GLP1 addition at next visit  -Extensively discussed pathophysiology of diabetes, recommended lifestyle interventions, dietary effects on blood sugar  control  -Counseled on s/sx of and management of hypoglycemia  -Next A1C anticipated 3-6 months.     Written patient instructions provided.  Total time in face to face counseling 25 minutes.   Follow up PCP Clinic Visit ON 08/18/20.   Regina Eck, PharmD, BCPS Clinical Pharmacist, [place]  II Phone [phone]

## 2020-07-07 ENCOUNTER — Ambulatory Visit: Payer: Medicare HMO | Admitting: Nutrition

## 2020-07-08 ENCOUNTER — Encounter: Payer: Self-pay | Admitting: Pharmacist

## 2020-07-24 DIAGNOSIS — Z89511 Acquired absence of right leg below knee: Secondary | ICD-10-CM | POA: Diagnosis not present

## 2020-08-06 ENCOUNTER — Other Ambulatory Visit: Payer: Self-pay

## 2020-08-06 ENCOUNTER — Ambulatory Visit (INDEPENDENT_AMBULATORY_CARE_PROVIDER_SITE_OTHER): Payer: Medicare HMO | Admitting: Urology

## 2020-08-06 ENCOUNTER — Encounter: Payer: Self-pay | Admitting: Urology

## 2020-08-06 VITALS — BP 147/70 | HR 91 | Temp 98.0°F | Ht 74.0 in | Wt 280.0 lb

## 2020-08-06 DIAGNOSIS — R351 Nocturia: Secondary | ICD-10-CM | POA: Insufficient documentation

## 2020-08-06 DIAGNOSIS — R3912 Poor urinary stream: Secondary | ICD-10-CM | POA: Diagnosis not present

## 2020-08-06 DIAGNOSIS — N401 Enlarged prostate with lower urinary tract symptoms: Secondary | ICD-10-CM | POA: Diagnosis not present

## 2020-08-06 DIAGNOSIS — N138 Other obstructive and reflux uropathy: Secondary | ICD-10-CM | POA: Diagnosis not present

## 2020-08-06 LAB — URINALYSIS, ROUTINE W REFLEX MICROSCOPIC
Bilirubin, UA: NEGATIVE
Glucose, UA: NEGATIVE
Leukocytes,UA: NEGATIVE
Nitrite, UA: NEGATIVE
Protein,UA: NEGATIVE
RBC, UA: NEGATIVE
Specific Gravity, UA: 1.02 (ref 1.005–1.030)
Urobilinogen, Ur: 0.2 mg/dL (ref 0.2–1.0)
pH, UA: 5.5 (ref 5.0–7.5)

## 2020-08-06 NOTE — Progress Notes (Signed)
Urological Symptom Review ° °Patient is experiencing the following symptoms: °Get up at night to urinate ° ° °Review of Systems ° °Gastrointestinal (upper)  : °Negative for upper GI symptoms ° °Gastrointestinal (lower) : °Negative for lower GI symptoms ° °Constitutional : °Negative for symptoms ° °Skin: °Negative for skin symptoms ° °Eyes: °Negative for eye symptoms ° °Ear/Nose/Throat : °Negative for Ear/Nose/Throat symptoms ° °Hematologic/Lymphatic: °Negative for Hematologic/Lymphatic symptoms ° °Cardiovascular : °Negative for cardiovascular symptoms ° °Respiratory : °Negative for respiratory symptoms ° °Endocrine: °Negative for endocrine symptoms ° °Musculoskeletal: °Negative for musculoskeletal symptoms ° °Neurological: °Negative for neurological symptoms ° °Psychologic: °Depression °

## 2020-08-06 NOTE — Patient Instructions (Signed)

## 2020-08-06 NOTE — Progress Notes (Signed)
08/06/2020 12:00 PM   [name] May 13, 1951 [id number]  Referring provider: Janora Norlander, DO [place],  [place] [address]  Weak urinary stream  HPI: Mr Hartwig is a 70yo here for evaluation of a weak urinary stream. For the past 6 months his wife is concerned his stream is weaker. Nocturia 1x.  PSA 0.4. He denies any hesitancy, starting/stopping, straining to urinate, urinary frequency, urinary urgency. He is not bothered by his urination.    PMH: Past Medical History:  Diagnosis Date  . Asthma    ass a child  . Cataract   . 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 Depression   . Diabetes mellitus without complication ([place])    Type II  . Hyperlipidemia   . Neuropathy   . Neuropathy   . Sleep apnea     Surgical History: Past Surgical History:  Procedure Laterality Date  . AMPUTATION Right 12/12/2015   Procedure: Right Transmetatarsal Amputation With [facility] Placement;  Surgeon: [name], MD;  Location: [facility];  Service: Orthopedics;  Laterality: Right;  . AMPUTATION Right 03/05/2016   Procedure: RIGHT BELOW KNEE AMPUTATION;  Surgeon: [name], MD;  Location: [facility];  Service: Orthopedics;  Laterality: Right;  . CATARACT EXTRACTION W/PHACO Right 10/02/2018   Procedure: CATARACT EXTRACTION PHACO AND INTRAOCULAR LENS PLACEMENT ([place]);  Surgeon: Baruch Goldmann, MD;  Location: [facility];  Service: Ophthalmology;  Laterality: Right;  CDE: 130.53  . COLONOSCOPY    . COLONOSCOPY N/A 12/19/2019   Procedure: COLONOSCOPY;  Surgeon: Daneil Dolin, MD;  Diverticulosis in the sigmoid and descending colon, redundant colon, otherwise normal exam.   . EYE SURGERY Left    Cataract  . EYE SURGERY Right    r  . ORBITAL RECONSTRUCTION Right   . STUMP REVISION Right 11/10/2016   Procedure: REVISION RIGHT BELOW KNEE AMPUTATION;  Surgeon: [name], MD;  Location: [facility];  Service: Orthopedics;  Laterality: Right;  . toe removal  Right 2012,2014    Home Medications:  Allergies as of 08/06/2020      Reactions   Bactrim [sulfamethoxazole-trimethoprim] Itching   Gentamicin Other (See Comments)   Unknown      Medication List       Accurate as of August 06, 2020 12:00 PM. If you have any questions, ask your nurse or doctor.        buPROPion 150 MG 24 hr tablet Commonly known as: Wellbutrin XL Take 1 tablet (150 mg total) by mouth daily.   cholecalciferol 25 MCG (1000 UNIT) tablet Commonly known as: VITAMIN D3 Take 1,000 Units by mouth daily.   Insulin Syringe-Needle U-100 30G X 5/16" 0.3 ML Misc Commonly known as: RELION INSULIN SYR 0.3CC/30G 1 Syringe by Does not apply route 3 (three) times daily. Dx E11.42   Lecithin 1200 MG Caps Take 1,200 mg by mouth daily. Sunflower Lecithin   lisinopril 5 MG tablet Commonly known as: ZESTRIL Take 1 tablet (5 mg total) by mouth daily.   multivitamin with minerals Tabs tablet Take 1 tablet by mouth daily. Equate Multivitamin for Men 50+   NovoLOG FlexPen 100 UNIT/ML FlexPen Generic drug: insulin aspart Inject 10 Units into the skin 3 (three) times daily before meals.   rosuvastatin 20 MG tablet Commonly known as: Crestor Take 1 tablet (20 mg total) by mouth daily.   tiZANidine 4 MG tablet Commonly known as: ZANAFLEX Take 4 mg by mouth at bedtime.  Tyler Aas FlexTouch 200 UNIT/ML FlexTouch Pen Generic drug: insulin degludec Inject 60 Units into the skin at bedtime. What changed:   how much to take  when to take this       Allergies:  Allergies  Allergen Reactions  . Bactrim [Sulfamethoxazole-Trimethoprim] Itching  . Gentamicin Other (See Comments)    Unknown    Family History: Family History  Problem Relation Age of Onset  . Cirrhosis Mother        secondary to alcohol abuse.   Marland Kitchen Heart disease Father   . Diabetes Sister   . Diabetes Brother   . Heart attack Paternal Grandfather   . Colon cancer Maternal Grandfather        passed  in the mid-60's.    Social History:  reports that he has never smoked. He has never used smokeless tobacco. He reports that he does not drink alcohol and does not use drugs.  ROS: All other review of systems were reviewed and are negative except what is noted above in HPI  Physical Exam: BP (!) 147/70   Pulse 91   Temp 98 F (36.7 C)   Ht 6\' 2"  (1.88 m)   Wt 280 lb (127 kg)   BMI 35.95 kg/m   Constitutional:  Alert and oriented, No acute distress. HEENT: [place] AT, moist mucus membranes.  Trachea midline, no masses. Cardiovascular: No clubbing, cyanosis, or edema. Respiratory: Normal respiratory effort, no increased work of breathing. GI: Abdomen is soft, nontender, nondistended, no abdominal masses GU: No CVA tenderness. Prostate 30g smooth, no induration, no nodules Lymph: No cervical or inguinal lymphadenopathy. Skin: No rashes, bruises or suspicious lesions. Neurologic: Grossly intact, no focal deficits, moving all 4 extremities. Psychiatric: Normal mood and affect.  Laboratory Data: Lab Results  Component Value Date   WBC 5.9 10/24/2019   HGB 15.4 10/24/2019   HCT 46.0 10/24/2019   MCV 92.0 10/24/2019   PLT 193 10/24/2019    Lab Results  Component Value Date   CREATININE 1.19 05/08/2020    No results found for: PSA  No results found for: TESTOSTERONE  Lab Results  Component Value Date   HGBA1C 7.3 (H) 05/08/2020    Urinalysis No results found for: COLORURINE, APPEARANCEUR, LABSPEC, PHURINE, GLUCOSEU, HGBUR, BILIRUBINUR, KETONESUR, PROTEINUR, UROBILINOGEN, NITRITE, LEUKOCYTESUR  Lab Results  Component Value Date   LABMICR 21.2 03/23/2019    Pertinent Imaging:  No results found for this or any previous visit.  No results found for this or any previous visit.  No results found for this or any previous visit.  No results found for this or any previous visit.  No results found for this or any previous visit.  No results found for this or any  previous visit.  No results found for this or any previous visit.  No results found for this or any previous visit.   Assessment & Plan:    1. Nocturia -Fluid management 2 hours prior to going to bed - Urinalysis, Routine w reflex microscopic  2. BPH with Weak stream -We discussed the management of his BPH including observation, medical therapy, Rezum, Urolift, TURP and simple prostatectomy. After discussing the options the patient has elected to proceed with observation since he is not bothered by his LUTS. RTC prn    No follow-ups on file.  Nicolette Bang, MD  [facility] [place]

## 2020-08-18 ENCOUNTER — Other Ambulatory Visit: Payer: Self-pay

## 2020-08-18 ENCOUNTER — Encounter: Payer: Self-pay | Admitting: Family Medicine

## 2020-08-18 ENCOUNTER — Ambulatory Visit (INDEPENDENT_AMBULATORY_CARE_PROVIDER_SITE_OTHER): Payer: Medicare HMO | Admitting: Family Medicine

## 2020-08-18 VITALS — BP 135/67 | HR 59 | Temp 97.6°F | Ht 74.0 in | Wt 273.0 lb

## 2020-08-18 DIAGNOSIS — E1169 Type 2 diabetes mellitus with other specified complication: Secondary | ICD-10-CM | POA: Diagnosis not present

## 2020-08-18 DIAGNOSIS — Z89511 Acquired absence of right leg below knee: Secondary | ICD-10-CM

## 2020-08-18 DIAGNOSIS — E785 Hyperlipidemia, unspecified: Secondary | ICD-10-CM | POA: Diagnosis not present

## 2020-08-18 DIAGNOSIS — R809 Proteinuria, unspecified: Secondary | ICD-10-CM | POA: Diagnosis not present

## 2020-08-18 DIAGNOSIS — E1129 Type 2 diabetes mellitus with other diabetic kidney complication: Secondary | ICD-10-CM | POA: Diagnosis not present

## 2020-08-18 DIAGNOSIS — E119 Type 2 diabetes mellitus without complications: Secondary | ICD-10-CM

## 2020-08-18 LAB — BAYER DCA HB A1C WAIVED: HB A1C (BAYER DCA - WAIVED): 6.4 % (ref <7.0)

## 2020-08-18 LAB — CMP14+EGFR
ALT: 36 IU/L (ref 0–44)
AST: 22 IU/L (ref 0–40)
Albumin/Globulin Ratio: 2 (ref 1.2–2.2)
Albumin: 4.4 g/dL (ref 3.8–4.8)
Alkaline Phosphatase: 47 IU/L (ref 44–121)
BUN/Creatinine Ratio: 17 (ref 10–24)
BUN: 20 mg/dL (ref 8–27)
Bilirubin Total: 0.4 mg/dL (ref 0.0–1.2)
CO2: 23 mmol/L (ref 20–29)
Calcium: 9.8 mg/dL (ref 8.6–10.2)
Chloride: 105 mmol/L (ref 96–106)
Creatinine, Ser: 1.19 mg/dL (ref 0.76–1.27)
GFR calc Af Amer: 72 mL/min/{1.73_m2} (ref >59)
GFR calc non Af Amer: 62 mL/min/{1.73_m2} (ref >59)
Globulin, Total: 2.2 g/dL (ref 1.5–4.5)
Glucose: 178 mg/dL — ABNORMAL HIGH (ref 65–99)
Potassium: 4.8 mmol/L (ref 3.5–5.2)
Sodium: 142 mmol/L (ref 134–144)
Total Protein: 6.6 g/dL (ref 6.0–8.5)

## 2020-08-18 LAB — LIPID PANEL
Chol/HDL Ratio: 3.6 ratio (ref 0.0–5.0)
Cholesterol, Total: 134 mg/dL (ref 100–199)
HDL: 37 mg/dL — ABNORMAL LOW (ref >39)
LDL Chol Calc (NIH): 73 mg/dL (ref 0–99)
Triglycerides: 137 mg/dL (ref 0–149)
VLDL Cholesterol Cal: 24 mg/dL (ref 5–40)

## 2020-08-18 NOTE — Progress Notes (Signed)
Subjective: CC: DM PCP: Rick Norlander, DO JAS:[id number] Rick Lane is a 70 y.o. male presenting to clinic today for:  1. Type 2 Diabetes with hypertension, hyperlipidemia:  Patient reports that he is been feeling well.  He injects on average 10 units of NovoLog each time and 60 units of [place] each day.  No reports of hypo or hyperglycemic episodes  Last eye exam: Up-to-date Last foot exam: Up-to-date Last A1c:  Lab Results  Component Value Date   HGBA1C 7.3 (H) 05/08/2020   Nephropathy screen indicated?:  On ACE inhibitor Last flu, zoster and/or pneumovax:  There is no immunization history on file for this patient.  ROS: He denies any chest pain, shortness of breath, change in exercise tolerance or vision.  He does report some intermittent tingling in the left foot.  This is relieved by use of B12 complex.  2.  Mood Patient reports good mood with Wellbutrin XL 150 daily.  His relationship with his wife has been better.  They in fact even recently said that they loved each other.   ROS: Per HPI  Allergies  Allergen Reactions  . Bactrim [Sulfamethoxazole-Trimethoprim] Itching  . Gentamicin Other (See Comments)    Unknown   Past Medical History:  Diagnosis Date  . Asthma    ass a child  . Cataract   . 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 Depression   . Diabetes mellitus without complication ([place])    Type II  . Hyperlipidemia   . Neuropathy   . Neuropathy   . Sleep apnea     Current Outpatient Medications:  .  buPROPion (WELLBUTRIN XL) 150 MG 24 hr tablet, Take 1 tablet (150 mg total) by mouth daily., Disp: 90 tablet, Rfl: 3 .  cholecalciferol (VITAMIN D3) 25 MCG (1000 UNIT) tablet, Take 1,000 Units by mouth daily., Disp: , Rfl:  .  insulin degludec (TRESIBA FLEXTOUCH) 200 UNIT/ML FlexTouch Pen, Inject 60 Units into the skin at bedtime. (Patient taking differently: Inject 46 Units into the skin in the morning and at bedtime.),  Disp: 9 mL, Rfl: 2 .  Insulin Syringe-Needle U-100 (RELION INSULIN SYR 0.3CC/30G) 30G X 5/16" 0.3 ML MISC, 1 Syringe by Does not apply route 3 (three) times daily. Dx E11.42, Disp: 100 each, Rfl: 11 .  Lecithin 1200 MG CAPS, Take 1,200 mg by mouth daily. Sunflower Lecithin, Disp: , Rfl:  .  lisinopril (ZESTRIL) 5 MG tablet, Take 1 tablet (5 mg total) by mouth daily., Disp: 90 tablet, Rfl: 1 .  Multiple Vitamin (MULTIVITAMIN WITH MINERALS) TABS tablet, Take 1 tablet by mouth daily. Equate Multivitamin for Men 50+, Disp: , Rfl:  .  NOVOLOG FLEXPEN 100 UNIT/ML FlexPen, Inject 10 Units into the skin 3 (three) times daily before meals. , Disp: , Rfl:  .  rosuvastatin (CRESTOR) 20 MG tablet, Take 1 tablet (20 mg total) by mouth daily., Disp: 90 tablet, Rfl: 3 .  tiZANidine (ZANAFLEX) 4 MG tablet, Take 4 mg by mouth at bedtime., Disp: , Rfl:  Social History   Socioeconomic History  . Marital status: Married    Spouse name: Not on file  . Number of children: 0  . Years of education: 79  . Highest education level: Not on file  Occupational History  . Occupation: Scientist, research (physical sciences)    Comment: [employer]  Tobacco Use  . Smoking status: Never Smoker  . Smokeless tobacco: Never Used  Vaping Use  . Vaping Use: Never used  Substance  and Sexual Activity  . Alcohol use: No  . Drug use: No  . Sexual activity: Not Currently    Birth control/protection: None  Other Topics Concern  . Not on file  Social History Narrative  . Not on file   Social Determinants of Health   Financial Resource Strain: Not on file  Food Insecurity: Not on file  Transportation Needs: Not on file  Physical Activity: Not on file  Stress: Not on file  Social Connections: Not on file  Intimate Partner Violence: Not on file   Family History  Problem Relation Age of Onset  . Cirrhosis Mother        secondary to alcohol abuse.   Marland Kitchen Heart disease Father   . Diabetes Sister   . Diabetes Brother   . Heart attack Paternal  Grandfather   . Colon cancer Maternal Grandfather        passed in the mid-60's.    Objective: Office vital signs reviewed. BP 135/67   Pulse (!) 59   Temp 97.6 F (36.4 C) (Temporal)   Ht 6' 2"  (1.88 m)   Wt 273 lb (123.8 kg)   SpO2 96%   BMI 35.05 kg/m   Physical Examination:  General: Awake, alert, well nourished, No acute distress HEENT: Normal; sclera white.  Moist mucous membranes Cardio: regular rate and rhythm, S1S2 heard, no murmurs appreciated Pulm: clear to auscultation bilaterally, no wheezes, rhonchi or rales; normal work of breathing on room air MSK: Ambulating independently.  Right lower extremity surgically absent.  Prosthesis in place Psych: Mood is stable, speech is normal, affect is appropriate.  Patient is pleasant and interactive  Assessment/ Plan: 70 y.o. male   Type 2 diabetes mellitus without complication, without long-term current use of insulin ([place]) - Plan: Bayer DCA Hb A1c Waived  Hyperlipidemia associated with type 2 diabetes mellitus ([place]) - Plan: Lipid Panel, CMP14+EGFR  Microalbuminuria due to type 2 diabetes mellitus ([place]) - Plan: CMP14+EGFR  S/P unilateral BKA (below knee amputation), right ([place])  Sugars well controlled with A1c of 6.4.  Continue current regimen Continue statin Continue ACE inhibitor Tingling in foot seems to be secondary to B12 deficiency as it is relieved by use of B12.  We discussed that if symptoms continue or he develops any other concerning symptoms or signs low threshold to further evaluate, particular in the setting of history of BKA  No orders of the defined types were placed in this encounter.  No orders of the defined types were placed in this encounter.    Rick Norlander, DO [place] [phone]

## 2020-08-25 ENCOUNTER — Other Ambulatory Visit: Payer: Self-pay

## 2020-08-25 MED ORDER — NOVOLOG FLEXPEN 100 UNIT/ML ~~LOC~~ SOPN: 10.0000 [IU] | PEN_INJECTOR | Freq: Three times a day (TID) | SUBCUTANEOUS | 99 refills | Status: DC

## 2020-08-25 NOTE — Telephone Encounter (Signed)
Spouse calling to check up on the application for rx assistance for diabetic medication. She does not know the name of medications. She thinks tresiba is on of them. Pt will be out today. Please call back

## 2020-08-25 NOTE — Telephone Encounter (Signed)
Pt is out of both [place] & Novolog Tresiba samples set aside in refrigerator Can novolog be sent to [facility] for patient to see how much it will cost him till pt assist comes in Historical med.

## 2020-09-01 ENCOUNTER — Ambulatory Visit: Payer: Medicare HMO | Admitting: Nutrition

## 2020-09-01 ENCOUNTER — Ambulatory Visit: Payer: Medicare HMO | Admitting: "Endocrinology

## 2020-09-22 ENCOUNTER — Telehealth: Payer: Self-pay | Admitting: Family Medicine

## 2020-09-22 NOTE — Telephone Encounter (Signed)
Samples ready pt aware to pick up

## 2020-09-22 NOTE — Telephone Encounter (Signed)
Pt needs two tresiba pen samples and would like to be called when they are ready to be picked up

## 2020-10-03 ENCOUNTER — Telehealth: Payer: Self-pay | Admitting: *Deleted

## 2020-10-03 NOTE — Telephone Encounter (Signed)
Patient assistance medications came in and patient aware he may come and pick up.   We received: [place]- we received 4 boxes Novolog-we received 2 boxes Pen Needles

## 2020-12-10 ENCOUNTER — Telehealth: Payer: Self-pay | Admitting: Pharmacist

## 2020-12-10 NOTE — Telephone Encounter (Signed)
REFILLS FAXED TO [facility] [place] PATIENT ASSISTANCE TRESIBA, NOVOLOG, PEN NEEDLES WILL TAKE 2-3 WEEKS TO SHIP TO PCP OFFICE

## 2020-12-11 ENCOUNTER — Encounter: Payer: Self-pay | Admitting: Family Medicine

## 2020-12-11 ENCOUNTER — Ambulatory Visit (INDEPENDENT_AMBULATORY_CARE_PROVIDER_SITE_OTHER): Payer: Medicare HMO | Admitting: Family Medicine

## 2020-12-11 VITALS — BP 133/72 | HR 76 | Temp 98.5°F

## 2020-12-11 DIAGNOSIS — R059 Cough, unspecified: Secondary | ICD-10-CM | POA: Diagnosis not present

## 2020-12-11 DIAGNOSIS — J988 Other specified respiratory disorders: Secondary | ICD-10-CM | POA: Diagnosis not present

## 2020-12-11 DIAGNOSIS — B9689 Other specified bacterial agents as the cause of diseases classified elsewhere: Secondary | ICD-10-CM

## 2020-12-11 MED ORDER — AMOXICILLIN-POT CLAVULANATE 875-125 MG PO TABS: 1.0000 | ORAL_TABLET | Freq: Two times a day (BID) | ORAL | 0 refills | Status: AC

## 2020-12-11 NOTE — Progress Notes (Signed)
Assessment & Plan:  1. Bacterial respiratory infection Discussed symptom management.  - amoxicillin-clavulanate (AUGMENTIN) 875-125 MG tablet; Take 1 tablet by mouth 2 (two) times daily for 7 days.  Dispense: 14 tablet; Refill: 0  2. Cough - Coronavirus (COVID-19) with Influenza A and Influenza B   Follow up plan: Return if symptoms worsen or fail to improve.  Hendricks Limes, MSN, APRN, FNP-C [facility]  Subjective:   Patient ID: Rick Lane, male    DOB: 1951/05/13, 70 y.o.   MRN: [id number]  HPI: Rick Lane is a 70 y.o. male presenting on 12/11/2020 for Cough (X 10 days ago), Nasal Congestion, and Fever (X 3 days )  Patient complains of cough, head congestion, sneezing, sore throat, fever and postnasal drainage.  Onset of symptoms was 10 days ago, a little better since that time. He is drinking plenty of fluids. Evaluation to date: none. Treatment to date: OTC. He has a history of asthma. He does not smoke. Patient has not been fully vaccinated against COVID-19.   ROS: Negative unless specifically indicated above in HPI.   Relevant past medical history reviewed and updated as indicated.   Allergies and medications reviewed and updated.   Current Outpatient Medications:  .  buPROPion (WELLBUTRIN XL) 150 MG 24 hr tablet, Take 1 tablet (150 mg total) by mouth daily., Disp: 90 tablet, Rfl: 3 .  cholecalciferol (VITAMIN D3) 25 MCG (1000 UNIT) tablet, Take 1,000 Units by mouth daily., Disp: , Rfl:  .  insulin degludec (TRESIBA FLEXTOUCH) 200 UNIT/ML FlexTouch Pen, Inject 60 Units into the skin at bedtime. (Patient taking differently: Inject 46 Units into the skin in the morning and at bedtime.), Disp: 9 mL, Rfl: 2 .  Insulin Syringe-Needle U-100 (RELION INSULIN SYR 0.3CC/30G) 30G X 5/16" 0.3 ML MISC, 1 Syringe by Does not apply route 3 (three) times daily. Dx E11.42, Disp: 100 each, Rfl: 11 .  Lecithin 1200 MG CAPS, Take 1,200 mg by mouth daily. Sunflower  Lecithin, Disp: , Rfl:  .  lisinopril (ZESTRIL) 5 MG tablet, Take 1 tablet (5 mg total) by mouth daily., Disp: 90 tablet, Rfl: 1 .  Multiple Vitamin (MULTIVITAMIN WITH MINERALS) TABS tablet, Take 1 tablet by mouth daily. Equate Multivitamin for Men 50+, Disp: , Rfl:  .  NOVOLOG FLEXPEN 100 UNIT/ML FlexPen, Inject 10 Units into the skin 3 (three) times daily before meals., Disp: 15 mL, Rfl: prn .  rosuvastatin (CRESTOR) 20 MG tablet, Take 1 tablet (20 mg total) by mouth daily., Disp: 90 tablet, Rfl: 3  Allergies  Allergen Reactions  . Bactrim [Sulfamethoxazole-Trimethoprim] Itching  . Gentamicin Other (See Comments)    Unknown    Objective:   BP 133/72   Pulse 76   Temp 98.5 F (36.9 C) (Temporal)   SpO2 93%    Physical Exam Vitals reviewed.  Constitutional:      General: He is not in acute distress.    Appearance: Normal appearance. He is not ill-appearing, toxic-appearing or diaphoretic.  HENT:     Head: Normocephalic and atraumatic.     Right Ear: Tympanic membrane, ear canal and external ear normal. There is no impacted cerumen.     Left Ear: Tympanic membrane, ear canal and external ear normal. There is no impacted cerumen.     Nose: Nose normal.     Mouth/Throat:     Mouth: Mucous membranes are moist.     Pharynx: Oropharynx is clear. No oropharyngeal exudate or posterior oropharyngeal erythema.  Eyes:     General: No scleral icterus.       Right eye: No discharge.        Left eye: No discharge.     Conjunctiva/sclera: Conjunctivae normal.  Cardiovascular:     Rate and Rhythm: Normal rate and regular rhythm.     Heart sounds: Normal heart sounds. No murmur heard. No friction rub. No gallop.   Pulmonary:     Effort: Pulmonary effort is normal. No respiratory distress.     Breath sounds: Normal breath sounds. No stridor. No wheezing, rhonchi or rales.  Musculoskeletal:        General: Normal range of motion.     Cervical back: Normal range of motion.   Lymphadenopathy:     Cervical: No cervical adenopathy.  Skin:    General: Skin is warm and dry.  Neurological:     Mental Status: He is alert and oriented to person, place, and time. Mental status is at baseline.  Psychiatric:        Mood and Affect: Mood normal.        Behavior: Behavior normal.        Thought Content: Thought content normal.        Judgment: Judgment normal.

## 2020-12-12 LAB — COVID-19, FLU A+B NAA
Influenza A, NAA: NOT DETECTED
Influenza B, NAA: NOT DETECTED
SARS-CoV-2, NAA: DETECTED — AB

## 2020-12-13 NOTE — Progress Notes (Signed)
Primary Care Physician:  Janora Norlander, DO  Primary GI: Dr. Gala Romney  Patient Location: Home   Provider Location: [facility] office   Reason for Visit:  Follow-up on elevated ALT and fatty liver   Persons present on the virtual encounter, with roles:  Aliene Altes, PA-C (provider), Rick Lane (patient)   Total time (minutes) spent on medical discussion: 11 minutes  Virtual Visit via Telephone Note Due to COVID-19, visit is conducted virtually and was requested by patient.   I connected with Rick Lane on 12/15/20 at 11:00 AM EDT by telephone and verified that I am speaking with the correct person using two identifiers.   I discussed the limitations, risks, security and privacy concerns of performing an evaluation and management service by telephone and the availability of in person appointments. I also discussed with the patient that there may be a patient responsible charge related to this service. The patient expressed understanding and agreed to proceed.  Chief Complaint  Patient presents with  . Follow-up    Elevated ALT. No complaints.     History of Present Illness: Rick Lane is a 70 y.o. male presenting today for follow-up of elevated ALT.  Mildly elevated ALT elevation dates back to 2017.  Brief normalization, but became elevated again in 2019.  Work-up thus far with RUQ ultrasound revealing diffuse steatosis.  Hepatitis A, B, and C negative.  He is immune to hepatitis A.  No immunity to hepatitis B.  Ferritin within normal limits.  Ceruloplasmin within normal limits.  ANA negative, ASMA negative, ANA positive with 1: 40 titer, cytoplasmic prior.  IgG slightly low at 554, IgA and IgM within normal limits.  Last seen in our office November 2021 and was doing well without any significant GI symptoms.  No signs or symptoms of decompensated liver disease.  Denies over-the-counter supplements, Tylenol, alcohol, or drug use.  Reported he just resumed Lipitor about 3 months  ago.  He has been off of this for about 6 months.  Notably, ALT did come down to 47 in March when he was off of Lipitor. Immunoglobulins were ordered with results as per above.  Suspected mildly elevated ALT likely secondary to fatty liver.  Lipitor could also be playing a role and recommended discussing with primary care about possibly trying a different cholesterol medication.  Also counseled on fatty liver.  Follow-up in 6 months.  Lipitor was changed to rosuvastatin.  Most recent labs 08/18/2020 with ALT 36, AST 22, alk phos 47.  Patient tested positive for COVID-19 on 5/19.  Today: Overall, he is feeling fairly well.  States he feels like he is getting upper chest cold.  Denies shortness of breath.  He has had a little cough.    No signs or symptoms of decompensated liver disease.  Denies swelling in abdomen, lower extremities, yellowing of the eyes or skin, bruising, bleeding.  In regards to fatty liver, states he has not sure if he is lost any weight, but his clothes are not fitting any looser.  Tries to limit fatty foods and sweets.  He has no GI concerns.  Denies abdominal pain, constipation, diarrhea, BRBPR, melena, nausea, vomiting, reflux symptoms, or dysphagia.  Denies alcohol, OTC medications aside from vitamin D and multivitamin, or Tylenol.   Past Medical History:  Diagnosis Date  . Asthma    ass a child  . Cataract   . 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 Depression   . Diabetes  mellitus without complication ([facility])    Type II  . Hyperlipidemia   . Neuropathy   . Neuropathy   . Sleep apnea      Past Surgical History:  Procedure Laterality Date  . AMPUTATION Right 12/12/2015   Procedure: Right Transmetatarsal Amputation With [facility] Placement;  Surgeon: [name], MD;  Location: [facility];  Service: Orthopedics;  Laterality: Right;  . AMPUTATION Right 03/05/2016   Procedure: RIGHT BELOW KNEE AMPUTATION;  Surgeon: [name], MD;  Location: [place];  Service: Orthopedics;  Laterality: Right;  . CATARACT EXTRACTION W/PHACO Right 10/02/2018   Procedure: CATARACT EXTRACTION PHACO AND INTRAOCULAR LENS PLACEMENT ([place]);  Surgeon: Baruch Goldmann, MD;  Location: [facility];  Service: Ophthalmology;  Laterality: Right;  CDE: 130.53  . COLONOSCOPY    . COLONOSCOPY N/A 12/19/2019   Procedure: COLONOSCOPY;  Surgeon: Daneil Dolin, MD;  Diverticulosis in the sigmoid and descending colon, redundant colon, otherwise normal exam.   . EYE SURGERY Left    Cataract  . EYE SURGERY Right    r  . ORBITAL RECONSTRUCTION Right   . STUMP REVISION Right 11/10/2016   Procedure: REVISION RIGHT BELOW KNEE AMPUTATION;  Surgeon: [name], MD;  Location: [place];  Service: Orthopedics;  Laterality: Right;  . toe removal Right 2012,2014     Current Meds  Medication Sig  . amoxicillin-clavulanate (AUGMENTIN) 875-125 MG tablet Take 1 tablet by mouth 2 (two) times daily for 7 days.  Marland Kitchen buPROPion (WELLBUTRIN XL) 150 MG 24 hr tablet Take 1 tablet (150 mg total) by mouth daily.  . cholecalciferol (VITAMIN D3) 25 MCG (1000 UNIT) tablet Take 1,000 Units by mouth daily.  . insulin degludec (TRESIBA FLEXTOUCH) 200 UNIT/ML FlexTouch Pen Inject 60 Units into the skin at bedtime. (Patient taking differently: Inject 60 Units into the skin daily.)  . Insulin Syringe-Needle U-100 (RELION INSULIN SYR 0.3CC/30G) 30G X 5/16" 0.3 ML MISC 1 Syringe by Does not apply route 3 (three) times daily. Dx W40.97  . lisinopril (ZESTRIL) 5 MG tablet Take 1 tablet (5 mg total) by mouth daily.  . Multiple Vitamin (MULTIVITAMIN WITH MINERALS) TABS tablet Take 1 tablet by mouth daily. Equate Multivitamin for Men 30+  . NOVOLOG FLEXPEN 100 UNIT/ML FlexPen Inject 10 Units into the skin 3 (three) times daily before meals.  . rosuvastatin (CRESTOR) 20 MG tablet Take 1 tablet (20 mg total) by mouth daily.     Family History  Problem Relation Age of Onset  . Cirrhosis Mother         secondary to alcohol abuse.   Marland Kitchen Heart disease Father   . Diabetes Sister   . Diabetes Brother   . Heart attack Paternal Grandfather   . Colon cancer Maternal Grandfather        passed in the mid-60's.    Social History   Socioeconomic History  . Marital status: Married    Spouse name: Not on file  . Number of children: 0  . Years of education: 81  . Highest education level: Not on file  Occupational History  . Occupation: Scientist, research (physical sciences)    Comment: [employer]  Tobacco Use  . Smoking status: Never Smoker  . Smokeless tobacco: Never Used  Vaping Use  . Vaping Use: Never used  Substance and Sexual Activity  . Alcohol use: No  . Drug use: No  . Sexual activity: Not Currently    Birth control/protection: None  Other Topics Concern  . Not on file  Social History Narrative  . Not on file   Social Determinants of Health   Financial Resource Strain: Not on file  Food Insecurity: Not on file  Transportation Needs: Not on file  Physical Activity: Not on file  Stress: Not on file  Social Connections: Not on file       Review of Systems: Gen: Denies fever, chills, lightheadedness, dizziness, presyncope, syncope. CV: Denies chest pain or palpitations. Resp: See HPI GI: see HPI Heme: See HPI  Observations/Objective: No distress. Alert and oriented. Pleasant. Unable to perform complete physical exam due to telephone encounter. No video available.    Assessment: 70 year old male presenting today for follow-up of elevated ALT and fatty liver. Mildly elevated ALT elevation dates back to 2017.  Brief normalization, but became elevated again in 2019.  Work-up thus far with RUQ ultrasound revealing diffuse steatosis.  Hepatitis A, B, and C negative.  He is immune to hepatitis A.  No immunity to hepatitis B.  Ferritin within normal limits.  Ceruloplasmin within normal limits.  ANA negative, ASMA negative, ANA positive with 1: 40 titer, cytoplasmic prior.  IgG slightly low at 554,  IgA and IgM within normal limits.  After his last visit in November 2021, Lipitor was changed to rosuvastatin by primary care.  Most recent labs January 2022 with LFTs all normal - ALT 36, AST 22, alk phos 47.  He denies any signs or symptoms of decompensated liver disease.  Also denies any significant upper or lower GI symptoms.  No alcohol, Tylenol, or over-the-counter supplements aside from multivitamin and vitamin D.  Suspected mildly elevated ALT secondary to fatty liver and med effect in the setting of Lipitor.   Discussed vaccination against Hep B. Patient prefers to think about this.   Plan: 1.  Repeat HFP week of February 09, 2021. 2.  Recommend repeating LFTs every 6 months x 1 year.  If LFTs remain normal, monitor yearly. 3.  Counseled on fatty liver.  Written instructions and separate handout provided. 4.  Continue to avoid alcohol, Tylenol, and over-the-counter supplements. 5.  Follow-up in 1 year or sooner if needed.  Follow Up Instructions: Follow-up in 1 year or sooner if needed.   I discussed the assessment and treatment plan with the patient. The patient was provided an opportunity to ask questions and all were answered. The patient agreed with the plan and demonstrated an understanding of the instructions.   The patient was advised to call back or seek an in-person evaluation if the symptoms worsen or if the condition fails to improve as anticipated.  I provided 11 minutes of non-face-to-face time during this encounter.  Aliene Altes, PA-C [facility]  12/15/2020

## 2020-12-15 ENCOUNTER — Encounter: Payer: Self-pay | Admitting: Gastroenterology

## 2020-12-15 ENCOUNTER — Other Ambulatory Visit: Payer: Self-pay

## 2020-12-15 ENCOUNTER — Ambulatory Visit (INDEPENDENT_AMBULATORY_CARE_PROVIDER_SITE_OTHER): Payer: Medicare HMO | Admitting: Gastroenterology

## 2020-12-15 ENCOUNTER — Other Ambulatory Visit: Payer: Self-pay | Admitting: *Deleted

## 2020-12-15 ENCOUNTER — Telehealth: Payer: Self-pay | Admitting: *Deleted

## 2020-12-15 DIAGNOSIS — K760 Fatty (change of) liver, not elsewhere classified: Secondary | ICD-10-CM

## 2020-12-15 DIAGNOSIS — R7401 Elevation of levels of liver transaminase levels: Secondary | ICD-10-CM | POA: Diagnosis not present

## 2020-12-15 NOTE — Patient Instructions (Addendum)
Please have blood work completed to recheck your liver enzymes on July 21 when you see your primary care provider for an office visit.   In general, I recommend we check your liver enzymes every 6 months for 1 year, then yearly if they remain within normal limits.   Continue to avoid alcohol, Tylenol, and over-the-counter supplements.  Instructions for fatty liver: Recommend 1-2# weight loss per week until ideal body weight through exercise & diet. Low fat/cholesterol diet.   Avoid sweets, sodas, fruit juices, sweetened beverages like tea, etc. Gradually increase exercise from 15 min daily up to 1 hr per day 5 days/week.  We will plan to see back in 1 year.  Please call sooner if you have any questions or concerns prior to your next appointment.   Aliene Altes, PA-C [facility]   Nonalcoholic Fatty Liver Disease Diet, Adult Nonalcoholic fatty liver disease is a condition that causes fat to build up in and around the liver. The disease makes it harder for the liver to work the way that it should. Following a healthy diet can help to keep nonalcoholic fatty liver disease under control. It can also help to prevent or improve conditions that are associated with the disease, such as heart disease, diabetes, high blood pressure, and abnormal cholesterol levels. Along with regular exercise, this diet:  Promotes weight loss.  Helps to control blood sugar levels.  Helps to improve the way that the body uses insulin. What are tips for following this plan? Reading food labels Always check food labels for:  The amount of saturated fat in a food. You should limit your intake of saturated fat. Saturated fat is found in foods that come from animals, including meat and dairy products such as butter, cheese, and whole milk.  The amount of fiber in a food. You should choose high-fiber foods such as fruits, vegetables, and whole grains. Try to get 25-30 grams (g) of fiber a day.    Cooking  When cooking, use heart-healthy oils that are high in monounsaturated fats. These include olive oil, canola oil, and avocado oil.  Limit frying or deep-frying foods. Cook foods using healthy methods such as baking, boiling, steaming, and grilling instead. Meal planning  You may want to keep track of how many calories you take in. Eating the right amount of calories will help you achieve a healthy weight. Meeting with a registered dietitian can help you get started.  Limit how often you eat takeout and fast food. These foods are usually very high in fat, salt, and sugar.  Use the glycemic index (GI) to plan your meals. The index tells you how quickly a food will raise your blood sugar. Choose low-GI foods (GI less than 55). These foods take a longer time to raise blood sugar. A registered dietitian can help you identify foods lower on the GI scale. Lifestyle  You may want to follow a Mediterranean diet. This diet includes a lot of vegetables, lean meats or fish, whole grains, fruits, and healthy oils and fats. What foods can I eat? Fruits Bananas. Apples. Oranges. Grapes. Papaya. Mango. Pomegranate. Kiwi. Grapefruit. Cherries. Vegetables Lettuce. Spinach. Peas. Beets. Cauliflower. Cabbage. Broccoli. Carrots. Tomatoes. Squash. Eggplant. Herbs. Peppers. Onions. Cucumbers. Brussels sprouts. Yams and sweet potatoes. Beans. Lentils. Grains Whole wheat or whole-grain foods, including breads, crackers, cereals, and pasta. Stone-ground whole wheat. Unsweetened oatmeal. Bulgur. Barley. Quinoa. Brown or wild rice. Corn or whole wheat flour tortillas. Meats and other proteins Lean meats. Poultry. Tofu. Seafood  and shellfish. Dairy Low-fat or fat-free dairy products, such as yogurt, cottage cheese, or cheese. Beverages Water. Sugar-free drinks. Tea. Coffee. Low-fat or skim milk. Milk alternatives, such as soy or almond milk. Real fruit juice. Fats and oils Avocado. Canola or olive oil.  Nuts and nut butters. Seeds. Seasonings and condiments Mustard. Relish. Low-fat, low-sugar ketchup and barbecue sauce. Low-fat or fat-free mayonnaise. Sweets and desserts Sugar-free sweets. The items listed above may not be a complete list of foods and beverages you can eat. Contact a dietitian for more information.   What foods should I limit or avoid? Meats and other proteins Limit red meat to 1-2 times a week. Dairy [employer]. Fats and oils Palm oil and coconut oil. Fried foods. Other foods Processed foods. Foods that contain a lot of salt or sodium. Sweets and desserts Sweets that contain sugar. Beverages Sweetened drinks, such as sweet tea, milkshakes, iced sweet drinks, and sodas. Alcohol. The items listed above may not be a complete list of foods and beverages you should avoid. Contact a dietitian for more information. Where to find more information The [employer] of Diabetes and Digestive and Kidney Diseases: [employer] Summary  Nonalcoholic fatty liver disease is a condition that causes fat to build up in and around the liver.  Following a healthy diet can help to keep nonalcoholic fatty liver disease under control. Your diet should be rich in fruits, vegetables, whole grains, and lean proteins.  Limit your intake of saturated fat. Saturated fat is found in foods that come from animals, including meat and dairy products such as butter, cheese, and whole milk.  This diet promotes weight loss, helps to control blood sugar levels, and helps to improve the way that the body uses insulin. This information is not intended to replace advice given to you by your health care provider. Make sure you discuss any questions you have with your health care provider. Document Revised: 11/03/2018 Document Reviewed: 08/03/2018 Elsevier Patient Education  [employer].

## 2020-12-15 NOTE — Telephone Encounter (Signed)
Rick Lane, you are scheduled for a virtual visit with your provider today.  Just as we do with appointments in the office, we must obtain your consent to participate.  Your consent will be active for this visit and any virtual visit you may have with one of our providers in the next 365 days.  If you have a MyChart account, I can also send a copy of this consent to you electronically.  All virtual visits are billed to your insurance company just like a traditional visit in the office.  As this is a virtual visit, video technology does not allow for your provider to perform a traditional examination.  This may limit your provider's ability to fully assess your condition.  If your provider identifies any concerns that need to be evaluated in person or the need to arrange testing such as labs, EKG, etc, we will make arrangements to do so.  Although advances in technology are sophisticated, we cannot ensure that it will always work on either your end or our end.  If the connection with a video visit is poor, we may have to switch to a telephone visit.  With either a video or telephone visit, we are not always able to ensure that we have a secure connection.   I need to obtain your verbal consent now.   Are you willing to proceed with your visit today?

## 2020-12-15 NOTE — Telephone Encounter (Signed)
Pt consented to a telephone visit. °

## 2020-12-15 NOTE — Progress Notes (Signed)
Cc'ed to pcp °

## 2020-12-16 ENCOUNTER — Ambulatory Visit: Payer: Medicare HMO | Admitting: Family Medicine

## 2020-12-26 ENCOUNTER — Telehealth: Payer: Self-pay | Admitting: Family Medicine

## 2020-12-26 NOTE — Telephone Encounter (Signed)
Patient aware to pick up patient assistance

## 2021-01-20 ENCOUNTER — Other Ambulatory Visit: Payer: Self-pay | Admitting: "Endocrinology

## 2021-01-20 DIAGNOSIS — E1129 Type 2 diabetes mellitus with other diabetic kidney complication: Secondary | ICD-10-CM

## 2021-01-23 ENCOUNTER — Other Ambulatory Visit: Payer: Self-pay | Admitting: "Endocrinology

## 2021-01-23 DIAGNOSIS — E1129 Type 2 diabetes mellitus with other diabetic kidney complication: Secondary | ICD-10-CM

## 2021-02-12 ENCOUNTER — Encounter: Payer: Self-pay | Admitting: Family Medicine

## 2021-02-12 ENCOUNTER — Ambulatory Visit (INDEPENDENT_AMBULATORY_CARE_PROVIDER_SITE_OTHER): Payer: Medicare HMO | Admitting: Family Medicine

## 2021-02-12 ENCOUNTER — Other Ambulatory Visit: Payer: Self-pay

## 2021-02-12 VITALS — BP 108/58 | HR 90 | Temp 97.9°F | Resp 20 | Ht 74.0 in | Wt 269.0 lb

## 2021-02-12 DIAGNOSIS — Z794 Long term (current) use of insulin: Secondary | ICD-10-CM

## 2021-02-12 DIAGNOSIS — R809 Proteinuria, unspecified: Secondary | ICD-10-CM | POA: Diagnosis not present

## 2021-02-12 DIAGNOSIS — E1129 Type 2 diabetes mellitus with other diabetic kidney complication: Secondary | ICD-10-CM

## 2021-02-12 DIAGNOSIS — Z89511 Acquired absence of right leg below knee: Secondary | ICD-10-CM | POA: Diagnosis not present

## 2021-02-12 DIAGNOSIS — R251 Tremor, unspecified: Secondary | ICD-10-CM

## 2021-02-12 DIAGNOSIS — E1169 Type 2 diabetes mellitus with other specified complication: Secondary | ICD-10-CM

## 2021-02-12 DIAGNOSIS — E785 Hyperlipidemia, unspecified: Secondary | ICD-10-CM | POA: Diagnosis not present

## 2021-02-12 DIAGNOSIS — K760 Fatty (change of) liver, not elsewhere classified: Secondary | ICD-10-CM | POA: Diagnosis not present

## 2021-02-12 DIAGNOSIS — E119 Type 2 diabetes mellitus without complications: Secondary | ICD-10-CM | POA: Diagnosis not present

## 2021-02-12 LAB — BAYER DCA HB A1C WAIVED: HB A1C (BAYER DCA - WAIVED): 6.1 % (ref <7.0)

## 2021-02-12 MED ORDER — NOVOLOG FLEXPEN 100 UNIT/ML ~~LOC~~ SOPN: PEN_INJECTOR | SUBCUTANEOUS | 12 refills | Status: DC

## 2021-02-12 NOTE — Progress Notes (Signed)
Subjective: CC:DM PCP: Janora Norlander, DO BZJ:IRCVELF Rick Lane is a 70 y.o. male presenting to clinic today for:  1. Type 2 Diabetes with microalbuminuria, hyperlipidemia:  He reports that his blood sugars have been running relatively well.  He did have a low blood sugar last night that woke him from sleep sweating that was 60.  He has been compliant with 60 units of Tresiba daily with 10 units of NovoLog with each meal.  He gets this through patient assistance program.  He rarely has a low, less than 1 time per month.  Had some cookies and milk to compensate for sugar this morning  Last eye exam: Up-to-date Last foot exam: Up-to-date Last A1c:  Lab Results  Component Value Date   HGBA1C 6.4 08/18/2020   Nephropathy screen indicated?:  On low-dose ACE inhibitor Last flu, zoster and/or pneumovax: There is no immunization history for the selected administration types on file for this patient.  ROS: Denies dizziness, chest pain, shortness of breath.  2 Tremor His wife noticed a tremor in his upper extremities recently and he wanted to ask about this today.  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 (Patient taking differently: Inject 60 Units into the skin daily.), Disp: 9 mL, Rfl: 2   Insulin Syringe-Needle U-100 (RELION INSULIN SYR 0.3CC/30G) 30G X 5/16" 0.3 ML MISC, 1 Syringe by Does not apply route 3 (three) times daily. Dx E11.42, Disp: 100 each, Rfl: 11   lisinopril (ZESTRIL) 5 MG tablet, Take 1 tablet by mouth once daily, Disp: 90 tablet, Rfl: 0   Multiple Vitamin (MULTIVITAMIN WITH MINERALS) TABS tablet, Take 1 tablet by mouth daily. Equate Multivitamin for Men 50+, Disp: , Rfl:    NOVOLOG FLEXPEN 100 UNIT/ML FlexPen, Inject 10 Units into the skin 3 (three) times daily before meals., Disp: 15 mL, Rfl: prn   rosuvastatin (CRESTOR) 20 MG tablet, Take 1 tablet (20 mg total) by mouth daily., Disp: 90 tablet, Rfl: 3 Social History   Socioeconomic History   Marital status: Married    Spouse name: Not on file   Number of children: 0   Years of education: 14   Highest education level: Not on file  Occupational History   Occupation: Scientist, research (physical sciences)    Comment: [employer]  Tobacco Use   Smoking status: Never   Smokeless tobacco: Never  Vaping Use   Vaping Use: Never used  Substance and Sexual Activity   Alcohol use: No   Drug use: No   Sexual activity: Not Currently    Birth control/protection: None  Other  Topics Concern   Not on file  Social History Narrative   Not on file   Social Determinants of Health   Financial Resource Strain: Not on file  Food Insecurity: Not on file  Transportation Needs: Not on file  Physical Activity: Not on file  Stress: Not on file  Social Connections: Not on file  Intimate Partner Violence: Not on file   Family History  Problem Relation Age of Onset   Cirrhosis Mother        secondary to alcohol abuse.    Heart disease Father    Diabetes Sister    Diabetes Brother    Heart attack Paternal Grandfather    Colon cancer Maternal Grandfather        passed in the mid-60's.    Objective: Office vital signs reviewed. BP (!)  108/58   Pulse 90   Temp 97.9 F (36.6 C)   Resp 20   Ht 6' 2"  (1.88 m)   Wt 269 lb (122 kg)   SpO2 96%   BMI 34.54 kg/m   Physical Examination:  General: Awake, alert, well nourished, No acute distress HEENT: Normal; sclera white Cardio: regular rate and rhythm, S1S2 heard, no murmurs appreciated Pulm: clear to auscultation bilaterally, no wheezes, rhonchi or rales; normal work of breathing on room air Extremities: Right BKA with prosthetic in place MSK: Ambulating independently Neuro: Resting tremor noted    Assessment/ Plan: 70 y.o. male   Type 2 diabetes mellitus with microalbuminuria, with long-term current use of insulin ([place]) - Plan: CMP14+EGFR, Bayer DCA Hb A1c Waived  Hyperlipidemia associated with type 2 diabetes mellitus ([place]) - Plan: CMP14+EGFR, Lipid panel  Hepatic steatosis - Plan: CMP14+EGFR, Lipid panel  S/P unilateral BKA (below knee amputation), right (HCC)  Tremor  Sugars under excellent control with A1c down to 6.1.  However, I do have concerns about hypoglycemic episodes that he describes.  Advised to continue 10 units of NovoLog with breakfast and lunch but I would like him to reduce to 8 or 9 units with supper depending on carb load.  If he continues to have breakthrough hypoglycemic episodes would favor lowering even further.  He will reach out to [facility] with regards to refill on NovoLog since he has 1 pen that was defective.  He will continue statin.  Collect fasting lipid panel given known hepatic steatosis.  Check CMP  Tremor was noted in bilateral upper extremities with left being more affected than right.  He had the tremor both at rest and slightly with activity.  Does not appear to have a wide-based gait or other parkinsonian features but we discussed that if symptoms were to worsen or start to involve his lower extremity that we should consider referral to neurology for such.  Would consider referral to Dr. Carles Collet since she specializes in movement  disorders.  Orders Placed This Encounter  Procedures   CMP14+EGFR   Lipid panel   Bayer DCA Hb A1c Waived   No orders of the defined types were placed in this encounter.    Janora Norlander, DO [place] [phone]

## 2021-02-12 NOTE — Patient Instructions (Signed)
Essential Tremor A tremor is trembling or shaking that a person cannot control. Most tremors affect the hands or arms. Tremors can also affect the head, vocal cords, legs, and other parts of the body. Essential tremor is a tremor without a known cause. Usually, it occurs while a person is trying to perform an action. Ittends to get worse gradually as a person ages. What are the causes? The cause of this condition is not known. What increases the risk? You are more likely to develop this condition if: You have a family member with essential tremor. You are age 40 or older. You take certain medicines. What are the signs or symptoms? The main sign of a tremor is a rhythmic shaking of certain parts of your body that is uncontrolled and unintentional. You may: Have difficulty eating with a spoon or fork. Have difficulty writing. Nod your head up and down or side to side. Have a quivering voice. The shaking may: Get worse over time. Come and go. Be more noticeable on one side of your body. Get worse due to stress, fatigue, caffeine, and extreme heat or cold. How is this diagnosed? This condition may be diagnosed based on: Your symptoms and medical history. A physical exam. There is no single test to diagnose an essential tremor. However, your health care provider may order tests to rule out other causes of your condition. These may include: Blood and urine tests. Imaging studies of your brain, such as CT scan and MRI. A test that measures involuntary muscle movement (electromyogram). How is this treated? Treatment for essential tremor depends on the severity of the condition. Some tremors may go away without treatment. Mild tremors may not need treatment if they do not affect your day-to-day life. Severe tremors may need to be treated using one or more of the following options: Medicines. Lifestyle changes. Occupational or physical therapy. Follow these instructions at  home: Lifestyle  Do not use any products that contain nicotine or tobacco, such as cigarettes and e-cigarettes. If you need help quitting, ask your health care provider. Limit your caffeine intake as told by your health care provider. Try to get 8 hours of sleep each night. Find ways to manage your stress that fits your lifestyle and personality. Consider trying meditation or yoga. Try to anticipate stressful situations and allow extra time to manage them. If you are struggling emotionally with the effects of your tremor, consider working with a mental health provider.  General instructions Take over-the-counter and prescription medicines only as told by your health care provider. Avoid extreme heat and extreme cold. Keep all follow-up visits as told by your health care provider. This is important. Visits may include physical therapy visits. Contact a health care provider if: You experience any changes in the location or intensity of your tremors. You start having a tremor after starting a new medicine. You have tremor with other symptoms, such as: Numbness. Tingling. Pain. Weakness. Your tremor gets worse. Your tremor interferes with your daily life. You feel down, blue, or sad for at least 2 weeks in a row. Worrying about your tremor and what other people think about you interferes with your everyday life functions, including relationships, work, or school. Summary Essential tremor is a tremor without a known cause. Usually, it occurs when you are trying to perform an action. You are more likely to develop this condition if you have a family member with essential tremor. The main sign of a tremor is a rhythmic shaking of   certain parts of your body that is uncontrolled and unintentional. Treatment for essential tremor depends on the severity of the condition. This information is not intended to replace advice given to you by your health care provider. Make sure you discuss any  questions you have with your healthcare provider. Document Revised: 04/04/2020 Document Reviewed: 04/04/2020 Elsevier Patient Education  2022 [employer]  

## 2021-02-13 LAB — CMP14+EGFR
ALT: 28 IU/L (ref 0–44)
AST: 21 IU/L (ref 0–40)
Albumin/Globulin Ratio: 2.3 — ABNORMAL HIGH (ref 1.2–2.2)
Albumin: 4.6 g/dL (ref 3.8–4.8)
Alkaline Phosphatase: 41 IU/L — ABNORMAL LOW (ref 44–121)
BUN/Creatinine Ratio: 21 (ref 10–24)
BUN: 23 mg/dL (ref 8–27)
Bilirubin Total: 0.4 mg/dL (ref 0.0–1.2)
CO2: 26 mmol/L (ref 20–29)
Calcium: 10 mg/dL (ref 8.6–10.2)
Chloride: 105 mmol/L (ref 96–106)
Creatinine, Ser: 1.1 mg/dL (ref 0.76–1.27)
Globulin, Total: 2 g/dL (ref 1.5–4.5)
Glucose: 94 mg/dL (ref 65–99)
Potassium: 4.6 mmol/L (ref 3.5–5.2)
Sodium: 145 mmol/L — ABNORMAL HIGH (ref 134–144)
Total Protein: 6.6 g/dL (ref 6.0–8.5)
eGFR: 73 mL/min/{1.73_m2} (ref >59)

## 2021-02-13 LAB — LIPID PANEL
Chol/HDL Ratio: 3.2 ratio (ref 0.0–5.0)
Cholesterol, Total: 138 mg/dL (ref 100–199)
HDL: 43 mg/dL (ref >39)
LDL Chol Calc (NIH): 69 mg/dL (ref 0–99)
Triglycerides: 147 mg/dL (ref 0–149)
VLDL Cholesterol Cal: 26 mg/dL (ref 5–40)

## 2021-02-16 ENCOUNTER — Ambulatory Visit: Payer: Medicare HMO

## 2021-03-06 ENCOUNTER — Ambulatory Visit (INDEPENDENT_AMBULATORY_CARE_PROVIDER_SITE_OTHER): Payer: Medicare HMO | Admitting: Pharmacist

## 2021-03-06 DIAGNOSIS — R809 Proteinuria, unspecified: Secondary | ICD-10-CM

## 2021-03-06 DIAGNOSIS — E1129 Type 2 diabetes mellitus with other diabetic kidney complication: Secondary | ICD-10-CM | POA: Diagnosis not present

## 2021-03-06 DIAGNOSIS — Z794 Long term (current) use of insulin: Secondary | ICD-10-CM

## 2021-03-20 NOTE — Progress Notes (Signed)
Chronic Care Management Pharmacy Note  03/06/2021 Name:  Rick Lane MRN:  [id number] DOB:  06-14-1951  Summary: DIABETES/PATIENT ASSISTANCE  Recommendations/Changes made from today's visit: Diabetes: Controlled; current treatment:tresiba, novolog (sliding scale 8-10 units with meals; A1c 6.1%, GFR 73 Current glucose readings: fasting glucose: <130, post prandial glucose: <180 Patient is not interested in CGM at this time, he is content with traditional fingersticks Consider GLP1 in the place of novolog--patient does not want to change therapy at this time Denies hypoglycemic/hyperglycemic symptoms Discussed meal planning options and Plate method for healthy eating Avoid sugary drinks and desserts Incorporate balanced protein, non starchy veggies, 1 serving of carbohydrate with each meal Increase water intake Increase physical activity as able Current exercise: n/a Recommended consider GLP1 as above Assessed patient finances. Enrolled patient in the [place]isk patient assistance program (tresiba & novolog)--4 month supply arrived this week).  Medication will continue to ship to PCP office, must re-enroll for next year  Follow Up Plan: Telephone follow up appointment with care management team member scheduled for: 3 months  Subjective: Rick Lane is an 70 y.o. year old male who is a primary patient of Janora Norlander, DO.  The CCM team was consulted for assistance with disease management and care coordination needs.    Engaged with patient by telephone for initial visit in response to provider referral for pharmacy case management and/or care coordination services.   Consent to Services:  The patient was given the following information about Chronic Care Management services today, agreed to services, and gave verbal consent: 1. CCM service includes personalized support from designated clinical staff supervised by the primary care provider, including individualized plan of  care and coordination with other care providers 2. 24/7 contact phone numbers for assistance for urgent and routine care needs. 3. Service will only be billed when office clinical staff spend 20 minutes or more in a month to coordinate care. 4. Only one practitioner may furnish and bill the service in a calendar month. 5.The patient may stop CCM services at any time (effective at the end of the month) by phone call to the office staff. 6. The patient will be responsible for cost sharing (co-pay) of up to 20% of the service fee (after annual deductible is met). Patient agreed to services and consent obtained.  Patient Care Team: Janora Norlander, DO as PCP - General (Family Medicine) Gala Romney Cristopher Estimable, MD as Consulting Physician (Gastroenterology) Lavera Guise, [facility] (Pharmacist)  Objective:  Lab Results  Component Value Date   CREATININE 1.10 02/12/2021   CREATININE 1.19 08/18/2020   CREATININE 1.19 05/08/2020    Lab Results  Component Value Date   HGBA1C 6.1 02/12/2021   Last diabetic Eye exam:  Lab Results  Component Value Date/Time   HMDIABEYEEXA No Retinopathy 03/18/2020 12:00 AM    Last diabetic Foot exam: No results found for: HMDIABFOOTEX      Component Value Date/Time   CHOL 138 02/12/2021 1111   TRIG 147 02/12/2021 1111   HDL 43 02/12/2021 1111   CHOLHDL 3.2 02/12/2021 1111   LDLCALC 69 02/12/2021 1111   LDLDIRECT 84 06/27/2019 1339    Hepatic Function Latest Ref Rng & Units 02/12/2021 08/18/2020 05/08/2020  Total Protein 6.0 - 8.5 g/dL 6.6 6.6 6.5  Albumin 3.8 - 4.8 g/dL 4.6 4.4 4.4  AST 0 - 40 IU/L _0 ALT 0 - 44 IU/L 28 36 57(H)  Alk Phosphatase 44 - 121 IU/L 41(L) 47 44  Total Bilirubin 0.0 - 1.2 mg/dL 0.4 0.4 0.4  Bilirubin, Direct 0.0 - 0.2 mg/dL - - -    Lab Results  Component Value Date/Time   TSH 2.390 08/11/2015 02:17 PM   FREET4 0.97 08/11/2015 02:17 PM    CBC Latest Ref Rng & Units 10/24/2019 04/28/2017 11/10/2016  WBC 3.8 - 10.8  Thousand/uL 5.9 4.9 6.3  Hemoglobin 13.2 - 17.1 g/dL 15.4 13.4 13.6  Hematocrit 38.5 - 50.0 % 46.0 41.4 41.3  Platelets 140 - 400 Thousand/uL 193 184 192    Lab Results  Component Value Date/Time   VD25OH 37.4 04/28/2017 02:56 PM    Clinical ASCVD: No  The 10-year ASCVD risk score Mikey Bussing DC Jr., et al., 2013) is: 23.6%   Values used to calculate the score:     Age: 71 years     Sex: Male     Is Non-Hispanic African American: No     Diabetic: Yes     Tobacco smoker: No     Systolic Blood Pressure: 128 mmHg     Is BP treated: Yes     HDL Cholesterol: 43 mg/dL     Total Cholesterol: 138 mg/dL    Other: (CHADS2VASc if Afib, PHQ9 if depression, MMRC or CAT for COPD, ACT, DEXA)  Social History   Tobacco Use  Smoking Status Never  Smokeless Tobacco Never   BP Readings from Last 3 Encounters:  02/12/21 (!) 108/58  12/11/20 133/72  08/18/20 135/67   Pulse Readings from Last 3 Encounters:  02/12/21 90  12/11/20 76  08/18/20 (!) 59   Wt Readings from Last 3 Encounters:  02/12/21 269 lb (122 kg)  08/18/20 273 lb (123.8 kg)  08/06/20 280 lb (127 kg)    Assessment: Review of patient past medical history, allergies, medications, health status, including review of consultants reports, laboratory and other test data, was performed as part of comprehensive evaluation and provision of chronic care management services.   SDOH:  (Social Determinants of Health) assessments and interventions performed:    CCM Care Plan  Allergies  Allergen Reactions   Bactrim [Sulfamethoxazole-Trimethoprim] Itching   Gentamicin Other (See Comments)    Unknown    Medications Reviewed Today     Reviewed by Lavera Guise, [facility] (Pharmacist) on 03/20/21 at 0850  Med List Status: <None>   Medication Order Taking? Sig Documenting Provider Last Dose Status Informant  buPROPion (WELLBUTRIN XL) 150 MG 24 hr tablet [id number] No Take 1 tablet (150 mg total) by mouth daily. Ronnie Doss M, DO  Taking Active   cholecalciferol (VITAMIN D3) 25 MCG (1000 UNIT) tablet [id number] No Take 1,000 Units by mouth daily. [provider] Taking Active Self  insulin degludec (TRESIBA FLEXTOUCH) 200 UNIT/ML FlexTouch Pen [id number] No Inject 60 Units into the skin at bedtime.  Patient taking differently: Inject 60 Units into the skin daily.   Cassandria Anger, MD Taking Active            Med Note Blanca Friend, Royce Macadamia   Fri Mar 06, 2021  8:54 AM) Via novo nordisk patient assistance program   Insulin Syringe-Needle U-100 (RELION INSULIN SYR 0.3CC/30G) 30G X 5/16" 0.3 ML MISC [id number] No 1 Syringe by Does not apply route 3 (three) times daily. Dx E11.42 Janora Norlander, DO Taking Active Self  lisinopril (ZESTRIL) 5 MG tablet [id number] No Take 1 tablet by mouth once daily Cassandria Anger, MD Taking Active   Multiple Vitamin (MULTIVITAMIN WITH MINERALS) TABS tablet [id number] No  Take 1 tablet by mouth daily. Equate Multivitamin for Men 50+ [provider] Taking Active Self  NOVOLOG FLEXPEN 100 UNIT/ML FlexPen [id number]  Inject 10 Units into the skin 2 (two) times daily with breakfast and lunch AND 8-9 Units daily with supper. Janora Norlander, DO  Active            Med Note Blanca Friend, Royce Macadamia   Fri Mar 06, 2021  8:54 AM) Via novo nordisk patient assistance program  rosuvastatin (CRESTOR) 20 MG tablet [id number] No Take 1 tablet (20 mg total) by mouth daily. Janora Norlander, DO Taking Active             Patient Active Problem List   Diagnosis Date Noted   Hepatic steatosis 12/15/2020   Nocturia 08/06/2020   Benign prostatic hyperplasia with urinary obstruction 08/06/2020   Weak urinary stream 08/06/2020   Elevated ALT measurement 06/16/2020   Mixed hyperlipidemia 05/22/2020   Essential hypertension, benign 05/22/2020   Cough 04/03/2020   Preventative health care 10/30/2019   Elevated LFTs 10/30/2019   Microalbuminuria due to type 2 diabetes mellitus ([place])  03/27/2019   Diabetic ulcer of left midfoot associated with type 2 diabetes mellitus, with fat layer exposed ([place]) 10/10/2018   Influenza vaccine refused 05/29/2018   Chest discomfort 11/27/2016   DOE (dyspnea on exertion) 11/25/2016   Hx of right BKA ([place]) 03/05/2016   S/P unilateral BKA (below knee amputation), right ([place]) 12/12/2015   Healthcare maintenance 11/14/2015   Hyperlipidemia associated with type 2 diabetes mellitus ([place]) 11/14/2015   Diabetes mellitus ([place]) 08/11/2015   Depression 08/11/2015   Vitamin D deficiency 08/11/2015   Asthma 08/11/2015    There is no immunization history for the selected administration types on file for this patient.  Conditions to be addressed/monitored: DMII  Care Plan : PHARMD MEDICATION MANAGEMENT  Updates made by Lavera Guise, RPH since 03/20/2021 12:00 AM     Problem: DISEASE PROGRESSION PREVENTION      Long-Range Goal: T2DM   This Visit's Progress: On track  Priority: High  Note:   Current Barriers:  Unable to independently afford treatment regimen Unable to maintain control of T2DM   Pharmacist Clinical Goal(s):  Over the next 90 days, patient will verbalize ability to afford treatment regimen maintain control of T2DM as evidenced by IMPROVED GLYCEMIC CONTROL  through collaboration with PharmD and provider.    Interventions: 1:1 collaboration with Janora Norlander, DO regarding development and update of comprehensive plan of care as evidenced by provider attestation and co-signature Inter-disciplinary care team collaboration (see longitudinal plan of care) Comprehensive medication review performed; medication list updated in electronic medical record  Diabetes: Controlled; current treatment:tresiba, novolog (sliding scale 8-10 units with meals; A1c 6.1%, GFR 73 Current glucose readings: fasting glucose: <130, post prandial glucose: <180 Patient is not interested in CGM at this time, he is content with traditional  fingersticks Consider GLP1 in the place of novolog--patient does not want to change therapy at this time Denies hypoglycemic/hyperglycemic symptoms Discussed meal planning options and Plate method for healthy eating Avoid sugary drinks and desserts Incorporate balanced protein, non starchy veggies, 1 serving of carbohydrate with each meal Increase water intake Increase physical activity as able Current exercise: n/a Recommended consider GLP1 as above Assessed patient finances. Enrolled patient in the novo nordisk patient assistance program (tresiba & novolog)--4 month supply arrived this week).  Medication will continue to ship to PCP office, must re-enroll for next year   Patient Goals/Self-Care  Activities Over the next 90 days, patient will:  - take medications as prescribed check glucose DAILY FASTING, document, and provide at future appointments collaborate with provider on medication access solutions  Follow Up Plan: Telephone follow up appointment with care management team member scheduled for: 3 months     Medication Assistance:  TRESIBA/NOVOLOG obtained through [place] medication assistance program.  Enrollment ends 07/25/21--LAST REFILL REQUEST MUST BE SENT BEFORE 11/30  Patient's preferred pharmacy is:  [facility] [address] [place] [address] Phone: [phone] Fax: [phone]  Follow Up:  Patient agrees to Care Plan and Follow-up.  Plan: Telephone follow up appointment with care management team member scheduled for:  06/24/2021  Regina Eck, PharmD, BCPS Clinical Pharmacist, [place]  II Phone [phone]

## 2021-03-20 NOTE — Patient Instructions (Signed)
Visit Information  PATIENT GOALS:  Goals Addressed               This Visit's Progress     Patient Stated     T2DM PHARMD GOAL (pt-stated)        Current Barriers:  Unable to independently afford treatment regimen Unable to maintain control of T2DM   Pharmacist Clinical Goal(s):  Over the next 90 days, patient will verbalize ability to afford treatment regimen maintain control of T2DM as evidenced by IMPROVED GLYCEMIC CONTROL  through collaboration with PharmD and provider.    Interventions: 1:1 collaboration with Janora Norlander, DO regarding development and update of comprehensive plan of care as evidenced by provider attestation and co-signature Inter-disciplinary care team collaboration (see longitudinal plan of care) Comprehensive medication review performed; medication list updated in electronic medical record  Diabetes: Controlled; current treatment:tresiba, novolog (sliding scale 8-10 units with meals; A1c 6.1%, GFR 73 Current glucose readings: fasting glucose: <130, post prandial glucose: <180 Patient is not interested in CGM at this time, he is content with traditional fingersticks Consider GLP1 in the place of novolog--patient does not want to change therapy at this time Denies hypoglycemic/hyperglycemic symptoms Discussed meal planning options and Plate method for healthy eating Avoid sugary drinks and desserts Incorporate balanced protein, non starchy veggies, 1 serving of carbohydrate with each meal Increase water intake Increase physical activity as able Current exercise: n/a Recommended consider GLP1 as above Assessed patient finances. Enrolled patient in the novo nordisk patient assistance program (tresiba & novolog)--4 month supply arrived this week).  Medication will continue to ship to PCP office, must re-enroll for next year   Patient Goals/Self-Care Activities Over the next 90 days, patient will:  - take medications as prescribed check glucose  DAILY FASTING, document, and provide at future appointments collaborate with provider on medication access solutions  Follow Up Plan: Telephone follow up appointment with care management team member scheduled for: 3 months        The patient verbalized understanding of instructions, educational materials, and care plan provided today and declined offer to receive copy of patient instructions, educational materials, and care plan.   Telephone follow up appointment with care management team member scheduled for:06/24/21  Signature Regina Eck, PharmD, BCPS Clinical Pharmacist, [place]  II Phone [phone]

## 2021-03-23 ENCOUNTER — Other Ambulatory Visit: Payer: Self-pay | Admitting: "Endocrinology

## 2021-03-23 DIAGNOSIS — E1129 Type 2 diabetes mellitus with other diabetic kidney complication: Secondary | ICD-10-CM

## 2021-06-11 ENCOUNTER — Other Ambulatory Visit: Payer: Self-pay | Admitting: Family Medicine

## 2021-06-15 ENCOUNTER — Ambulatory Visit (INDEPENDENT_AMBULATORY_CARE_PROVIDER_SITE_OTHER): Payer: Medicare HMO | Admitting: Family Medicine

## 2021-06-15 ENCOUNTER — Other Ambulatory Visit: Payer: Self-pay

## 2021-06-15 ENCOUNTER — Encounter: Payer: Self-pay | Admitting: Family Medicine

## 2021-06-15 VITALS — BP 141/86 | HR 78 | Temp 97.9°F | Ht 74.0 in | Wt 271.4 lb

## 2021-06-15 DIAGNOSIS — K760 Fatty (change of) liver, not elsewhere classified: Secondary | ICD-10-CM

## 2021-06-15 DIAGNOSIS — R809 Proteinuria, unspecified: Secondary | ICD-10-CM | POA: Diagnosis not present

## 2021-06-15 DIAGNOSIS — E1169 Type 2 diabetes mellitus with other specified complication: Secondary | ICD-10-CM | POA: Diagnosis not present

## 2021-06-15 DIAGNOSIS — Z89511 Acquired absence of right leg below knee: Secondary | ICD-10-CM | POA: Diagnosis not present

## 2021-06-15 DIAGNOSIS — E785 Hyperlipidemia, unspecified: Secondary | ICD-10-CM | POA: Diagnosis not present

## 2021-06-15 DIAGNOSIS — E1129 Type 2 diabetes mellitus with other diabetic kidney complication: Secondary | ICD-10-CM | POA: Diagnosis not present

## 2021-06-15 DIAGNOSIS — Z794 Long term (current) use of insulin: Secondary | ICD-10-CM

## 2021-06-15 LAB — BAYER DCA HB A1C WAIVED: HB A1C (BAYER DCA - WAIVED): 5.8 % — ABNORMAL HIGH (ref 4.8–5.6)

## 2021-06-15 MED ORDER — LISINOPRIL 5 MG PO TABS: 5.0000 mg | ORAL_TABLET | Freq: Every day | ORAL | 3 refills | Status: DC

## 2021-06-15 NOTE — Progress Notes (Signed)
Subjective: CC:DM. PCP: Janora Norlander, DO Rick Lane is a 70 y.o. male presenting to clinic today for:  1. Type 2 Diabetes with hypertension, hyperlipidemia:  History of BKA on the right.   He is compliant with 60 units of Tresiba daily.  Uses anywhere between 8 and 10 units of NovoLog as needed meals.  Denies any hypoglycemic episodes.  He admits to some weight gain and his wife has been concerned about this.  Has been out of his lisinopril and needs a refill.  Compliant with Crestor.  Denies any foot lesions.  No concerns with his BKA on the right, denying any skin breakdown of the stump or irritation.  Does not need any new hardware.   Last eye exam: needs Last foot exam: needs Last A1c:  Lab Results  Component Value Date   HGBA1C 6.1 02/12/2021   Nephropathy screen indicated?: known microalbuminuria Last flu, zoster and/or pneumovax: There is no immunization history for the selected administration types on file for this patient.   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 (Patient taking differently: Inject 60 Units into the skin daily.), Disp: 9 mL, Rfl: 2   Insulin Syringe-Needle U-100 (RELION INSULIN SYR  0.3CC/30G) 30G X 5/16" 0.3 ML MISC, 1 Syringe by Does not apply route 3 (three) times daily. Dx E11.42, Disp: 100 each, Rfl: 11   lisinopril (ZESTRIL) 5 MG tablet, Take 1 tablet by mouth once daily, Disp: 90 tablet, Rfl: 0   Multiple Vitamin (MULTIVITAMIN WITH MINERALS) TABS tablet, Take 1 tablet by mouth daily. Equate Multivitamin for Men 50+, Disp: , Rfl:    NOVOLOG FLEXPEN 100 UNIT/ML FlexPen, Inject 10 Units into the skin 2 (two) times daily with breakfast and lunch AND 8-9 Units daily with supper., Disp: 15 mL, Rfl: 12   rosuvastatin (CRESTOR) 20 MG tablet, Take 1 tablet by mouth once daily, Disp: 90 tablet, Rfl: 0 Social History   Socioeconomic History   Marital status: Married    Spouse name: Not on file   Number of children: 0   Years of education: 14   Highest education level: Not on file  Occupational History   Occupation: Scientist, research (physical sciences)    Comment: [employer]  Tobacco Use   Smoking status: Never   Smokeless tobacco: Never  Vaping Use   Vaping Use: Never used  Substance and Sexual Activity   Alcohol use: No   Drug use: No   Sexual activity: Not Currently    Birth control/protection: None  Other Topics Concern   Not on file  Social History Narrative   Not on file   Social Determinants  of Health   Financial Resource Strain: Not on file  Food Insecurity: Not on file  Transportation Needs: Not on file  Physical Activity: Not on file  Stress: Not on file  Social Connections: Not on file  Intimate Partner Violence: Not on file   Family History  Problem Relation Age of Onset   Cirrhosis Mother        secondary to alcohol abuse.    Heart disease Father    Diabetes Sister    Diabetes Brother    Heart attack Paternal Grandfather    Colon cancer Maternal Grandfather        passed in the mid-60's.    Objective: Office vital signs reviewed. BP (!) 141/86   Pulse 78   Temp 97.9 F (36.6 C)   Ht 6\' 2"  (1.88 m)   Wt 271 lb 6.4 oz (123.1 kg)   SpO2 96%   BMI 34.85  kg/m   Physical Examination:  General: Awake, alert, well-appearing male, No acute distress HEENT: Normal; sclera white Cardio: regular rate and rhythm, S1S2 heard, no murmurs appreciated Pulm: clear to auscultation bilaterally, no wheezes, rhonchi or rales; normal work of breathing on room air Extremities: Left lower extremity is warm, well perfused, No edema, cyanosis or clubbing; +2 pulse  MSK: Right lower extremity is surgically absent below the knee. Neuro: See diabetic foot exam  Diabetic Foot Exam - Simple   Simple Foot Form Diabetic Foot exam was performed with the following findings: Yes 06/15/2021  1:04 PM  Visual Inspection No deformities, no ulcerations, no other skin breakdown bilaterally: Yes Sensation Testing See comments: Yes Pulse Check Posterior Tibialis and Dorsalis pulse intact bilaterally: Yes Comments Absent monofilament sensation throughout the entire left foot.  No ulcerations or preulcerative calluses appreciated.  Right foot surgically absent    Assessment/ Plan: 70 y.o. male   Type 2 diabetes mellitus with microalbuminuria, with long-term current use of insulin (HCC) - Plan: Bayer DCA Hb A1c Waived  Hyperlipidemia associated with type 2 diabetes mellitus ([place])  Microalbuminuria due to type 2 diabetes mellitus ([place]) - Plan: lisinopril (ZESTRIL) 5 MG tablet  Hepatic steatosis  S/P unilateral BKA (below knee amputation), right (HCC)  Sugar under excellent control.  No changes needed.  No hypoglycemic episodes.  Continue statin.  Not yet due for fasting lipid  Known microalbuminuria.  Lisinopril renewed.  Fatty liver noted.  We discussed weight loss.  Could consider GLP in this patient.  Would not recommend [place] or [place] given history of amputation of the right lower extremity.  He has an appointment coming up with Almyra Free for recertification of patient assistance for [place].  I will reach out to her to see if perhaps a GLP might be something  that we can help him with as well.  Would certainly recommend backing off on the insulin if we add that class  Declined any new hardware for his prosthetic on the right.  No orders of the defined types were placed in this encounter.  No orders of the defined types were placed in this encounter.    Janora Norlander, DO [place] [phone]

## 2021-06-24 ENCOUNTER — Ambulatory Visit (INDEPENDENT_AMBULATORY_CARE_PROVIDER_SITE_OTHER): Payer: Medicare HMO | Admitting: Pharmacist

## 2021-06-24 DIAGNOSIS — E785 Hyperlipidemia, unspecified: Secondary | ICD-10-CM

## 2021-06-24 DIAGNOSIS — Z794 Long term (current) use of insulin: Secondary | ICD-10-CM

## 2021-06-24 DIAGNOSIS — R809 Proteinuria, unspecified: Secondary | ICD-10-CM

## 2021-06-24 DIAGNOSIS — E1129 Type 2 diabetes mellitus with other diabetic kidney complication: Secondary | ICD-10-CM

## 2021-06-24 DIAGNOSIS — E1169 Type 2 diabetes mellitus with other specified complication: Secondary | ICD-10-CM

## 2021-06-24 NOTE — Patient Instructions (Signed)
Visit Information  Thank you for taking time to visit with me today. Please don't hesitate to contact me if I can be of assistance to you before our next scheduled telephone appointment.  Following are the goals we discussed today:  Diabetes: Controlled; current treatment: Tresiba 60units, novolog (sliding scale 8-10 units with meals; A1c 5.8%, GFR 73 Current glucose readings: fasting glucose: <130, post prandial glucose: <180 Patient is not interested in CGM at this time, he is content with traditional fingersticks Consider GLP1 in the place of novolog for CV benefit--patient does not want to change therapy at this time Content and stable Denies hypoglycemic/hyperglycemic symptoms Discussed meal planning options and Plate method for healthy eating Avoid sugary drinks and desserts Incorporate balanced protein, non starchy veggies, 1 serving of carbohydrate with each meal Increase water intake Increase physical activity as able Current exercise: n/a Cholesterol now at goal LDL<70-continue rosuvastatin Recommended consider GLP1 as above Assessed patient finances. Re-Enrolled patient in the novo nordisk patient assistance program (tresiba & novolog)  Medication will continue to ship to PCP office   Patient Goals/Self-Care Activities Over the next 90 days, patient will:  - take medications as prescribed check glucose DAILY FASTING, document, and provide at future appointments collaborate with provider on medication access solutions  Follow Up Plan: Telephone follow up appointment with care management team member scheduled for: 3 months  Please call the care guide team at [phone] if you need to cancel or reschedule your appointment.    The patient verbalized understanding of instructions, educational materials, and care plan provided today and declined offer to receive copy of patient instructions, educational materials, and care plan.   Regina Eck, PharmD,  BCPS Clinical Pharmacist, [place]  II Phone [phone]

## 2021-06-24 NOTE — Progress Notes (Signed)
Chronic Care Management Pharmacy Note  06/24/2021 Name:  Neyland Pettengill MRN:  [id number] DOB:  03-Apr-1951  Summary: T2DM  Recommendations/Changes made from today's visit:  Diabetes: Controlled; current treatment: Tresiba 60units, novolog (sliding scale 8-10 units with meals; A1c 5.8%, GFR 73 Current glucose readings: fasting glucose: <130, post prandial glucose: <180 Patient is not interested in CGM at this time, he is content with traditional fingersticks Consider GLP1 in the place of novolog for CV benefit--patient does not want to change therapy at this time Content and stable Denies hypoglycemic/hyperglycemic symptoms Discussed meal planning options and Plate method for healthy eating Avoid sugary drinks and desserts Incorporate balanced protein, non starchy veggies, 1 serving of carbohydrate with each meal Increase water intake Increase physical activity as able Current exercise: n/a Cholesterol now at goal LDL<70-continue rosuvastatin Recommended consider GLP1 as above Assessed patient finances. Re-Enrolled patient in the [employer] patient assistance program (tresiba & novolog)  Medication will continue to ship to PCP office  Patient Goals/Self-Care Activities Over the next 90 days, patient will:  - take medications as prescribed check glucose DAILY FASTING, document, and provide at future appointments collaborate with provider on medication access solutions  Follow Up Plan: Telephone follow up appointment with care management team member scheduled for: 3 months  Subjective: Michel Hendon is an 70 y.o. year old male who is a primary patient of Janora Norlander, DO.  The CCM team was consulted for assistance with disease management and care coordination needs.    Engaged with patient by telephone for follow up visit in response to provider referral for pharmacy case management and/or care coordination services.   Consent to Services:  The patient was given  information about Chronic Care Management services, agreed to services, and gave verbal consent prior to initiation of services.  Please see initial visit note for detailed documentation.   Patient Care Team: Janora Norlander, DO as PCP - General (Family Medicine) Gala Romney Cristopher Estimable, MD as Consulting Physician (Gastroenterology) Lavera Guise, [facility] (Pharmacist)  Objective:  Lab Results  Component Value Date   CREATININE 1.10 02/12/2021   CREATININE 1.19 08/18/2020   CREATININE 1.19 05/08/2020    Lab Results  Component Value Date   HGBA1C 5.8 (H) 06/15/2021   Last diabetic Eye exam:  Lab Results  Component Value Date/Time   HMDIABEYEEXA No Retinopathy 03/18/2020 12:00 AM    Last diabetic Foot exam: No results found for: HMDIABFOOTEX      Component Value Date/Time   CHOL 138 02/12/2021 1111   TRIG 147 02/12/2021 1111   HDL 43 02/12/2021 1111   CHOLHDL 3.2 02/12/2021 1111   LDLCALC 69 02/12/2021 1111   LDLDIRECT 84 06/27/2019 1339    Hepatic Function Latest Ref Rng & Units 02/12/2021 08/18/2020 05/08/2020  Total Protein 6.0 - 8.5 g/dL 6.6 6.6 6.5  Albumin 3.8 - 4.8 g/dL 4.6 4.4 4.4  AST 0 - 40 IU/L 21 22 27   ALT 0 - 44 IU/L 28 36 57(H)  Alk Phosphatase 44 - 121 IU/L 41(L) 47 44  Total Bilirubin 0.0 - 1.2 mg/dL 0.4 0.4 0.4  Bilirubin, Direct 0.0 - 0.2 mg/dL - - -    Lab Results  Component Value Date/Time   TSH 2.390 08/11/2015 02:17 PM   FREET4 0.97 08/11/2015 02:17 PM    CBC Latest Ref Rng & Units 10/24/2019 04/28/2017 11/10/2016  WBC 3.8 - 10.8 Thousand/uL 5.9 4.9 6.3  Hemoglobin 13.2 - 17.1 g/dL 15.4 13.4 13.6  Hematocrit 38.5 -  50.0 % 46.0 41.4 41.3  Platelets 140 - 400 Thousand/uL 193 184 192    Lab Results  Component Value Date/Time   VD25OH 37.4 04/28/2017 02:56 PM    Clinical ASCVD: No  The 10-year ASCVD risk score (Arnett DK, et al., 2019) is: 35.3%   Values used to calculate the score:     Age: 70 years     Sex: Male     Is Non-Hispanic African  American: No     Diabetic: Yes     Tobacco smoker: No     Systolic Blood Pressure: 850 mmHg     Is BP treated: Yes     HDL Cholesterol: 43 mg/dL     Total Cholesterol: 138 mg/dL    Other: (CHADS2VASc if Afib, PHQ9 if depression, MMRC or CAT for COPD, ACT, DEXA)  Social History   Tobacco Use  Smoking Status Never  Smokeless Tobacco Never   BP Readings from Last 3 Encounters:  06/15/21 (!) 141/86  02/12/21 (!) 108/58  12/11/20 133/72   Pulse Readings from Last 3 Encounters:  06/15/21 78  02/12/21 90  12/11/20 76   Wt Readings from Last 3 Encounters:  06/15/21 271 lb 6.4 oz (123.1 kg)  02/12/21 269 lb (122 kg)  08/18/20 273 lb (123.8 kg)    Assessment: Review of patient past medical history, allergies, medications, health status, including review of consultants reports, laboratory and other test data, was performed as part of comprehensive evaluation and provision of chronic care management services.   SDOH:  (Social Determinants of Health) assessments and interventions performed:    CCM Care Plan  Allergies  Allergen Reactions   Bactrim [Sulfamethoxazole-Trimethoprim] Itching   Gentamicin Other (See Comments)    Unknown    Medications Reviewed Today     Reviewed by Lavera Guise, [facility] (Pharmacist) on 06/24/21 at 70  Med List Status: <None>   Medication Order Taking? Sig Documenting Provider Last Dose Status Informant  buPROPion (WELLBUTRIN XL) 150 MG 24 hr tablet [id number] No Take 1 tablet (150 mg total) by mouth daily. Ronnie Doss M, DO Taking Active   cholecalciferol (VITAMIN D3) 25 MCG (1000 UNIT) tablet [id number] No Take 1,000 Units by mouth daily. [provider] Taking Active Self  insulin degludec (TRESIBA FLEXTOUCH) 200 UNIT/ML FlexTouch Pen [id number] No Inject 60 Units into the skin at bedtime.  Patient taking differently: Inject 60 Units into the skin daily.   Cassandria Anger, MD Taking Active            Med Note Blanca Friend, Royce Macadamia   Fri Mar 06, 2021  8:54 AM) Via [employer] patient assistance program   Discontinued 06/24/21 1025 (Patient Preference) lisinopril (ZESTRIL) 5 MG tablet [id number]  Take 1 tablet (5 mg total) by mouth daily. Ronnie Doss M, DO  Active   Multiple Vitamin (MULTIVITAMIN WITH MINERALS) TABS tablet [id number] No Take 1 tablet by mouth daily. Equate Multivitamin for Men 50+ [provider] Taking Active Self  NOVOLOG FLEXPEN 100 UNIT/ML FlexPen [id number] No Inject 10 Units into the skin 2 (two) times daily with breakfast and lunch AND 8-9 Units daily with supper. Janora Norlander, DO Taking Active            Med Note Blanca Friend, Royce Macadamia   Fri Mar 06, 2021  8:54 AM) Via novo nordisk patient assistance program  rosuvastatin (CRESTOR) 20 MG tablet [id number] No Take 1 tablet by mouth once daily Ronnie Doss M, DO Taking Active  Patient Active Problem List   Diagnosis Date Noted   Hepatic steatosis 12/15/2020   Nocturia 08/06/2020   Benign prostatic hyperplasia with urinary obstruction 08/06/2020   Weak urinary stream 08/06/2020   Elevated ALT measurement 06/16/2020   Mixed hyperlipidemia 05/22/2020   Essential hypertension, benign 05/22/2020   Cough 04/03/2020   Preventative health care 10/30/2019   Elevated LFTs 10/30/2019   Microalbuminuria due to type 2 diabetes mellitus ([place]) 03/27/2019   Diabetic ulcer of left midfoot associated with type 2 diabetes mellitus, with fat layer exposed ([place]) 10/10/2018   Influenza vaccine refused 05/29/2018   Chest discomfort 11/27/2016   DOE (dyspnea on exertion) 11/25/2016   Hx of right BKA ([place]) 03/05/2016   S/P unilateral BKA (below knee amputation), right ([place]) 12/12/2015   Healthcare maintenance 11/14/2015   Hyperlipidemia associated with type 2 diabetes mellitus ([place]) 11/14/2015   Diabetes mellitus ([place]) 08/11/2015   Depression 08/11/2015   Vitamin D deficiency 08/11/2015   Asthma 08/11/2015    There is no  immunization history for the selected administration types on file for this patient.  Conditions to be addressed/monitored: HLD and DMII  Care Plan : PHARMD MEDICATION MANAGEMENT  Updates made by Lavera Guise, RPH since 06/24/2021 12:00 AM     Problem: DISEASE PROGRESSION PREVENTION      Long-Range Goal: T2DM   Recent Progress: On track  Priority: High  Note:   Current Barriers:  Unable to independently afford treatment regimen Unable to maintain control of T2DM  Pharmacist Clinical Goal(s):  Over the next 90 days, patient will verbalize ability to afford treatment regimen maintain control of T2DM as evidenced by IMPROVED GLYCEMIC CONTROL  through collaboration with PharmD and provider.    Interventions: 1:1 collaboration with Janora Norlander, DO regarding development and update of comprehensive plan of care as evidenced by provider attestation and co-signature Inter-disciplinary care team collaboration (see longitudinal plan of care) Comprehensive medication review performed; medication list updated in electronic medical record  Diabetes: Controlled; current treatment: Tresiba 60units, novolog (sliding scale 8-10 units with meals; A1c 5.8%, GFR 73 Current glucose readings: fasting glucose: <130, post prandial glucose: <180 Patient is not interested in CGM at this time, he is content with traditional fingersticks Consider GLP1 in the place of novolog for CV benefit--patient does not want to change therapy at this time Content and stable Denies hypoglycemic/hyperglycemic symptoms Discussed meal planning options and Plate method for healthy eating Avoid sugary drinks and desserts Incorporate balanced protein, non starchy veggies, 1 serving of carbohydrate with each meal Increase water intake Increase physical activity as able Current exercise: n/a Cholesterol now at goal LDL<70-continue rosuvastatin Recommended consider GLP1 as above Assessed patient finances.  Re-Enrolled patient in the [employer] patient assistance program (tresiba & novolog)  Medication will continue to ship to PCP office   Patient Goals/Self-Care Activities Over the next 90 days, patient will:  - take medications as prescribed check glucose DAILY FASTING, document, and provide at future appointments collaborate with provider on medication access solutions  Follow Up Plan: Telephone follow up appointment with care management team member scheduled for: 3 months     Medication Assistance:  TRESIBA AND NOVOLOG obtained through [employer] medication assistance program.  Enrollment ends 2022--RE ENROLLMENT SUBMITTED TODAY  Patient's preferred pharmacy is:  [facility] [address] [place] [address] [place] [address] Phone: [phone] Fax: [phone]   Follow Up:  Patient agrees to Care Plan and Follow-up.  Plan: Telephone follow up appointment with care management team member scheduled for:  3 MONTHS  Regina Eck, PharmD, BCPS Clinical Pharmacist, [place]  [facility] Phone [phone]

## 2021-06-29 IMAGING — US US ABDOMEN LIMITED
1 series · 14 of 25 positions shown · non-contrast
Comparison: None.

CLINICAL DATA: Elevated liver enzymes.

EXAM:
ULTRASOUND ABDOMEN LIMITED RIGHT UPPER QUADRANT

[Series 1: us abdomen limited · 0.22mm/px · 14 of 80 slices shown]
[im 1/80]
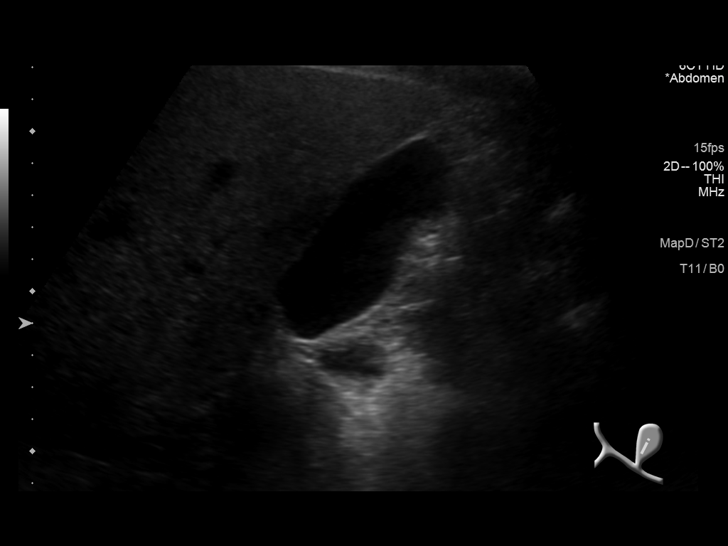
[im 7/80]
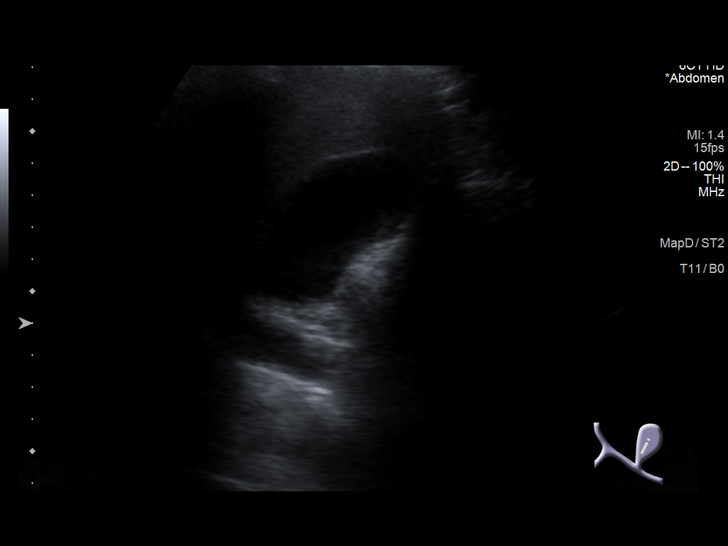
[im 14/80]
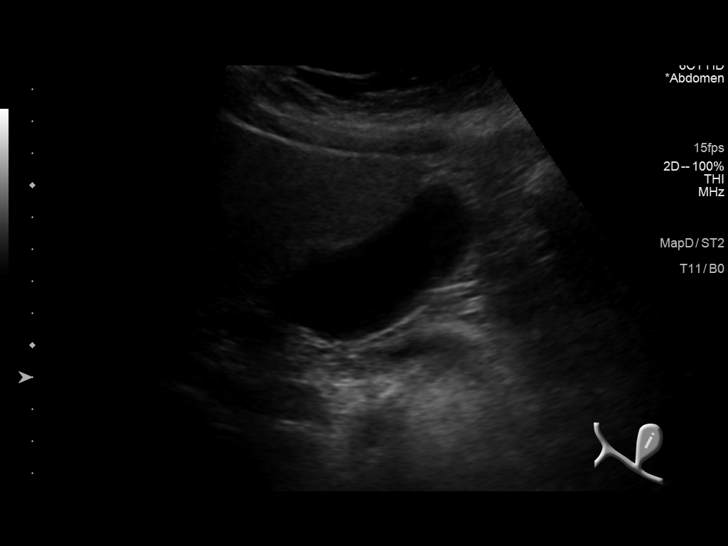
[im 20/80]
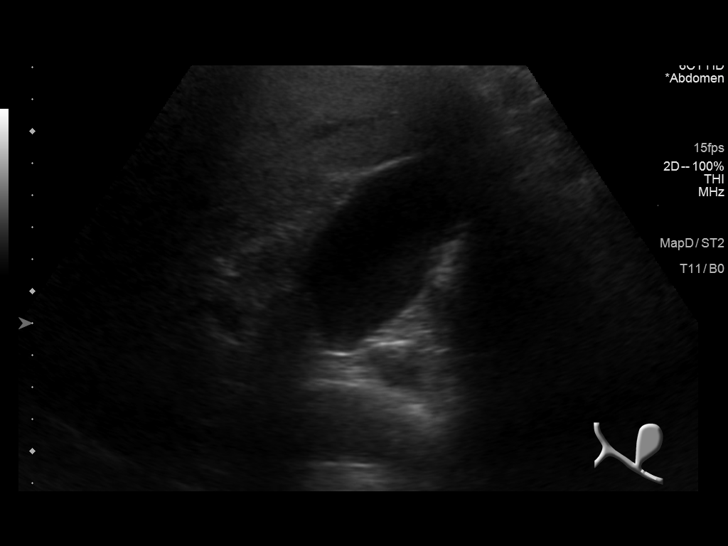
[im 27/80]
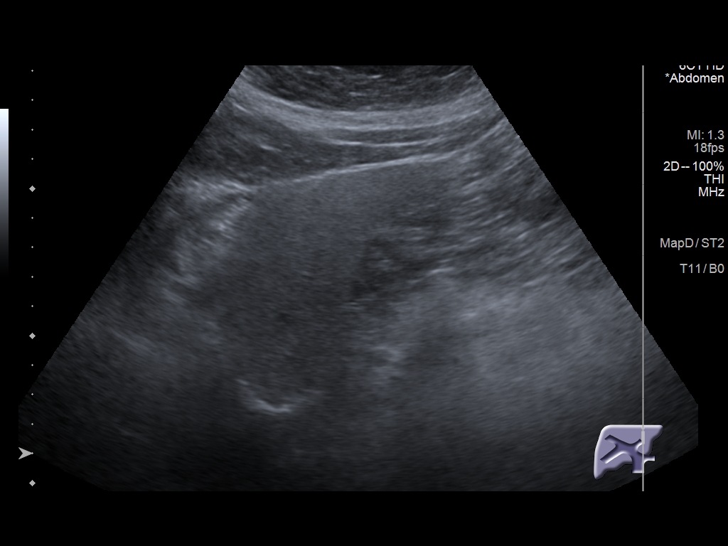
[im 30/80]
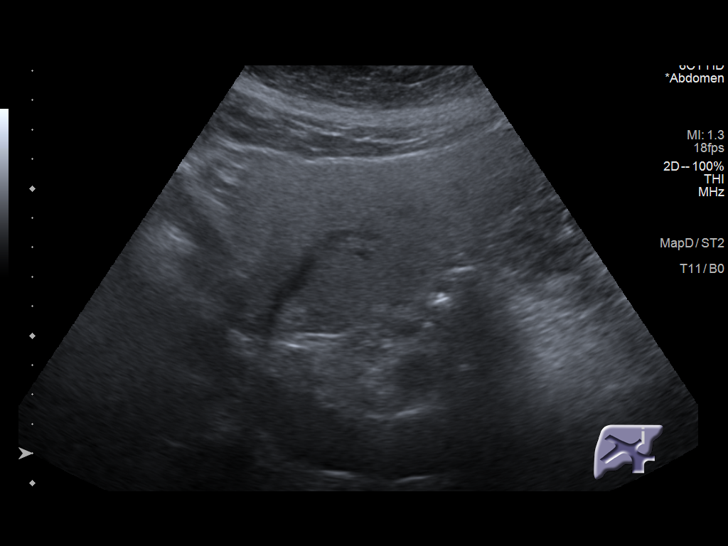
[im 37/80]
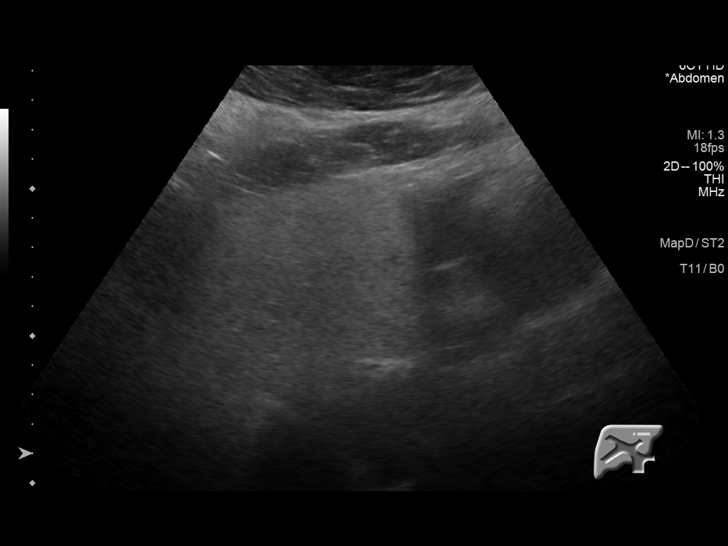
[im 43/80]
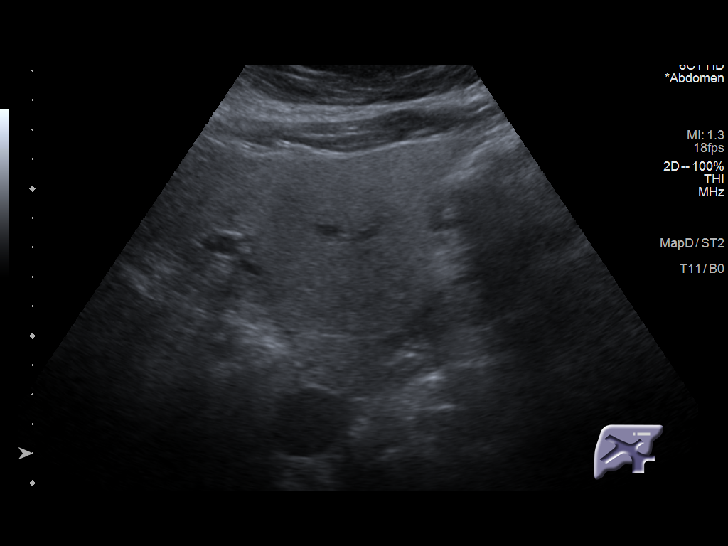
[im 50/80]
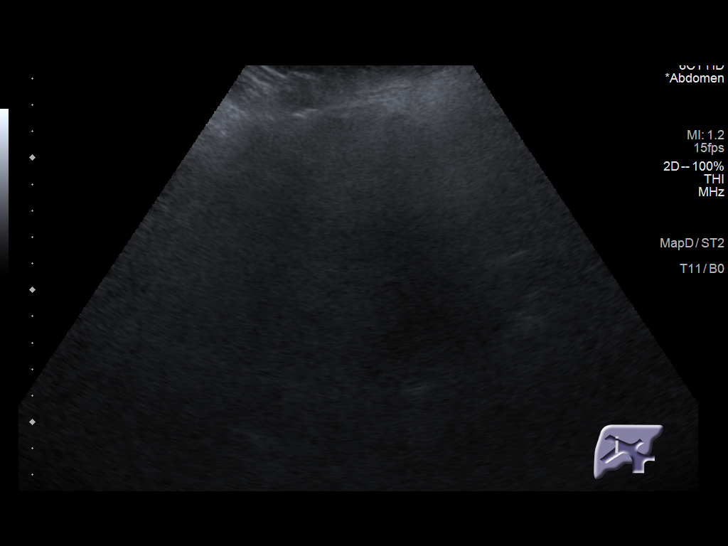
[im 53/80]
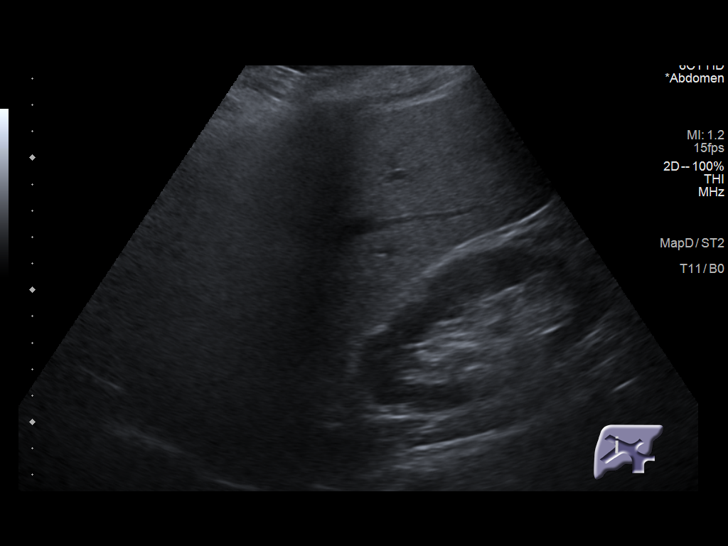
[im 60/80]
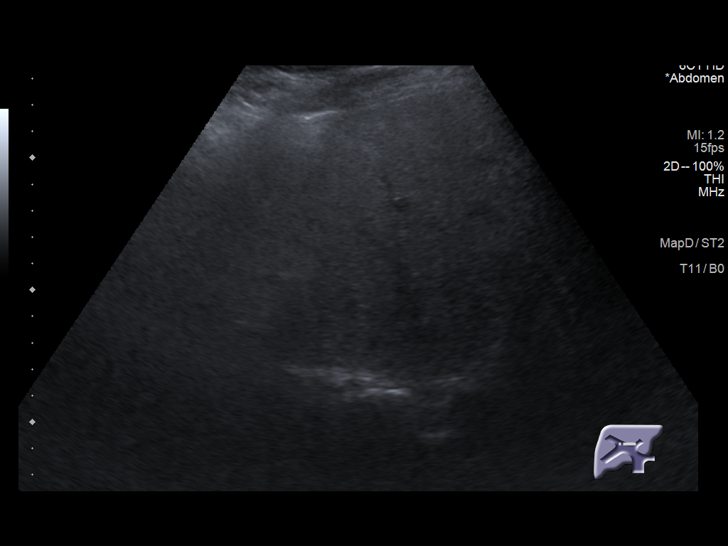
[im 66/80]
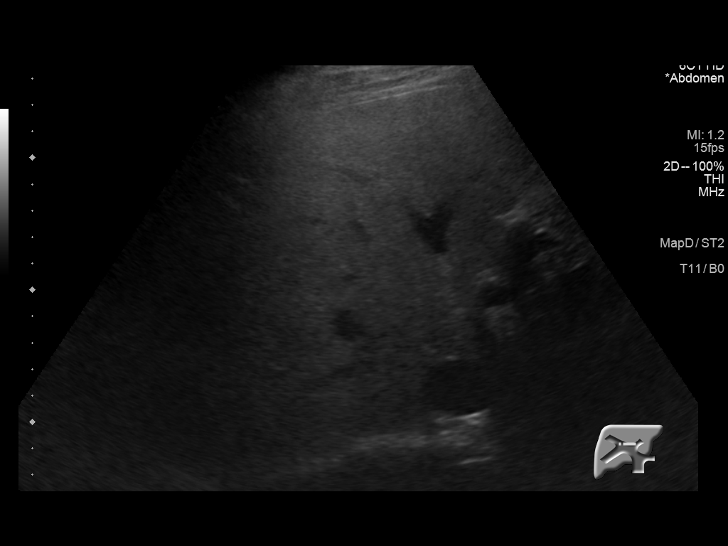
[im 73/80]
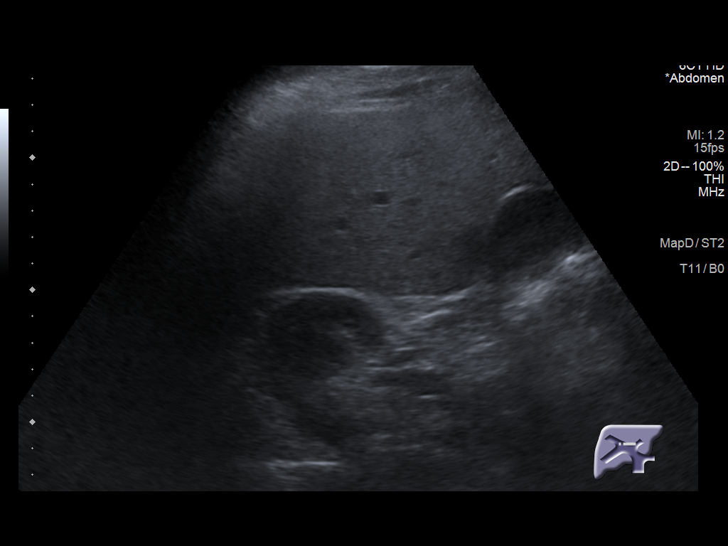
[im 80/80]
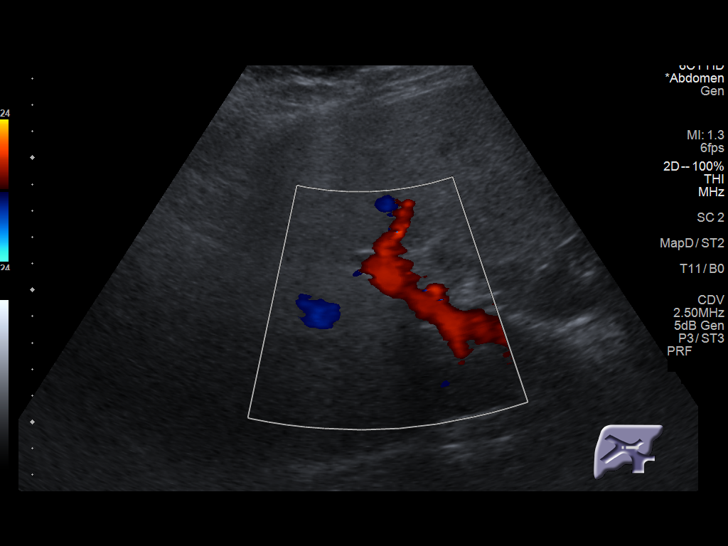

[14 of 25 positions shown; findings below may reference images not displayed]

FINDINGS: Gallbladder:

No gallstones or wall thickening visualized. No sonographic Murphy
sign noted by sonographer.

Common bile duct:

Diameter: 4 mm

Liver:

The liver appears enlarged and demonstrates coarse echotexture and
increased echogenicity, likely reflecting diffuse steatosis. No
overt cirrhotic contour abnormalities or focal lesions are
identified. There is no evidence of intrahepatic biliary ductal
dilatation. Portal vein is patent on color Doppler imaging with
normal direction of blood flow towards the liver.

Other: None.
IMPRESSION: Hepatomegaly and increased echogenicity of the liver parenchyma
likely reflecting diffuse steatosis.

## 2021-07-07 ENCOUNTER — Telehealth: Payer: Self-pay | Admitting: *Deleted

## 2021-07-07 ENCOUNTER — Other Ambulatory Visit: Payer: Self-pay

## 2021-07-07 DIAGNOSIS — R7401 Elevation of levels of liver transaminase levels: Secondary | ICD-10-CM

## 2021-07-07 NOTE — Telephone Encounter (Signed)
Novofine 32g tip needles - #2 boxes here from pt assistance. Pt called and aware to pick up - up front with name and dob on it

## 2021-07-15 ENCOUNTER — Telehealth: Payer: Self-pay

## 2021-07-15 NOTE — Telephone Encounter (Signed)
Attempted to contact pt on all numbers listed. No voicemail available. Will try again.

## 2021-07-22 ENCOUNTER — Ambulatory Visit (INDEPENDENT_AMBULATORY_CARE_PROVIDER_SITE_OTHER): Payer: Medicare HMO | Admitting: *Deleted

## 2021-07-22 DIAGNOSIS — Z0000 Encounter for general adult medical examination without abnormal findings: Secondary | ICD-10-CM | POA: Diagnosis not present

## 2021-07-22 NOTE — Progress Notes (Signed)
MEDICARE ANNUAL WELLNESS VISIT  07/22/2021  Telephone Visit Disclaimer This Medicare AWV was conducted by telephone due to national recommendations for restrictions regarding the COVID-19 Pandemic (e.g. social distancing).  I verified, using two identifiers, that I am speaking with Rick Lane or their authorized healthcare agent. I discussed the limitations, risks, security, and privacy concerns of performing an evaluation and management service by telephone and the potential availability of an in-person appointment in the future. The patient expressed understanding and agreed to proceed.  Location of Patient: home Location of Provider (nurse):  office  Subjective:    Rick Lane is a 70 y.o. male patient of Rick Norlander, DO who had a Medicare Annual Wellness Visit today via telephone. Rick Lane is Retired and lives with their spouse. he has 0 children. he reports that he is socially active and does interact with friends/family regularly. he is photo physically active and enjoys photography.  Patient Care Team: Rick Norlander, DO as PCP - General (Family Medicine) Rick Lane, Rick Estimable, MD as Consulting Physician (Gastroenterology) Rick Lane, Rick Lane (Pharmacist)  Advanced Directives 07/22/2021 12/19/2019 10/02/2018 09/25/2018 11/10/2016 05/20/2016 03/05/2016  Does Patient Have a Medical Advance Directive? No No No No No No No  Would patient like information on creating a medical advance directive? No - Patient declined No - Patient declined No - Patient declined No - Patient declined No - Patient declined No - patient declined information No - patient declined information    Lane Utilization Over the Past 12 Months: # of hospitalizations or ER visits: 0 # of surgeries: 0  Review of Systems    Patient reports that his overall health is unchanged compared to last year.  History obtained from chart review and the patient  Patient Reported Readings (BP, Pulse, CBG, Weight,  etc) CBG 139  Pain Assessment Pain : No/denies pain     Current Medications & Allergies (verified) Allergies as of 07/22/2021       Reactions   Bactrim [sulfamethoxazole-trimethoprim] Itching   Gentamicin Other (See Comments)   Unknown        Medication List        Accurate as of July 22, 2021  1:40 PM. If you have any questions, ask your nurse or doctor.          buPROPion 150 MG 24 hr tablet Commonly known as: Wellbutrin XL Take 1 tablet (150 mg total) by mouth daily.   cholecalciferol 25 MCG (1000 UNIT) tablet Commonly known as: VITAMIN D3 Take 1,000 Units by mouth daily.   lisinopril 5 MG tablet Commonly known as: ZESTRIL Take 1 tablet (5 mg total) by mouth daily.   multivitamin with minerals Tabs tablet Take 1 tablet by mouth daily. Equate Multivitamin for Men 50+   NovoLOG FlexPen 100 UNIT/ML FlexPen Generic drug: insulin aspart Inject 10 Units into the skin 2 (two) times daily with breakfast and lunch AND 8-9 Units daily with supper.   rosuvastatin 20 MG tablet Commonly known as: CRESTOR Take 1 tablet by mouth once daily   Tresiba FlexTouch 200 UNIT/ML FlexTouch Pen Generic drug: insulin degludec Inject 60 Units into the skin at bedtime. 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 ([place]) 11/02/2016   Depression    Diabetes mellitus without complication ([place])  Type II   Hyperlipidemia    Neuropathy    Neuropathy    Sleep apnea    Past Surgical History:  Procedure Laterality Date   AMPUTATION Right 12/12/2015   Procedure: Right Transmetatarsal Amputation With [facility] Placement;  Surgeon: [name], MD;  Location: [place];  Service: Orthopedics;  Laterality: Right;   AMPUTATION Right 03/05/2016   Procedure: RIGHT BELOW KNEE AMPUTATION;  Surgeon: [name], MD;  Location: [place];  Service: Orthopedics;  Laterality: Right;   CATARACT EXTRACTION W/PHACO Right 10/02/2018   Procedure: CATARACT EXTRACTION PHACO AND INTRAOCULAR LENS PLACEMENT (IOC);  Surgeon: Baruch Goldmann, MD;  Location: [facility];  Service: Ophthalmology;  Laterality: Right;  CDE: 130.53   COLONOSCOPY     COLONOSCOPY N/A 12/19/2019   Procedure: COLONOSCOPY;  Surgeon: Daneil Dolin, MD;  Diverticulosis in the sigmoid and descending colon, redundant colon, otherwise normal exam.    EYE SURGERY Left    Cataract   EYE SURGERY Right    r   ORBITAL RECONSTRUCTION Right    STUMP REVISION Right 11/10/2016   Procedure: REVISION RIGHT BELOW KNEE AMPUTATION;  Surgeon: [name], MD;  Location: [place];  Service: Orthopedics;  Laterality: Right;   toe removal Right 2012,2014   Family History  Problem Relation Age of Onset   Cirrhosis Mother        secondary to alcohol abuse.    Heart disease Father    Diabetes Sister    Diabetes Brother    Heart attack Paternal Grandfather    Colon cancer Maternal Grandfather        passed in the mid-60's.   Social History   Socioeconomic History   Marital status: Married    Spouse name: Not on file   Number of children: 0   Years of education: 14   Highest education level: Not on file  Occupational History   Occupation: Scientist, research (physical sciences)    Comment: [employer]   Occupation: retired  Tobacco Use   Smoking status: Never   Smokeless tobacco: Never  Scientific laboratory technician Use: Never used  Substance and Sexual Activity   Alcohol use: No   Drug use: No   Sexual activity: Not Currently    Birth control/protection: None  Other Topics Concern   Not on file  Social History Narrative   Not on file   Social Determinants of Health   Financial Resource Strain: Low Risk    Difficulty of Paying Living Expenses: Not hard at all  Food Insecurity: No Food Insecurity   Worried About Charity fundraiser in the Last Year: Never true   [place] in the Last Year: Never true   Transportation Needs: No Transportation Needs   Lack of Transportation (Medical): No   Lack of Transportation (Non-Medical): No  Physical Activity: Inactive   Days of Exercise per Week: 0 days   Minutes of Exercise per Session: 0 min  Stress: No Stress Concern Present   Feeling of Stress : Only a little  Social Connections: Moderately Integrated   Frequency of Communication with Friends and Family: More than three times a week   Frequency of Social Gatherings with Friends and Family: Once a week   Attends Religious Services: 1 to 4 times per year   Active Member of [employer] or Organizations: No   Attends Archivist Meetings: Never   Marital Status: Married    Activities of Daily Living In your present state  of health, do you have any difficulty performing the following activities: 07/22/2021 07/18/2021  Hearing? N N  Vision? N N  Difficulty concentrating or making decisions? N N  Walking or climbing stairs? N N  Dressing or bathing? N N  Doing errands, shopping? N N  Preparing Food and eating ? N N  Using the Toilet? N N  In the past six months, have you accidently leaked urine? N N  Do you have problems with loss of bowel control? N N  Managing your Medications? N N  Managing your Finances? N N  Housekeeping or managing your Housekeeping? - N  Some recent data might be hidden    Patient Education/ Literacy How often do you need to have someone help you when you read instructions, pamphlets, or other written materials from your doctor or pharmacy?: 1 - Never What is the last grade level you completed in school?: 14  Exercise Current Exercise Habits: The patient does not participate in regular exercise at present, Exercise limited by: Other - see comments (right leg BTK prothesis)  Diet Patient reports consuming 2 meals a day and 1 snack(s) a day Patient reports that his primary diet is: Diabetic Patient reports that she does have regular access to food.    Depression Screen PHQ 2/9 Scores 07/22/2021 06/15/2021 02/12/2021 08/18/2020 08/18/2020 06/18/2020 05/08/2020  PHQ - 2 Score 0 0 1 0 0 1 0  PHQ- 9 Score - - 4 0 0 2 -     Fall Risk Fall Risk  07/22/2021 07/18/2021 06/15/2021 02/12/2021 08/18/2020  Falls in the past year? 0 0 0 0 0  Number falls in past yr: - - - - -  Injury with Fall? - - - - -  Risk for fall due to : - - - - -  Follow up - - - - -     Objective:  Rick Lane seemed alert and oriented and he participated appropriately during our telephone visit.  Blood Pressure Weight BMI  BP Readings from Last 3 Encounters:  06/15/21 (!) 141/86  02/12/21 (!) 108/58  12/11/20 133/72   Wt Readings from Last 3 Encounters:  06/15/21 271 lb 6.4 oz (123.1 kg)  02/12/21 269 lb (122 kg)  08/18/20 273 lb (123.8 kg)   BMI Readings from Last 1 Encounters:  06/15/21 34.85 kg/m    *Unable to obtain current vital signs, weight, and BMI due to telephone visit type  Hearing/Vision  Shourya did not seem to have difficulty with hearing/understanding during the telephone conversation Reports that he has not had a formal eye exam by an eye care professional within the past year Reports that he has not had a formal hearing evaluation within the past year *Unable to fully assess hearing and vision during telephone visit type  Cognitive Function: 6CIT Screen 07/22/2021  What Year? 0 points  What month? 0 points  What time? 0 points  Count back from 20 0 points  Months in reverse 0 points  Repeat phrase 2 points  Total Score 2   (Normal:0-7, Significant for Dysfunction: >8)  Normal Cognitive Function Screening: Yes   Immunization & Health Maintenance Record There is no immunization history for the selected administration types on file for this patient.  Health Maintenance  Topic Date Due   COVID-19 Vaccine (1) Never done   OPHTHALMOLOGY EXAM  03/18/2021   Zoster Vaccines- Shingrix (1 of 2) 09/15/2021 (Originally 10/17/2001)    INFLUENZA VACCINE  10/23/2021 (Originally 02/23/2021)   TETANUS/TDAP  02/12/2022 (Originally 10/18/1970)   Pneumonia Vaccine 78+ Years old (1 - PCV) 06/15/2022 (Originally 10/17/1956)   HEMOGLOBIN A1C  12/13/2021   FOOT EXAM  06/15/2022   COLONOSCOPY (Pts 45-66yrs Insurance coverage will need to be confirmed)  12/18/2029   Hepatitis C Screening  Completed   HPV VACCINES  Aged Out       Assessment  This is a routine wellness examination for [employer].  Health Maintenance: Due or Overdue Health Maintenance Due  Topic Date Due   COVID-19 Vaccine (1) Never done   OPHTHALMOLOGY EXAM  03/18/2021    Rick Lane does not need a referral for Community Assistance: Care Management:   no Social Work:    no Prescription Assistance:  no Nutrition/Diabetes Education:  no   Plan:  Personalized Goals  Goals Addressed             This Visit's Progress    Increase physical activity         Personalized Health Maintenance & Screening Recommendations  Pneumococcal vaccine  Influenza vaccine Td vaccine Pt declines the above vaccines.  Lung Cancer Screening Recommended: no (Low Dose CT Chest recommended if Age 82-80 years, 30 pack-year currently smoking OR have quit w/in past 15 years) Hepatitis C Screening recommended: no HIV Screening recommended: no  Advanced Directives: Written information was not prepared per patient's request.  Referrals & Orders No orders of the defined types were placed in this encounter.   Follow-up Plan Follow-up with Rick Norlander, DO as planned on 10/14/21 Schedule diabetic eye exam asap, last one was 2020. Pt has a right prosthesis d/t BKA. He is independent with all ADL"s. Pt refuses covid, flu, pneumonia and tetanus vaccines at this time. Shingle vaccine was given outside of the office and updated in records. Pt declined information on advanced directives. Pt voices no healthcare concerns. AVS mailed to pt.    I have personally  reviewed and noted the following in the patients chart:   Medical and social history Use of alcohol, tobacco or illicit drugs  Current medications and supplements Functional ability and status Nutritional status Physical activity Advanced directives List of other physicians Hospitalizations, surgeries, and ER visits in previous 12 months Vitals Screenings to include cognitive, depression, and falls Referrals and appointments  In addition, I have reviewed and discussed with Rick Lane certain preventive protocols, quality metrics, and best practice recommendations. A written personalized care plan for preventive services as well as general preventive health recommendations is available and can be mailed to the patient at his request.      Rana Snare, LPN  [id number]

## 2021-07-31 ENCOUNTER — Other Ambulatory Visit: Payer: Self-pay | Admitting: Family Medicine

## 2021-07-31 DIAGNOSIS — F32A Depression, unspecified: Secondary | ICD-10-CM

## 2021-08-27 ENCOUNTER — Telehealth: Payer: Self-pay | Admitting: Family Medicine

## 2021-08-27 NOTE — Telephone Encounter (Signed)
Patient brought by tax forms to assist with completion of application for patient assistance. Wife stated that she never got them back and has not heard anything about the application. She would like for Almyra Free to call back and talk to her about the status of the application.

## 2021-08-31 NOTE — Telephone Encounter (Signed)
Attempted to call pt back regarding pt assistance. This is the 3rd attempt. One number has a childs voicemail, the other two have no voicemail at all.   Need most up to date household income info for novo nordisk application.

## 2021-08-31 NOTE — Telephone Encounter (Signed)
My direct line [phone]

## 2021-10-14 ENCOUNTER — Encounter: Payer: Self-pay | Admitting: Family Medicine

## 2021-10-14 ENCOUNTER — Ambulatory Visit (INDEPENDENT_AMBULATORY_CARE_PROVIDER_SITE_OTHER): Payer: Medicare HMO | Admitting: Family Medicine

## 2021-10-14 VITALS — BP 139/81 | HR 76 | Temp 98.0°F | Ht 74.0 in | Wt 267.2 lb

## 2021-10-14 DIAGNOSIS — R809 Proteinuria, unspecified: Secondary | ICD-10-CM | POA: Diagnosis not present

## 2021-10-14 DIAGNOSIS — E785 Hyperlipidemia, unspecified: Secondary | ICD-10-CM

## 2021-10-14 DIAGNOSIS — E1129 Type 2 diabetes mellitus with other diabetic kidney complication: Secondary | ICD-10-CM | POA: Diagnosis not present

## 2021-10-14 DIAGNOSIS — Z794 Long term (current) use of insulin: Secondary | ICD-10-CM | POA: Diagnosis not present

## 2021-10-14 DIAGNOSIS — E1169 Type 2 diabetes mellitus with other specified complication: Secondary | ICD-10-CM

## 2021-10-14 LAB — BAYER DCA HB A1C WAIVED: HB A1C (BAYER DCA - WAIVED): 5.8 % — ABNORMAL HIGH (ref 4.8–5.6)

## 2021-10-14 NOTE — Patient Instructions (Signed)
Get a new meter for sugar checks.  A1c still pretty tightly controlled.  Continue to monitor. No changes since no hypoglycemia. ?

## 2021-10-14 NOTE — Progress Notes (Signed)
? ?Subjective: ?CC:DM ?PCP: Janora Norlander, DO ?MSX:Rick Lane is a 71 y.o. male presenting to clinic today for: ? ?1. Type 2 Diabetes with hypertension, hyperlipidemia:  ?He is compliant with 40 units of Tresiba daily.  He has not required any NovoLog with meals.  He has really changed of his diet such that he is not eating a bunch of carbs anymore.  He has had a max blood sugar of 200 but typically blood sugars are running less than 150 with occasional outliers that seem to be getting better.  He uses a ReliOn meter and notes that this is quite old.  He wonders if perhaps he needs a calibrated meter or a new meter.  He had an episode of dizziness recently and had a nurse check his blood sugar.  It was 140 but he wonders if that was an erroneous blood sugar reading, as he had not eaten that day.  No measured hypoglycemic episodes ? ?Last eye exam: UTD ?Last foot exam: UTD ?Last A1c:  ?Lab Results  ?Component Value Date  ? HGBA1C 5.8 (H) 06/15/2021  ? ?Nephropathy screen indicated?:  On ACE inhibitor ?Last flu, zoster and/or pneumovax:  ?Immunization History  ?Administered Date(s) Administered  ? Zoster Recombinat (Shingrix) 01/31/2021, 04/15/2021  ? ? ?ROS: No chest pain shortness of breath or falls ? ? ?ROS: Per HPI ? ?Allergies  ?Allergen Reactions  ? Bactrim [Sulfamethoxazole-Trimethoprim] Itching  ? Gentamicin Other (See Comments)  ?  Unknown  ? ?Past Medical History:  ?Diagnosis Date  ? Asthma   ? ass a child  ? Cataract   ? 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) 11/02/2016  ? Depression   ? Diabetes mellitus without complication ([place])   ? Type II  ? Hyperlipidemia   ? Neuropathy   ? Neuropathy   ? Sleep apnea   ? ? ?Current Outpatient Medications:  ?  buPROPion (WELLBUTRIN XL) 150 MG 24 hr tablet, Take 1 tablet by mouth once daily, Disp: 90 tablet, Rfl: 0 ?  cholecalciferol (VITAMIN D3) 25 MCG (1000 UNIT) tablet, Take 1,000 Units by mouth daily., Disp: , Rfl:  ?  insulin  degludec (TRESIBA FLEXTOUCH) 200 UNIT/ML FlexTouch Pen, Inject 60 Units into the skin at bedtime. (Patient taking differently: Inject 60 Units into the skin daily.), Disp: 9 mL, Rfl: 2 ?  lisinopril (ZESTRIL) 5 MG tablet, Take 1 tablet (5 mg total) by mouth daily., Disp: 90 tablet, Rfl: 3 ?  Multiple Vitamin (MULTIVITAMIN WITH MINERALS) TABS tablet, Take 1 tablet by mouth daily. Equate Multivitamin for Men 50+, Disp: , Rfl:  ?  NOVOLOG FLEXPEN 100 UNIT/ML FlexPen, Inject 10 Units into the skin 2 (two) times daily with breakfast and lunch AND 8-9 Units daily with supper., Disp: 15 mL, Rfl: 12 ?  rosuvastatin (CRESTOR) 20 MG tablet, Take 1 tablet by mouth once daily, Disp: 90 tablet, Rfl: 0 ?Social History  ? ?Socioeconomic History  ? Marital status: Married  ?  Spouse name: Not on file  ? Number of children: 0  ? Years of education: 66  ? Highest education level: Not on file  ?Occupational History  ? Occupation: Scientist, research (physical sciences)  ?  Comment: [employer]  ? Occupation: retired  ?Tobacco Use  ? Smoking status: Never  ? Smokeless tobacco: Never  ?Vaping Use  ? Vaping Use: Never used  ?Substance and Sexual Activity  ? Alcohol use: No  ? Drug use: No  ? Sexual activity: Not Currently  ?  Birth  control/protection: None  ?Other Topics Concern  ? Not on file  ?Social History Narrative  ? Not on file  ? ?Social Determinants of Health  ? ?Financial Resource Strain: Low Risk   ? Difficulty of Paying Living Expenses: Not hard at all  ?Food Insecurity: No Food Insecurity  ? Worried About Charity fundraiser in the Last Year: Never true  ? Ran Out of Food in the Last Year: Never true  ?Transportation Needs: No Transportation Needs  ? Lack of Transportation (Medical): No  ? Lack of Transportation (Non-Medical): No  ?Physical Activity: Inactive  ? Days of Exercise per Week: 0 days  ? Minutes of Exercise per Session: 0 min  ?Stress: No Stress Concern Present  ? Feeling of Stress : Only a little  ?Social Connections: Moderately Integrated  ?  Frequency of Communication with Friends and Family: More than three times a week  ? Frequency of Social Gatherings with Friends and Family: Once a week  ? Attends Religious Services: 1 to 4 times per year  ? Active Member of Clubs or Organizations: No  ? Attends Archivist Meetings: Never  ? Marital Status: Married  ?Intimate Partner Violence: Not At Risk  ? Fear of Current or Ex-Partner: No  ? Emotionally Abused: No  ? Physically Abused: No  ? Sexually Abused: No  ? ?Family History  ?Problem Relation Age of Onset  ? Cirrhosis Mother   ?     secondary to alcohol abuse.   ? Heart disease Father   ? Diabetes Sister   ? Diabetes Brother   ? Heart attack Paternal Grandfather   ? Colon cancer Maternal Grandfather   ?     passed in the mid-60's.  ? ? ?Objective: ?Office vital signs reviewed. ?BP 139/81   Pulse 76   Temp 98 ?F (36.7 ?C)   Ht '6\' 2"'$  (1.88 m)   Wt 267 lb 3.2 oz (121.2 kg)   SpO2 94%   BMI 34.31 kg/m?  ? ?Physical Examination:  ?General: Awake, alert, well nourished, No acute distress ?HEENT: Sclera white.  Moist mucous membranes ?Cardio: regular rate and rhythm, S1S2 heard, no murmurs appreciated ?Pulm: clear to auscultation bilaterally, no wheezes, rhonchi or rales; normal work of breathing on room air ?Extremities: warm, well perfused, No edema, cyanosis or clubbing; +2 pulses bilaterally ?MSK: normal gait and station ? ?Assessment/ Plan: ?71 y.o. male  ? ?Type 2 diabetes mellitus with microalbuminuria, with long-term current use of insulin (HCC) - Plan: Bayer DCA Hb A1c Waived ? ?Hyperlipidemia associated with type 2 diabetes mellitus ([place]) ? ?Blood sugars appear to be within normal range.  I have recommended that he get a new meter because I do question erroneous readings given his A1c at 5.8 today.  May need to consider backing down on his insulin. ? ?Not due for fasting lipid.  No changes in medications. ? ?Orders Placed This Encounter  ?Procedures  ? Bayer DCA Hb A1c Waived  ? ?No  orders of the defined types were placed in this encounter. ? ? ? ?Janora Norlander, DO ?[place] ?[phone] ? ?

## 2021-10-28 ENCOUNTER — Other Ambulatory Visit: Payer: Self-pay | Admitting: Family Medicine

## 2021-10-28 DIAGNOSIS — F32A Depression, unspecified: Secondary | ICD-10-CM

## 2021-11-25 ENCOUNTER — Encounter: Payer: Self-pay | Admitting: Internal Medicine

## 2021-12-13 ENCOUNTER — Other Ambulatory Visit: Payer: Self-pay | Admitting: Family Medicine

## 2022-02-12 ENCOUNTER — Ambulatory Visit (INDEPENDENT_AMBULATORY_CARE_PROVIDER_SITE_OTHER): Payer: Medicare HMO | Admitting: Family Medicine

## 2022-02-12 ENCOUNTER — Encounter: Payer: Self-pay | Admitting: Family Medicine

## 2022-02-12 VITALS — BP 124/69 | HR 88 | Temp 97.1°F | Ht 74.0 in | Wt 262.4 lb

## 2022-02-12 DIAGNOSIS — Z0000 Encounter for general adult medical examination without abnormal findings: Secondary | ICD-10-CM

## 2022-02-12 DIAGNOSIS — H9193 Unspecified hearing loss, bilateral: Secondary | ICD-10-CM | POA: Diagnosis not present

## 2022-02-12 DIAGNOSIS — Z794 Long term (current) use of insulin: Secondary | ICD-10-CM | POA: Diagnosis not present

## 2022-02-12 DIAGNOSIS — I739 Peripheral vascular disease, unspecified: Secondary | ICD-10-CM | POA: Diagnosis not present

## 2022-02-12 DIAGNOSIS — E1129 Type 2 diabetes mellitus with other diabetic kidney complication: Secondary | ICD-10-CM

## 2022-02-12 DIAGNOSIS — Z0001 Encounter for general adult medical examination with abnormal findings: Secondary | ICD-10-CM

## 2022-02-12 DIAGNOSIS — R69 Illness, unspecified: Secondary | ICD-10-CM | POA: Diagnosis not present

## 2022-02-12 DIAGNOSIS — E785 Hyperlipidemia, unspecified: Secondary | ICD-10-CM

## 2022-02-12 DIAGNOSIS — R3912 Poor urinary stream: Secondary | ICD-10-CM | POA: Diagnosis not present

## 2022-02-12 DIAGNOSIS — E1169 Type 2 diabetes mellitus with other specified complication: Secondary | ICD-10-CM

## 2022-02-12 DIAGNOSIS — R809 Proteinuria, unspecified: Secondary | ICD-10-CM

## 2022-02-12 DIAGNOSIS — K760 Fatty (change of) liver, not elsewhere classified: Secondary | ICD-10-CM

## 2022-02-12 DIAGNOSIS — Z89511 Acquired absence of right leg below knee: Secondary | ICD-10-CM

## 2022-02-12 DIAGNOSIS — F32A Depression, unspecified: Secondary | ICD-10-CM

## 2022-02-12 LAB — BAYER DCA HB A1C WAIVED: HB A1C (BAYER DCA - WAIVED): 7.5 % — ABNORMAL HIGH (ref 4.8–5.6)

## 2022-02-12 NOTE — Progress Notes (Signed)
Rick Lane is a 71 y.o. male presents to office today for annual physical exam examination.    Concerns today include: 1. Type 2 Diabetes with hypertension, hyperlipidemia with history of microalbuminuria and BKA on right:  Compliant with 60 units of Tresiba daily, up to 24 units of NovoLog daily.  He is compliant with lisinopril 5 mg, Crestor 20 mg.  He no longer sees endocrinology.  Has history of right BKA.  Just had his prosthetic evaluated and lengthened.  No supplies needed at this time  Last eye exam: Up-to-date Last foot exam: Up-to-date Last A1c:  Lab Results  Component Value Date   HGBA1C 5.8 (H) 10/14/2021   Nephropathy screen indicated?:  On ACE inhibitor Last flu, zoster and/or pneumovax:  Immunization History  Administered Date(s) Administered   Zoster Recombinat (Shingrix) 01/31/2021, 04/15/2021    ROS: No chest pain, shortness of breath (outside of exertion).  Utilizes cane for ambulation as he has a prosthetic on the right lower extremity secondary to history of BKA  2.  Depressive disorder He reports controlled depressive disorder.  In fact he notes that he has had an improvement even further with drawing.  He has been incorporating this into his daily activities and it brings him joy.  He does report being compliant with the Wellbutrin today  3.  Decreased hearing Patient reports decreased hearing such that sometimes he will ask people to repeat themselves.  He admits to ringing in his ears.  He is never had his hearing assessed but will be willing to get this checked out  Occupation: retired but volunteers at the [employer], Marital status: Married, Substance use: None Diet: Fair, Exercise: No structured.  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 ([place]) 11/02/2016   Depression    Diabetes mellitus without complication (HCC)    Type II   Hyperlipidemia    Neuropathy    Neuropathy    Sleep apnea    Social History   Socioeconomic History   Marital status: Married    Spouse name: Not on file   Number of children: 0   Years of education: 14   Highest education level: Not on file  Occupational History   Occupation: Scientist, research (physical sciences)    Comment: Film/video editor   Occupation: retired  Tobacco Use   Smoking status: Never   Smokeless tobacco: Never  Scientific laboratory technician Use: Never used  Substance and Sexual Activity   Alcohol use: No   Drug use: No   Sexual activity: Not Currently    Birth control/protection: None  Other Topics Concern   Not on file  Social History Narrative   Not on file   Social Determinants of Health   Financial Resource Strain: Low Risk  (07/22/2021)   Overall Financial Resource Strain (CARDIA)    Difficulty of Paying Living Expenses: Not hard at all  Food Insecurity: No Food Insecurity (07/22/2021)   Hunger Vital Sign    Worried About Running Out of Food in the Last Year: Never true    Ran Out of Food in the Last Year: Never true  Transportation Needs: No  Transportation Needs (07/22/2021)   PRAPARE - Hydrologist (Medical): No    Lack of Transportation (Non-Medical): No  Physical Activity: Inactive (07/22/2021)   Exercise Vital Sign    Days of Exercise per Week: 0 days    Minutes of Exercise per Session: 0 min  Stress: No Stress Concern Present (07/22/2021)   [place]    Feeling of Stress : Only a little  Social Connections: Moderately Integrated (07/22/2021)   Social Connection and Isolation Panel [NHANES]     Frequency of Communication with Friends and Family: More than three times a week    Frequency of Social Gatherings with Friends and Family: Once a week    Attends Religious Services: 1 to 4 times per year    Active Member of [employer] or Organizations: No    Attends Archivist Meetings: Never    Marital Status: Married  Human resources officer Violence: Not At Risk (07/22/2021)   Humiliation, Afraid, Rape, and Kick questionnaire    Fear of Current or Ex-Partner: No    Emotionally Abused: No    Physically Abused: No    Sexually Abused: No   Past Surgical History:  Procedure Laterality Date   AMPUTATION Right 12/12/2015   Procedure: Right Transmetatarsal Amputation With [place] Placement;  Surgeon: [name], MD;  Location: [facility];  Service: Orthopedics;  Laterality: Right;   AMPUTATION Right 03/05/2016   Procedure: RIGHT BELOW KNEE AMPUTATION;  Surgeon: [name], MD;  Location: [facility];  Service: Orthopedics;  Laterality: Right;   CATARACT EXTRACTION W/PHACO Right 10/02/2018   Procedure: CATARACT EXTRACTION PHACO AND INTRAOCULAR LENS PLACEMENT (IOC);  Surgeon: Baruch Goldmann, MD;  Location: [facility];  Service: Ophthalmology;  Laterality: Right;  CDE: 130.53   COLONOSCOPY     COLONOSCOPY N/A 12/19/2019   Procedure: COLONOSCOPY;  Surgeon: Daneil Dolin, MD;  Diverticulosis in the sigmoid and descending colon, redundant colon, otherwise normal exam.    EYE SURGERY Left    Cataract   EYE SURGERY Right    r   ORBITAL RECONSTRUCTION Right    STUMP REVISION Right 11/10/2016   Procedure: REVISION RIGHT BELOW KNEE AMPUTATION;  Surgeon: [name], MD;  Location: [facility];  Service: Orthopedics;  Laterality: Right;   toe removal Right 2012,2014   Family History  Problem Relation Age of Onset   Cirrhosis Mother        secondary to alcohol abuse.    Heart disease Father    Diabetes Sister    Diabetes Brother    Heart attack Paternal Grandfather    Colon cancer Maternal Grandfather         passed in the mid-60's.    Current Outpatient Medications:    buPROPion (WELLBUTRIN XL) 150 MG 24 hr tablet, Take 1 tablet by mouth once daily, Disp: 90 tablet, Rfl: 0   cholecalciferol (VITAMIN D3) 25 MCG (1000 UNIT) tablet, Take 1,000 Units by mouth daily., Disp: , Rfl:    insulin degludec (TRESIBA FLEXTOUCH) 200 UNIT/ML FlexTouch Pen, Inject 60 Units into the skin at bedtime. (Patient taking differently: Inject 60 Units into the skin daily.), Disp: 9 mL, Rfl: 2   lisinopril (ZESTRIL) 5 MG tablet, Take 1 tablet (5 mg total) by mouth daily., Disp: 90 tablet, Rfl: 3   Multiple Vitamin (MULTIVITAMIN WITH MINERALS) TABS tablet, Take 1 tablet by mouth daily. Equate Multivitamin for Men 50+, Disp: , Rfl:  NOVOLOG FLEXPEN 100 UNIT/ML FlexPen, Inject 10 Units into the skin 2 (two) times daily with breakfast and lunch AND 8-9 Units daily with supper., Disp: 15 mL, Rfl: 12   rosuvastatin (CRESTOR) 20 MG tablet, Take 1 tablet by mouth once daily, Disp: 90 tablet, Rfl: 0  Allergies  Allergen Reactions   Bactrim [Sulfamethoxazole-Trimethoprim] Itching   Gentamicin Other (See Comments)    Unknown     ROS: Review of Systems Pertinent items noted in HPI and remainder of comprehensive ROS otherwise negative.    Physical exam BP 124/69   Pulse 88   Temp (!) 97.1 F (36.2 C)   Ht 6' 2"  (1.88 m)   Wt 262 lb 6.4 oz (119 kg)   SpO2 95%   BMI 33.69 kg/m  General appearance: alert, cooperative, appears stated age, and no distress Head: Normocephalic, without obvious abnormality, atraumatic Eyes: negative findings: lids and lashes normal and conjunctivae and sclerae normal Ears: normal TM's and external ear canals both ears Nose: Nares normal. Septum midline. Mucosa normal. No drainage or sinus tenderness. Throat: lips, mucosa, and tongue normal; teeth and gums normal Neck: no adenopathy, no carotid bruit, supple, symmetrical, trachea midline, and thyroid not enlarged, symmetric, no  tenderness/mass/nodules Back: symmetric, no curvature. ROM normal. No CVA tenderness. Lungs: clear to auscultation bilaterally Chest wall: no tenderness Heart: regular rate and rhythm, S1, S2 normal, no murmur, click, rub or gallop Abdomen: soft, non-tender; bowel sounds normal; no masses,  no organomegaly Extremities:  Right lower extremity surgically absent.  Left lower extremity without edema Pulses:  +1 pedal pulse on left lower extremity Skin: Skin color, texture, turgor normal. No rashes or lesions Lymph nodes: Cervical, supraclavicular, and axillary nodes normal. Neurologic: Grossly normal Psych: Mood stable, speech normal   Assessment/ Plan: [name] here for annual physical exam.   Annual physical exam  Type 2 diabetes mellitus with microalbuminuria, with long-term current use of insulin ([place]) - Plan: CMP14+EGFR, Lipid panel, Bayer DCA Hb A1c Waived, CMP14+EGFR, Lipid panel, Bayer DCA Hb A1c Waived  Hyperlipidemia associated with type 2 diabetes mellitus (HCC)  Hepatic steatosis  S/P unilateral BKA (below knee amputation), right (HCC)  Depressive disorder  Weak urinary stream - Plan: PSA  Decreased hearing of both ears - Plan: Ambulatory referral to Audiology  PAD (peripheral artery disease) ([place])  Sugars now uncontrolled A1c rising to 7.5.  Reinforced need for carbohydrate restriction.  I did not change any meds today.  We will continue current regimen.  Labs have been collected  Continue statin for cholesterol control  Reports compliance with Wellbutrin today and notes that depressive disorder is stable  PSA added to labs.  Otherwise asymptomatic from prostate standpoint  Referral to audiology for hearing testing  Counseled on healthy lifestyle choices, including diet (rich in fruits, vegetables and lean meats and low in salt and simple carbohydrates) and exercise (at least 30 minutes of moderate physical activity daily).  Patient to follow up in 66mfor  DM recheck  Brysten Reister M. GLajuana Ripple DO

## 2022-02-12 NOTE — Patient Instructions (Addendum)
Goal sugar A1c 7.0.  Today, 7.5.  Watch carb intake!  Preventive Care 10 Years and Older, Male Preventive care refers to lifestyle choices and visits with your health care provider that can promote health and wellness. Preventive care visits are also called wellness exams. What can I expect for my preventive care visit? Counseling During your preventive care visit, your health care provider may ask about your: Medical history, including: Past medical problems. Family medical history. History of falls. Current health, including: Emotional well-being. Home life and relationship well-being. Sexual activity. Memory and ability to understand (cognition). Lifestyle, including: Alcohol, nicotine or tobacco, and drug use. Access to firearms. Diet, exercise, and sleep habits. Work and work Statistician. Sunscreen use. Safety issues such as seatbelt and bike helmet use. Physical exam Your health care provider will check your: Height and weight. These may be used to calculate your BMI (body mass index). BMI is a measurement that tells if you are at a healthy weight. Waist circumference. This measures the distance around your waistline. This measurement also tells if you are at a healthy weight and may help predict your risk of certain diseases, such as type 2 diabetes and high blood pressure. Heart rate and blood pressure. Body temperature. Skin for abnormal spots. What immunizations do I need?  Vaccines are usually given at various ages, according to a schedule. Your health care provider will recommend vaccines for you based on your age, medical history, and lifestyle or other factors, such as travel or where you work. What tests do I need? Screening Your health care provider may recommend screening tests for certain conditions. This may include: Lipid and cholesterol levels. Diabetes screening. This is done by checking your blood sugar (glucose) after you have not eaten for a while  (fasting). Hepatitis C test. Hepatitis B test. HIV (human immunodeficiency virus) test. STI (sexually transmitted infection) testing, if you are at risk. Lung cancer screening. Colorectal cancer screening. Prostate cancer screening. Abdominal aortic aneurysm (AAA) screening. You may need this if you are a current or former smoker. Talk with your health care provider about your test results, treatment options, and if necessary, the need for more tests. Follow these instructions at home: Eating and drinking  Eat a diet that includes fresh fruits and vegetables, whole grains, lean protein, and low-fat dairy products. Limit your intake of foods with high amounts of sugar, saturated fats, and salt. Take vitamin and mineral supplements as recommended by your health care provider. Do not drink alcohol if your health care provider tells you not to drink. If you drink alcohol: Limit how much you have to 0-2 drinks a day. Know how much alcohol is in your drink. In the [place] one drink equals one 12 oz bottle of beer (355 mL), one 5 oz glass of wine (148 mL), or one 1 oz glass of hard liquor (44 mL). Lifestyle Brush your teeth every morning and night with fluoride toothpaste. Floss one time each day. Exercise for at least 30 minutes 5 or more days each week. Do not use any products that contain nicotine or tobacco. These products include cigarettes, chewing tobacco, and vaping devices, such as e-cigarettes. If you need help quitting, ask your health care provider. Do not use drugs. If you are sexually active, practice safe sex. Use a condom or other form of protection to prevent STIs. Take aspirin only as told by your health care provider. Make sure that you understand how much to take and what form to take. Work  with your health care provider to find out whether it is safe and beneficial for you to take aspirin daily. Ask your health care provider if you need to take a cholesterol-lowering medicine  (statin). Find healthy ways to manage stress, such as: Meditation, yoga, or listening to music. Journaling. Talking to a trusted person. Spending time with friends and family. Safety Always wear your seat belt while driving or riding in a vehicle. Do not drive: If you have been drinking alcohol. Do not ride with someone who has been drinking. When you are tired or distracted. While texting. If you have been using any mind-altering substances or drugs. Wear a helmet and other protective equipment during sports activities. If you have firearms in your house, make sure you follow all gun safety procedures. Minimize exposure to UV radiation to reduce your risk of skin cancer. What's next? Visit your health care provider once a year for an annual wellness visit. Ask your health care provider how often you should have your eyes and teeth checked. Stay up to date on all vaccines. This information is not intended to replace advice given to you by your health care provider. Make sure you discuss any questions you have with your health care provider. Document Revised: 01/07/2021 Document Reviewed: 01/07/2021 Elsevier Patient Education  [place].

## 2022-02-13 LAB — CMP14+EGFR
ALT: 46 IU/L — ABNORMAL HIGH (ref 0–44)
AST: 29 IU/L (ref 0–40)
Albumin/Globulin Ratio: 2 (ref 1.2–2.2)
Albumin: 4.8 g/dL (ref 3.9–4.9)
Alkaline Phosphatase: 41 IU/L — ABNORMAL LOW (ref 44–121)
BUN/Creatinine Ratio: 16 (ref 10–24)
BUN: 17 mg/dL (ref 8–27)
Bilirubin Total: 0.5 mg/dL (ref 0.0–1.2)
CO2: 23 mmol/L (ref 20–29)
Calcium: 10.2 mg/dL (ref 8.6–10.2)
Chloride: 102 mmol/L (ref 96–106)
Creatinine, Ser: 1.06 mg/dL (ref 0.76–1.27)
Globulin, Total: 2.4 g/dL (ref 1.5–4.5)
Glucose: 227 mg/dL — ABNORMAL HIGH (ref 70–99)
Potassium: 5.1 mmol/L (ref 3.5–5.2)
Sodium: 140 mmol/L (ref 134–144)
Total Protein: 7.2 g/dL (ref 6.0–8.5)
eGFR: 75 mL/min/{1.73_m2} (ref >59)

## 2022-02-13 LAB — LIPID PANEL
Chol/HDL Ratio: 6.8 ratio — ABNORMAL HIGH (ref 0.0–5.0)
Cholesterol, Total: 270 mg/dL — ABNORMAL HIGH (ref 100–199)
HDL: 40 mg/dL (ref >39)
LDL Chol Calc (NIH): 184 mg/dL — ABNORMAL HIGH (ref 0–99)
Triglycerides: 241 mg/dL — ABNORMAL HIGH (ref 0–149)
VLDL Cholesterol Cal: 46 mg/dL — ABNORMAL HIGH (ref 5–40)

## 2022-02-16 ENCOUNTER — Other Ambulatory Visit: Payer: Self-pay | Admitting: Family Medicine

## 2022-02-16 ENCOUNTER — Telehealth: Payer: Self-pay | Admitting: Pharmacist

## 2022-02-16 DIAGNOSIS — R809 Proteinuria, unspecified: Secondary | ICD-10-CM

## 2022-02-16 DIAGNOSIS — Z89511 Acquired absence of right leg below knee: Secondary | ICD-10-CM

## 2022-02-16 DIAGNOSIS — Z794 Long term (current) use of insulin: Secondary | ICD-10-CM

## 2022-02-16 DIAGNOSIS — E1129 Type 2 diabetes mellitus with other diabetic kidney complication: Secondary | ICD-10-CM

## 2022-02-16 DIAGNOSIS — K760 Fatty (change of) liver, not elsewhere classified: Secondary | ICD-10-CM

## 2022-02-16 LAB — SPECIMEN STATUS REPORT

## 2022-02-16 LAB — PSA: Prostate Specific Ag, Serum: 0.4 ng/mL (ref 0.0–4.0)

## 2022-02-16 NOTE — Telephone Encounter (Signed)
Novo nordisk patient assistance refills faxed to company  Fax [phone] For: tresiba/novolog/pen needles 4 month supply to arrive at PCP office in 10-14 business days

## 2022-02-24 ENCOUNTER — Telehealth: Payer: Self-pay

## 2022-02-24 NOTE — Chronic Care Management (AMB) (Signed)
  Chronic Care Management   Note  02/24/2022 Name: Kanden Carey MRN: [id number] DOB: 1951/02/05  Aldahir Litaker is a 71 y.o. year old male who is a primary care patient of Janora Norlander, DO. I reached out to [name] by phone today in response to a referral sent by Mr. Marvel Biven's PCP.  Mr. Detjen was given information about Chronic Care Management services today including:  CCM service includes personalized support from designated clinical staff supervised by his physician, including individualized plan of care and coordination with other care providers 24/7 contact phone numbers for assistance for urgent and routine care needs. Service will only be billed when office clinical staff spend 20 minutes or more in a month to coordinate care. Only one practitioner may furnish and bill the service in a calendar month. The patient may stop CCM services at any time (effective at the end of the month) by phone call to the office staff. The patient is responsible for co-pay (up to 20% after annual deductible is met) if co-pay is required by the individual health plan.   Patient agreed to services and verbal consent obtained.   Follow up plan: Telephone appointment with care management team member scheduled for:03/19/2022  Noreene Larsson, [place], [place] [address] Direct Dial: [phone] [email or web address]

## 2022-03-11 ENCOUNTER — Other Ambulatory Visit: Payer: Self-pay | Admitting: Family Medicine

## 2022-03-15 ENCOUNTER — Other Ambulatory Visit (HOSPITAL_COMMUNITY): Payer: Self-pay

## 2022-03-19 ENCOUNTER — Ambulatory Visit: Payer: Medicare HMO | Admitting: Pharmacist

## 2022-03-19 DIAGNOSIS — E119 Type 2 diabetes mellitus without complications: Secondary | ICD-10-CM

## 2022-03-24 NOTE — Progress Notes (Signed)
  Care Management   Follow Up Note   03/18/2022 Name: Rick Lane MRN: [id number] DOB: May 13, 1951   Referred by: Janora Norlander, DO Reason for referral : Chronic Care Management and Diabetes   Third unsuccessful telephone outreach was attempted today. The patient was referred to the case management team for assistance with care management and care coordination. The patient's primary care provider has been notified of our unsuccessful attempts to make or maintain contact with the patient. The care management team is pleased to engage with this patient at any time in the future should he/she be interested in assistance from the care management team.   Follow Up Plan: No further follow up required:      Regina Eck, PharmD, BCPS Clinical Pharmacist, [place]  II Phone [phone]

## 2022-05-17 ENCOUNTER — Other Ambulatory Visit: Payer: Self-pay

## 2022-05-17 ENCOUNTER — Ambulatory Visit (INDEPENDENT_AMBULATORY_CARE_PROVIDER_SITE_OTHER): Payer: Medicare HMO | Admitting: Family Medicine

## 2022-05-17 ENCOUNTER — Encounter: Payer: Self-pay | Admitting: Family Medicine

## 2022-05-17 VITALS — BP 125/69 | HR 87 | Temp 97.2°F | Ht 74.0 in | Wt 261.2 lb

## 2022-05-17 DIAGNOSIS — I739 Peripheral vascular disease, unspecified: Secondary | ICD-10-CM | POA: Diagnosis not present

## 2022-05-17 DIAGNOSIS — E785 Hyperlipidemia, unspecified: Secondary | ICD-10-CM | POA: Diagnosis not present

## 2022-05-17 DIAGNOSIS — E1169 Type 2 diabetes mellitus with other specified complication: Secondary | ICD-10-CM | POA: Diagnosis not present

## 2022-05-17 DIAGNOSIS — H9193 Unspecified hearing loss, bilateral: Secondary | ICD-10-CM

## 2022-05-17 DIAGNOSIS — Z89511 Acquired absence of right leg below knee: Secondary | ICD-10-CM

## 2022-05-17 DIAGNOSIS — R809 Proteinuria, unspecified: Secondary | ICD-10-CM | POA: Diagnosis not present

## 2022-05-17 DIAGNOSIS — E119 Type 2 diabetes mellitus without complications: Secondary | ICD-10-CM | POA: Diagnosis not present

## 2022-05-17 DIAGNOSIS — E1129 Type 2 diabetes mellitus with other diabetic kidney complication: Secondary | ICD-10-CM

## 2022-05-17 LAB — BAYER DCA HB A1C WAIVED: HB A1C (BAYER DCA - WAIVED): 9.5 % — ABNORMAL HIGH (ref 4.8–5.6)

## 2022-05-17 MED ORDER — TRESIBA FLEXTOUCH 200 UNIT/ML ~~LOC~~ SOPN: 60.0000 [IU] | PEN_INJECTOR | Freq: Every day | SUBCUTANEOUS | 3 refills | Status: DC

## 2022-05-17 MED ORDER — NOVOLOG FLEXPEN 100 UNIT/ML ~~LOC~~ SOPN: PEN_INJECTOR | SUBCUTANEOUS | 12 refills | Status: DC

## 2022-05-17 NOTE — Progress Notes (Signed)
Subjective: CC:DM PCP: Janora Norlander, DO KWI:[id number] Rick Lane is a 71 y.o. male presenting to clinic today for:  1. Type 2 Diabetes with hypertension, HLD w/ h/o right sided BKA and microalbuminuria:  At last visit patient noted to have an increased A1c of 7.5.  We did not make any changes to his meds today as he had been previously controlled with diet modification.  He actually reports me today that he has not been on insulin for the last 6 months because he has not "gotten a call from the patient assistance program to get it refilled".  When asked why he did not call it himself, he has no answer.  Blood sugars in the morning are typically running in the 180s but he does not measure after meals nor before bedtime.  He does not report any vision changes, polydipsia, polyuria, chest pain, shortness of breath or dizziness.  He continues to have hearing problems and did not get a call for the audiologist that we placed in July because he keeps his phone off regularly.  His wife accompanies him today and asked that we please get that referral call to her telephone.  He is compliant with the Crestor that we restarted last visit.  He had a little bit of milk in his coffee this morning but otherwise is fasting  Last eye exam: UTD Last foot exam: UTD Last A1c:  Lab Results  Component Value Date   HGBA1C 7.5 (H) 02/12/2022   Nephropathy screen indicated?: needs Last flu, zoster and/or pneumovax:  Immunization History  Administered Date(s) Administered   Zoster Recombinat (Shingrix) 01/31/2021, 04/15/2021     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 (Patient taking differently: Inject 60 Units into the skin daily.), Disp: 9 mL, Rfl: 2   lisinopril (ZESTRIL) 5 MG tablet, Take 1 tablet (5 mg total) by mouth daily., Disp: 90 tablet, Rfl: 3   Multiple Vitamin (MULTIVITAMIN WITH MINERALS) TABS tablet, Take 1 tablet by mouth daily. Equate Multivitamin for Men 50+, Disp: , Rfl:    NOVOLOG FLEXPEN 100 UNIT/ML FlexPen, Inject 10 Units into the skin 2 (two) times daily with breakfast and lunch AND 8-9 Units daily with supper., Disp: 15 mL, Rfl: 12   rosuvastatin (CRESTOR) 20 MG tablet, Take 1 tablet by mouth once daily, Disp: 90 tablet, Rfl: 0 Social History   Socioeconomic History   Marital status: Married    Spouse name: Not on file   Number of children: 0   Years of education: 14   Highest education level: Not on file  Occupational History   Occupation: Scientist, research (physical sciences)  Comment: [employer]   Occupation: retired  Tobacco Use   Smoking status: Never   Smokeless tobacco: Never  Vaping Use   Vaping Use: Never used  Substance and Sexual Activity   Alcohol use: No   Drug use: No   Sexual activity: Not Currently    Birth control/protection: None  Other Topics Concern   Not on file  Social History Narrative   Not on file   Social Determinants of Health   Financial Resource Strain: Low Risk  (07/22/2021)   Overall Financial Resource Strain (CARDIA)    Difficulty of Paying Living Expenses: Not hard at all  Food Insecurity: No Food Insecurity (07/22/2021)   Hunger Vital Sign    Worried About Running Out of Food in the Last  Year: Never true    [place] in the Last Year: Never true  Transportation Needs: No Transportation Needs (07/22/2021)   PRAPARE - Hydrologist (Medical): No    Lack of Transportation (Non-Medical): No  Physical Activity: Inactive (07/22/2021)   Exercise Vital Sign    Days of Exercise per Week: 0 days    Minutes of Exercise per Session: 0 min  Stress: No Stress Concern Present (07/22/2021)   [place]    Feeling of Stress : Only a little  Social Connections: Moderately Integrated (07/22/2021)   Social Connection and Isolation Panel [NHANES]    Frequency of Communication with Friends and Family: More than three times a week    Frequency of Social Gatherings with Friends and Family: Once a week    Attends Religious Services: 1 to 4 times per year    Active Member of [employer] or Organizations: No    Attends Archivist Meetings: Never    Marital Status: Married  Human resources officer Violence: Not At Risk (07/22/2021)   Humiliation, Afraid, Rape, and Kick questionnaire    Fear of Current or Ex-Partner: No    Emotionally Abused: No    Physically Abused: No    Sexually Abused: No   Family History  Problem Relation Age of Onset   Cirrhosis Mother        secondary to alcohol abuse.    Heart disease Father    Diabetes Sister    Diabetes Brother    Heart attack Paternal Grandfather    Colon cancer Maternal Grandfather        passed in the mid-60's.    Objective: Office vital signs reviewed. BP 125/69   Pulse 87   Temp (!) 97.2 F (36.2 C)   Ht '6\' 2"'$  (1.88 m)   Wt 261 lb 3.2 oz (118.5 kg)   SpO2 97%   BMI 33.54 kg/m   Physical Examination:  General: Awake, alert, well nourished, No acute distress HEENT: sclera white, MMM Cardio: regular rate and rhythm, S1S2 heard, no murmurs appreciated Pulm: clear to auscultation bilaterally, no wheezes, rhonchi or rales; normal work  of breathing on room air MSK: Right lower extremity is surgically absent.  Has prosthesis in place.  Utilizing cane for ambulation.  Assessment/ Plan: 71 y.o. male   Type 2 diabetes mellitus with microalbuminuria, without long-term current use of insulin (HCC) - Plan: Bayer DCA Hb A1c Waived, Microalbumin / creatinine urine ratio, NOVOLOG FLEXPEN 100 UNIT/ML FlexPen, insulin degludec (TRESIBA FLEXTOUCH) 200 UNIT/ML FlexTouch Pen  Hyperlipidemia associated with type 2 diabetes mellitus ([place]) - Plan: Lipid panel, Hepatic function panel  PAD (peripheral artery disease) (  [place])  S/P unilateral BKA (below knee amputation), right ([place])  Decreased hearing of both ears - Plan: Ambulatory referral to Audiology  Blood sugars grossly uncontrolled with A1c rising to 9.5 today.  I is largely due to noncompliance with lifestyle and insulin.  He was given a sample of the [place] today and I renewed both of his [place] Aas and NovoLog to his pharmacy encouraged him to start using this again.  Monitor blood sugars closely, including postprandial.  Urine microalbumin collected as well as nonfasting lipid and liver function test.  New referral to audiology placed with appropriate telephone number to contact.  Follow-up in 3 months, sooner if concerns arise    Orders Placed This Encounter  Procedures   Bayer DCA Hb A1c Waived   Microalbumin / creatinine urine ratio   Lipid panel    Standing Status:   Future    Standing Expiration Date:   05/18/2023   Hepatic function panel    Standing Status:   Future    Standing Expiration Date:   05/18/2023   Ambulatory referral to Audiology    Referral Priority:   Routine    Referral Type:   Audiology Exam    Referral Reason:   Specialty Services Required    Number of Visits Requested:   1   No orders of the defined types were placed in this encounter.    Janora Norlander, DO [place] [phone]

## 2022-05-18 LAB — HEPATIC FUNCTION PANEL
ALT: 44 IU/L (ref 0–44)
AST: 27 IU/L (ref 0–40)
Albumin: 4.5 g/dL (ref 3.9–4.9)
Alkaline Phosphatase: 44 IU/L (ref 44–121)
Bilirubin Total: 0.5 mg/dL (ref 0.0–1.2)
Bilirubin, Direct: 0.15 mg/dL (ref 0.00–0.40)
Total Protein: 6.5 g/dL (ref 6.0–8.5)

## 2022-05-18 LAB — LIPID PANEL
Chol/HDL Ratio: 6.4 ratio — ABNORMAL HIGH (ref 0.0–5.0)
Cholesterol, Total: 236 mg/dL — ABNORMAL HIGH (ref 100–199)
HDL: 37 mg/dL — ABNORMAL LOW (ref >39)
LDL Chol Calc (NIH): 156 mg/dL — ABNORMAL HIGH (ref 0–99)
Triglycerides: 234 mg/dL — ABNORMAL HIGH (ref 0–149)
VLDL Cholesterol Cal: 43 mg/dL — ABNORMAL HIGH (ref 5–40)

## 2022-05-19 ENCOUNTER — Other Ambulatory Visit: Payer: Self-pay | Admitting: Family Medicine

## 2022-05-19 DIAGNOSIS — I739 Peripheral vascular disease, unspecified: Secondary | ICD-10-CM

## 2022-05-19 DIAGNOSIS — E785 Hyperlipidemia, unspecified: Secondary | ICD-10-CM

## 2022-05-19 DIAGNOSIS — Z89511 Acquired absence of right leg below knee: Secondary | ICD-10-CM

## 2022-05-19 DIAGNOSIS — E1129 Type 2 diabetes mellitus with other diabetic kidney complication: Secondary | ICD-10-CM

## 2022-05-19 DIAGNOSIS — K760 Fatty (change of) liver, not elsewhere classified: Secondary | ICD-10-CM

## 2022-05-19 LAB — MICROALBUMIN / CREATININE URINE RATIO
Creatinine, Urine: 92.4 mg/dL
Microalb/Creat Ratio: 37 mg/g creat — ABNORMAL HIGH (ref 0–29)
Microalbumin, Urine: 33.9 ug/mL

## 2022-05-19 NOTE — Progress Notes (Signed)
Orders Placed This Encounter  Procedures   AMB Referral to Chronic Care Management Services    Referral Priority:   Routine    Referral Type:   Consultation    Referral Reason:   Care Coordination    Number of Visits Requested:   1

## 2022-05-26 ENCOUNTER — Telehealth: Payer: Self-pay

## 2022-05-26 NOTE — Chronic Care Management (AMB) (Signed)
  Chronic Care Management   Note  05/26/2022 Name: Rick Lane MRN: [id number] DOB: [birth year]  Rick Lane is a 71 y.o. year old male who is a primary care patient of Janora Norlander, DO. I reached out to Rick Lane by phone today in response to a referral sent by Rick Lane's PCP.  Mr. People was given information about Chronic Care Management services today including:  CCM service includes personalized support from designated clinical staff supervised by his physician, including individualized plan of care and coordination with other care providers 24/7 contact phone numbers for assistance for urgent and routine care needs. Service will only be billed when office clinical staff spend 20 minutes or more in a month to coordinate care. Only one practitioner may furnish and bill the service in a calendar month. The patient may stop CCM services at any time (effective at the end of the month) by phone call to the office staff. The patient is responsible for co-pay (up to 20% after annual deductible is met) if co-pay is required by the individual health plan.   Patient agreed to services and verbal consent obtained.   Follow up plan: Telephone appointment with care management team member scheduled for: [facility] 06/01/2022 Pharm D 07/02/2022  Noreene Larsson, [place], [place] [address] Direct Dial: [phone] [email or web address]

## 2022-06-01 ENCOUNTER — Encounter: Payer: Self-pay | Admitting: Family Medicine

## 2022-06-01 ENCOUNTER — Ambulatory Visit (INDEPENDENT_AMBULATORY_CARE_PROVIDER_SITE_OTHER): Payer: Medicare HMO | Admitting: *Deleted

## 2022-06-01 DIAGNOSIS — N182 Chronic kidney disease, stage 2 (mild): Secondary | ICD-10-CM | POA: Insufficient documentation

## 2022-06-01 DIAGNOSIS — E1129 Type 2 diabetes mellitus with other diabetic kidney complication: Secondary | ICD-10-CM

## 2022-06-01 NOTE — Patient Instructions (Signed)
Please call the care guide team at [phone] if you need to cancel or reschedule your appointment.   If you are experiencing a Mental Health or [place] or need someone to talk to, please call the Suicide and Crisis Lifeline: 988 call the [place] National Suicide Prevention Lifeline: 303-220-0350 or TTY: (867) 400-2162 TTY (531)573-9369) to talk to a trained counselor call 1-800-273-TALK (toll free, 24 hour hotline) go to [facility] [address], [place] [phone]) call the [place]: [phone] call 911   Following is a copy of your full provider care plan:   Goals Addressed             This Visit's Progress    CCM ([place]) EXPECTED OUTCOME: MONITOR, SELF-MANAGE AND REDUCE SYMPTOMS OF CHRONIC KIDNEY DISEASE       Current Barriers:  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 Chronic Disease Management support and education needs related to CKD stage 2 No Advanced Directives in place- pt declines information Patient reports he does not check blood pressure and not interested in doing, patient states he does not drink sugary soft drinks, tries to eat healthy as much as he can, does not exercise  Planned Interventions: Assessed the patient     understanding of chronic kidney disease    Evaluation of current treatment plan related to chronic kidney disease self management and patient's adherence to plan as established by provider      Reviewed prescribed diet low sodium, heart healthy Counseled on the importance of exercise goals with target of 150 minutes per week     Advised patient, providing education and rationale, to monitor blood pressure daily and record, calling PCP for findings outside established parameters    Discussed plans with patient for ongoing care management follow up and provided patient with direct contact information for care management team    Screening for signs and  symptoms of depression related to chronic disease state      Assessed social determinant of health barriers    Education prescribed via My Chart- CKD  Symptom Management: Take medications as prescribed   Attend all scheduled provider appointments Call pharmacy for medication refills 3-7 days in advance of running out of medications Attend church or other social activities Perform all self care activities independently  Perform IADL's (shopping, preparing meals, housekeeping, managing finances) independently Call provider office for new concerns or questions  Drink adequate fluids- preferably water Try to get outside and walk daily  Follow Up Plan: Telephone follow up appointment with care management team member scheduled for:  07/07/22 at 1045 am       CCM (DI[place]ET[place]) EXPECTED OUTCOME:  MONITOR, SELF-MANAGE AND REDUCE SYMPTOMS OF DIABETES       Current Barriers:  Knowledge Deficits related to management of Diabetes Chronic Disease Management support and education needs related to Diabetes, diet, exercise Non-adherence to prescribed medication regimen No Advanced Directives in place- pt declined  Patient reports he lives with spouse, is independent with all aspects of his care, continues to drive, does not exercise. Patient reports he has not been taking lisinopril because " I don't like taking pills"  Patient states he will start taking lisinopril again. Patient reports he checks CBG QID with fasting ranges 100-130 with reading of 132 today, random ranges in 100's range and all under 150. Patient reports AIC up in October 2023 due to had not been taking medication for diabetes because he ran out, pt is  now working with [facility] pharmacist.  Planned Interventions: Provided education to patient about basic DM disease process; Reviewed medications with patient and discussed importance of medication adherence;        Reviewed prescribed diet with patient carbohydrate modified ; Discussed plans  with patient for ongoing care management follow up and provided patient with direct contact information for care management team;      Provided patient with written educational materials related to hypo and hyperglycemia and importance of correct treatment;       Review of patient status, including review of consultants reports, relevant laboratory and other test results, and medications completed;       Screening for signs and symptoms of depression related to chronic disease state;        Assessed social determinant of health barriers;        In basket message sent to Lottie Dawson pharmacist reporting pt has not been taking lisinopril and states he will resume  Symptom Management: Take medications as prescribed   Attend all scheduled provider appointments Call pharmacy for medication refills 3-7 days in advance of running out of medications Attend church or other social activities Perform all self care activities independently  Perform IADL's (shopping, preparing meals, housekeeping, managing finances) independently Call provider office for new concerns or questions  check blood sugar at prescribed times: 4 times daily check feet daily for cuts, sores or redness enter blood sugar readings and medication or insulin into daily log take the blood sugar log to all doctor visits take the blood sugar meter to all doctor visits trim toenails straight across fill half of plate with vegetables limit fast food meals to no more than 1 per week read food labels for fat, fiber, carbohydrates and portion size Look over education in My Chart- hypoglycemia Please start taking lisinopril, do not stop taking medications unless you talk with your doctor first  Follow Up Plan: Telephone follow up appointment with care management team member scheduled for: 07/07/22 at 1045 am          Patient verbalizes understanding of instructions and care plan provided today and agrees to view in [place]. Active  MyChart status and patient understanding of how to access instructions and care plan via MyChart confirmed with patient.     Telephone follow up appointment with care management team member scheduled for: 07/07/22 at 40 am  Chronic Kidney Disease, Adult Chronic kidney disease (CKD) occurs when the kidneys are slowly and permanently damaged over a long period of time. The kidneys are a pair of organs that do many important jobs in the body, including: Removing waste and extra fluid from the blood to make urine. Making hormones that maintain the amount of fluid in tissues and blood vessels. Maintaining the right amount of fluids and chemicals in the body. A small amount of kidney damage may not cause problems, but a large amount of damage may make it hard or impossible for the kidneys to work right. Steps must be taken to slow kidney damage or to stop it from getting worse. If steps are not taken, the kidneys may stop working permanently (end-stage renal disease, or ESRD). Most of the time, CKD does not go away, but it can often be controlled. People who have CKD are usually able to live full lives. What are the causes? The most common causes of this condition are diabetes and high blood pressure (hypertension). Other causes include: Cardiovascular diseases. These affect the heart and blood vessels. Kidney  diseases. These include: Glomerulonephritis, or inflammation of the tiny filters in the kidneys. Interstitial nephritis. This is swelling of the small tubes of the kidneys and of the surrounding structures. Polycystic kidney disease, in which clusters of fluid-filled sacs form within the kidneys. Renal vascular disease. This includes disorders that affect the arteries and veins of the kidneys. Diseases that affect the body's defense system (immune system). A problem with urine flow. This may be caused by: Kidney stones. Cancer. An enlarged prostate, in males. A kidney infection or urinary  tract infection (UTI) that keeps coming back. Vasculitis. This is swelling or inflammation of the blood vessels. What increases the risk? Your chances of having kidney disease increase with age. The following factors may make you more likely to develop this condition: A family history of kidney disease or kidney failure. Kidney failure means the kidneys can no longer work right. Certain genetic diseases. Taking medicines often that are damaging to the kidneys. Being around or being in contact with toxic substances. Obesity. A history of tobacco use. What are the signs or symptoms? Symptoms of this condition include: Feeling very tired (lethargic) and having less energy. Swelling, or edema, of the face, legs, ankles, or feet. Nausea or vomiting, or loss of appetite. Confusion or trouble concentrating. Muscle twitches and cramps, especially in the legs. Dry, itchy skin. A metallic taste in the mouth. Producing less urine, or producing more urine (especially at night). Shortness of breath. Trouble sleeping. CKD may also result in not having enough red blood cells or hemoglobin in the blood (anemia) or having weak bones (bone disease). Symptoms develop slowly and may not be obvious until the kidney damage becomes severe. It is possible to have kidney disease for years without having symptoms. How is this diagnosed? This condition may be diagnosed based on: Blood tests. Urine tests. Imaging tests, such as an ultrasound or a CT scan. A kidney biopsy. This involves removing a sample of kidney tissue to be looked at under a microscope. Results from these tests will help to determine how serious the CKD is. How is this treated? There is no cure for most cases of this condition, but treatment usually relieves symptoms and prevents or slows the worsening of the disease. Treatment may include: Diet changes, which may require you to avoid alcohol and foods that are high in salt, potassium,  phosphorous, and protein. Medicines. These may: Lower blood pressure. Control blood sugar (glucose). Relieve anemia. Relieve swelling. Protect your bones. Improve the balance of salts and minerals in your blood (electrolytes). Dialysis, which is a type of treatment that removes toxic waste from the body. It may be needed if you have kidney failure. Managing any other conditions that are causing your CKD or making it worse. Follow these instructions at home: Medicines Take over-the-counter and prescription medicines only as told by your health care provider. The amount of some medicines that you take may need to be changed. Do not take any new medicines unless approved by your health care provider. Many medicines can make kidney damage worse. Do not take any vitamin and mineral supplements unless approved by your health care provider. Many nutritional supplements can make kidney damage worse. Lifestyle  Do not use any products that contain nicotine or tobacco, such as cigarettes, e-cigarettes, and chewing tobacco. If you need help quitting, ask your health care provider. If you drink alcohol: Limit how much you use to: 0-1 drink a day for women who are not pregnant. 0-2 drinks a day  for men. Know how much alcohol is in your drink. In the [place] one drink equals one 12 oz bottle of beer (355 mL), one 5 oz glass of wine (148 mL), or one 1 oz glass of hard liquor (44 mL). Maintain a healthy weight. If you need help, ask your health care provider. General instructions  Follow instructions from your health care provider about eating or drinking restrictions, including any prescribed diet. Track your blood pressure at home. Report changes in your blood pressure as told. If you are being treated for diabetes, track your blood glucose levels as told. Start or continue an exercise plan. Exercise at least 30 minutes a day, 5 days a week. Keep your immunizations up to date as told. Keep all  follow-up visits. This is important. Where to find more information American Association of Kidney Patients: BombTimer.gl National Kidney Foundation: www.kidney.[place]: https://mathis.com/ Life Options: www.lifeoptions.org Kidney School: www.kidneyschool.org Contact a health care provider if: Your symptoms get worse. You develop new symptoms. Get help right away if: You develop symptoms of ESRD. These include: Headaches. Numbness in your hands or feet. Easy bruising. Frequent hiccups. Chest pain. Shortness of breath. Lack of menstrual periods, in women. You have a fever. You are producing less urine than usual. You have pain or bleeding when you urinate or when you have a bowel movement. These symptoms may represent a serious problem that is an emergency. Do not wait to see if the symptoms will go away. Get medical help right away. Call your local emergency services (911 in the [place] Do not drive yourself to the hospital. Summary Chronic kidney disease (CKD) occurs when the kidneys become damaged slowly over a long period of time. The most common causes of this condition are diabetes and high blood pressure (hypertension). There is no cure for most cases of CKD, but treatment usually relieves symptoms and prevents or slows the worsening of the disease. Treatment may include a combination of lifestyle changes, medicines, and dialysis. This information is not intended to replace advice given to you by your health care provider. Make sure you discuss any questions you have with your health care provider. Document Revised: 10/17/2019 Document Reviewed: 10/17/2019 Elsevier Patient Education  [place]. Hypoglycemia Hypoglycemia occurs when the level of sugar (glucose) in the blood is too low. Hypoglycemia can happen in people who have or do not have diabetes. It can develop quickly, and it can be a medical emergency. For most people, a blood glucose level below 70  mg/dL (3.9 mmol/L) is considered hypoglycemia. Glucose is a type of sugar that provides the body's main source of energy. Certain hormones (insulin and glucagon) control the level of glucose in the blood. Insulin lowers blood glucose, and glucagon raises blood glucose. Hypoglycemia can result from having too much insulin in the bloodstream, or from not eating enough food that contains glucose. You may also have reactive hypoglycemia, which happens within 4 hours after eating a meal. What are the causes? Hypoglycemia occurs most often in people who have diabetes and may be caused by: Diabetes medicine. Not eating enough, or not eating often enough. Increased physical activity. Drinking alcohol on an empty stomach. If you do not have diabetes, hypoglycemia may be caused by: A tumor in the pancreas. Not eating enough, or not eating for long periods at a time (fasting). A severe infection or illness. Problems after having bariatric surgery. Organ failure, such as kidney or liver failure. Certain medicines. What increases the risk?  Hypoglycemia is more likely to develop in people who: Have diabetes and take medicines to lower blood glucose. Abuse alcohol. Have a severe illness. What are the signs or symptoms? Symptoms vary depending on whether the condition is mild, moderate, or severe. Mild hypoglycemia Hunger. Sweating and feeling clammy. Dizziness or feeling light-headed. Sleepiness or restless sleep. Nausea. Increased heart rate. Headache. Blurry vision. Mood changes, such as irritability or anxiety. Tingling or numbness around the mouth, lips, or tongue. Moderate hypoglycemia Confusion and poor judgment. Behavior changes. Weakness. Irregular heartbeat. A change in coordination. Severe hypoglycemia Severe hypoglycemia is a medical emergency. It can cause: Fainting. Seizures. Loss of consciousness (coma). Death. How is this diagnosed? Hypoglycemia is diagnosed with a blood  test to measure your blood glucose level. This blood test is done while you are having symptoms. Your health care provider may also do a physical exam and review your medical history. How is this treated? This condition can be treated by immediately eating or drinking something that contains sugar with 15 grams of fast-acting carbohydrate, such as: 4 oz (120 mL) of fruit juice. 4 oz (120 mL) of regular soda (not diet soda). Several pieces of hard candy. Check food labels to find out how many pieces to eat for 15 grams. 1 Tbsp (15 mL) of sugar or honey. 4 glucose tablets. 1 tube of glucose gel. Treating hypoglycemia if you have diabetes If you are alert and able to swallow safely, follow the 15:15 rule: Take 15 grams of a fast-acting carbohydrate. Talk with your health care provider about how much you should take. Options for getting 15 grams of fast-acting carbohydrate include: Glucose tablets (take 4 tablets). Several pieces of hard candy. Check food labels to find out how many pieces to eat for 15 grams. 4 oz (120 mL) of fruit juice. 4 oz (120 mL) of regular soda (not diet soda). 1 Tbsp (15 mL) of sugar or honey. 1 tube of glucose gel. Check your blood glucose 15 minutes after you take the carbohydrate. If the repeat blood glucose level is still at or below 70 mg/dL (3.9 mmol/L), take 15 grams of a carbohydrate again. If your blood glucose level does not increase above 70 mg/dL (3.9 mmol/L) after 3 tries, seek emergency medical care. After your blood glucose level returns to normal, eat a meal or a snack within 1 hour.  Treating severe hypoglycemia Severe hypoglycemia is when your blood glucose level is below 54 mg/dL (3 mmol/L). Severe hypoglycemia is a medical emergency. Get medical help right away. If you have severe hypoglycemia and you cannot eat or drink, you will need to be given glucagon. A family member or close friend should learn how to check your blood glucose and how to give  you glucagon. Ask your health care provider if you need to have an emergency glucagon kit available. Severe hypoglycemia may need to be treated in a hospital. The treatment may include getting glucose through an IV. You may also need treatment for the cause of your hypoglycemia. Follow these instructions at home:  General instructions Take over-the-counter and prescription medicines only as told by your health care provider. Monitor your blood glucose as told by your health care provider. If you drink alcohol: Limit how much you have to: 0-1 drink a day for women who are not pregnant. 0-2 drinks a day for men. Know how much alcohol is in your drink. In the [place] one drink equals one 12 oz bottle of beer (355 mL), one 5  oz glass of wine (148 mL), or one 1 oz glass of hard liquor (44 mL). Be sure to eat food along with drinking alcohol. Be aware that alcohol is absorbed quickly and may have lingering effects that may result in hypoglycemia later. Be sure to do ongoing glucose monitoring. Keep all follow-up visits. This is important. If you have diabetes: Always have a fast-acting carbohydrate (15 grams) option with you to treat low blood glucose. Follow your diabetes management plan as directed by your health care provider. Make sure you: Know the symptoms of hypoglycemia. It is important to treat it right away to prevent it from becoming severe. Check your blood glucose as often as told. Always check before and after exercise. Always check your blood glucose before you drive a motorized vehicle. Take your medicines as told. Follow your meal plan. Eat on time, and do not skip meals. Share your diabetes management plan with people in your workplace, school, and household. Carry a medical alert card or wear medical alert jewelry. Where to find more information American Diabetes Association: www.diabetes.org Contact a health care provider if: You have problems keeping your blood glucose in  your target range. You have frequent episodes of hypoglycemia. Get help right away if: You continue to have hypoglycemia symptoms after eating or drinking something that contains 15 grams of fast-acting carbohydrate, and you cannot get your blood glucose above 70 mg/dL (3.9 mmol/L) while following the 15:15 rule. Your blood glucose is below 54 mg/dL (3 mmol/L). You have a seizure. You faint. These symptoms may represent a serious problem that is an emergency. Do not wait to see if the symptoms will go away. Get medical help right away. Call your local emergency services (911 in the [place] Do not drive yourself to the hospital. Summary Hypoglycemia occurs when the level of sugar (glucose) in the blood is too low. Hypoglycemia can happen in people who have or do not have diabetes. It can develop quickly, and it can be a medical emergency. Make sure you know the symptoms of hypoglycemia and how to treat it. Always have a fast-acting carbohydrate option with you to treat low blood sugar. This information is not intended to replace advice given to you by your health care provider. Make sure you discuss any questions you have with your health care provider. Document Revised: 06/12/2020 Document Reviewed: 06/12/2020 Elsevier Patient Education  [place].

## 2022-06-01 NOTE — Chronic Care Management (AMB) (Signed)
Chronic Care Management Provider Comprehensive Care Plan    06/01/2022 Name: Rick Lane MRN: [id number] DOB: 11-Aug-1950  Referral to Chronic Care Management (CCM) services was placed by Provider:  Ronnie Doss on Date: 05/19/22.  Chronic Condition 1: Diabetes Provider Assessment and Plan  Type 2 diabetes mellitus with microalbuminuria, without long-term current use of insulin (HCC) - Plan: Bayer DCA Hb A1c Waived, Microalbumin / creatinine urine ratio, NOVOLOG FLEXPEN 100 UNIT/ML FlexPen, insulin degludec (TRESIBA FLEXTOUCH) 200 UNIT/ML FlexTouch Pen   Expected Outcome/Goals Addressed This Visit (Provider CCM goals/Provider Assessment and plan  CCM (DIABETES) EXPECTED OUTCOME:  MONITOR, SELF-MANAGE AND REDUCE SYMPTOMS OF DIABETES  Symptom Management Condition 1: Take medications as prescribed   Attend all scheduled provider appointments Call pharmacy for medication refills 3-7 days in advance of running out of medications Attend church or other social activities Perform all self care activities independently  Perform IADL's (shopping, preparing meals, housekeeping, managing finances) independently Call provider office for new concerns or questions  check blood sugar at prescribed times: 4 times daily check feet daily for cuts, sores or redness enter blood sugar readings and medication or insulin into daily log take the blood sugar log to all doctor visits take the blood sugar meter to all doctor visits trim toenails straight across fill half of plate with vegetables limit fast food meals to no more than 1 per week read food labels for fat, fiber, carbohydrates and portion size Look over education in My Chart- hypoglycemia Please start taking lisinopril, do not stop taking medications unless you talk with your doctor first  Chronic Condition 2: CKD stage 2/ Kidney Failure Provider Assessment and Plan Type 2 diabetes mellitus with microalbuminuria, with long-term current use of  insulin ([place]) - Plan: CMP14+EGFR, Lipid panel, Bayer DCA Hb A1c Waived, CMP14+EGFR, Lipid panel, Bayer DCA Hb A1c Waived    Expected Outcome/Goals Addressed This Visit (Provider CCM goals/Provider Assessment and plan  CCM (CHRONIC KIDNEY DISEASE) EXPECTED OUTCOME: MONITOR, SELF-MANAGE AND REDUCE SYMPTOMS OF CHRONIC KIDNEY DISEASE  Symptom Management Condition 2: Take medications as prescribed   Attend all scheduled provider appointments Call pharmacy for medication refills 3-7 days in advance of running out of medications Attend church or other social activities Perform all self care activities independently  Perform IADL's (shopping, preparing meals, housekeeping, managing finances) independently Call provider office for new concerns or questions  Drink adequate fluids- preferably water Try to get outside and walk daily  Problem List Patient Active Problem List   Diagnosis Date Noted   CKD (chronic kidney disease) stage 2, GFR 60-89 ml/min 06/01/2022   PAD (peripheral artery disease) ([place]) 02/12/2022   Hepatic steatosis 12/15/2020   Benign prostatic hyperplasia with urinary obstruction 08/06/2020   Microalbuminuria due to type 2 diabetes mellitus ([place]) 03/27/2019   Diabetic ulcer of left midfoot associated with type 2 diabetes mellitus, with fat layer exposed ([place]) 10/10/2018   Influenza vaccine refused 05/29/2018   DOE (dyspnea on exertion) 11/25/2016   S/P unilateral BKA (below knee amputation), right ([place]) 12/12/2015   Hyperlipidemia associated with type 2 diabetes mellitus ([place]) 11/14/2015   Depression 08/11/2015   Vitamin D deficiency 08/11/2015   Asthma 08/11/2015    Medication Management  Current Outpatient Medications:    buPROPion (WELLBUTRIN XL) 150 MG 24 hr tablet, Take 1 tablet by mouth once daily, Disp: 90 tablet, Rfl: 0   cholecalciferol (VITAMIN D3) 25 MCG (1000 UNIT) tablet, Take 1,000 Units by mouth daily., Disp: , Rfl:    insulin  degludec (TRESIBA FLEXTOUCH)  200 UNIT/ML FlexTouch Pen, Inject 60 Units into the skin at bedtime., Disp: 9 mL, Rfl: 3   Multiple Vitamin (MULTIVITAMIN WITH MINERALS) TABS tablet, Take 1 tablet by mouth daily. Equate Multivitamin for Men 50+, Disp: , Rfl:    NOVOLOG FLEXPEN 100 UNIT/ML FlexPen, Inject 10 Units into the skin 2 (two) times daily with breakfast and lunch AND 8-9 Units daily with supper., Disp: 15 mL, Rfl: 12   rosuvastatin (CRESTOR) 20 MG tablet, Take 1 tablet by mouth once daily, Disp: 90 tablet, Rfl: 0   lisinopril (ZESTRIL) 5 MG tablet, Take 1 tablet (5 mg total) by mouth daily. (Patient not taking: Reported on 06/01/2022), Disp: 90 tablet, Rfl: 3  Cognitive Assessment Identity Confirmed: : Name; DOB Cognitive Status: Normal   Functional Assessment Hearing Difficulty or Deaf: no Wear Glasses or Blind: yes Vision Management: can see well w/ glasses Concentrating, Remembering or Making Decisions Difficulty (CP): no Difficulty Communicating: no Difficulty Eating/Swallowing: no Walking or Climbing Stairs Difficulty: no Dressing/Bathing Difficulty: no Doing Errands Independently Difficulty (such as shopping) (CP): no   Caregiver Assessment  Primary Source of Support/Comfort: spouse Name of Support/Comfort Primary Source: spouse People in Home: spouse Name(s) of People in Home: spouse Christopherjame Carnell   Planned Interventions  Provided education to patient about basic DM disease process; Reviewed medications with patient and discussed importance of medication adherence;        Reviewed prescribed diet with patient carbohydrate modified ; Discussed plans with patient for ongoing care management follow up and provided patient with direct contact information for care management team;      Provided patient with written educational materials related to hypo and hyperglycemia and importance of correct treatment;       Review of patient status, including review of consultants reports, relevant laboratory and other  test results, and medications completed;       Screening for signs and symptoms of depression related to chronic disease state;        Assessed social determinant of health barriers;    In basket message sent to Lottie Dawson pharmacist reporting pt has not been taking lisinopril and states he will resume     Assessed the patient     understanding of chronic kidney disease    Evaluation of current treatment plan related to chronic kidney disease self management and patient's adherence to plan as established by provider      Reviewed prescribed diet low sodium, heart healthy Counseled on the importance of exercise goals with target of 150 minutes per week     Advised patient, providing education and rationale, to monitor blood pressure daily and record, calling PCP for findings outside established parameters    Discussed plans with patient for ongoing care management follow up and provided patient with direct contact information for care management team    Screening for signs and symptoms of depression related to chronic disease state      Assessed social determinant of health barriers    Education prescribed via My Chart- CKD  Interaction and coordination with outside resources, practitioners, and providers See CCM Referral  Care Plan: Available in MyChart

## 2022-06-01 NOTE — Chronic Care Management (AMB) (Signed)
Chronic Care Management   CCM RN Visit Note  06/01/2022 Name: Rick Lane MRN: [id number] DOB: [birth year]  Subjective: Rick Lane is a 71 y.o. year old male who is a primary care patient of Janora Norlander, DO. The patient was referred to the Chronic Care Management team for assistance with care management needs subsequent to provider initiation of CCM services and plan of care.    Today's Visit:  Engaged with patient by telephone for follow up visit.     SDOH Interventions Today    Flowsheet Row Most Recent Value  SDOH Interventions   Food Insecurity Interventions Intervention Not Indicated  Housing Interventions Intervention Not Indicated  Transportation Interventions Intervention Not Indicated  Utilities Interventions Intervention Not Indicated  Financial Strain Interventions Intervention Not Indicated  Physical Activity Interventions Intervention Not Indicated  Stress Interventions Intervention Not Indicated  Social Connections Interventions Intervention Not Indicated         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 Chronic Disease Management support and education needs related to CKD stage 2 No Advanced Directives in place- pt declines information Patient reports he does not check blood pressure and not interested in doing, patient states he does not drink sugary soft drinks, tries to eat healthy as much as he can, does not exercise  Planned Interventions: Assessed the patient     understanding of chronic kidney disease    Evaluation of current treatment plan related to chronic kidney disease self management and patient's adherence to plan as established by provider      Reviewed prescribed diet low sodium, heart healthy Counseled on the importance of exercise goals  with target of 150 minutes per week     Advised patient, providing education and rationale, to monitor blood pressure daily and record, calling PCP for findings outside established parameters    Discussed plans with patient for ongoing care management follow up and provided patient with direct contact information for care management team    Screening for signs and symptoms of depression related to chronic disease state      Assessed social determinant of health barriers    Education prescribed via My Chart- CKD  Symptom Management: Take medications as prescribed   Attend all scheduled provider appointments Call pharmacy for medication refills 3-7 days in advance of running out of medications Attend church or other social activities Perform all self care activities independently  Perform IADL's (shopping, preparing meals, housekeeping, managing finances) independently Call provider office for new concerns or questions  Drink adequate fluids- preferably water Try to get outside and walk daily  Follow Up Plan: Telephone follow up appointment with care management team member scheduled for:  07/07/22 at 1045 am       CCM (DIABETES) EXPECTED OUTCOME:  MONITOR, SELF-MANAGE AND REDUCE SYMPTOMS OF DIABETES       Current Barriers:  Knowledge Deficits related to management of Diabetes Chronic Disease Management support and education needs related to Diabetes, diet, exercise Non-adherence to prescribed medication regimen No Advanced Directives in place- pt declined  Patient reports he lives with spouse, is independent with all aspects of his care, continues to drive, does not exercise. Patient reports he has not been taking lisinopril because " I don't like taking pills"  Patient states he will start taking lisinopril again. Patient reports he checks CBG QID with fasting ranges 100-130 with reading of 132 today, random ranges in 100's range and all under 150. Patient reports [place] up in October 2023  due to had not been taking medication for diabetes because he ran out, pt is now working with [facility] pharmacist.  Planned Interventions: Provided education to patient about basic DM disease process; Reviewed medications with patient and discussed importance of medication adherence;        Reviewed prescribed diet with patient carbohydrate modified ; Discussed plans with patient for ongoing care management follow up and provided patient with direct contact information for care management team;      Provided patient with written educational materials related to hypo and hyperglycemia and importance of correct treatment;       Review of patient status, including review of consultants reports, relevant laboratory and other test results, and medications completed;       Screening for signs and symptoms of depression related to chronic disease state;        Assessed social determinant of health barriers;        In basket message sent to Lottie Dawson pharmacist reporting pt has not been taking lisinopril and states he will resume  Symptom Management: Take medications as prescribed   Attend all scheduled provider appointments Call pharmacy for medication refills 3-7 days in advance of running out of medications Attend church or other social activities Perform all self care activities independently  Perform IADL's (shopping, preparing meals, housekeeping, managing finances) independently Call provider office for new concerns or questions  check blood sugar at prescribed times: 4 times daily check feet daily for cuts, sores or redness enter blood sugar readings and medication or insulin into daily log take the blood sugar log to all doctor visits take the blood sugar meter to all doctor visits trim toenails straight across fill half of plate with vegetables limit fast food meals to no more than 1 per week read food labels for fat, fiber, carbohydrates and portion size Look over education in My  Chart- hypoglycemia Please start taking lisinopril, do not stop taking medications unless you talk with your doctor first  Follow Up Plan: Telephone follow up appointment with care management team member scheduled for: 07/07/22 at 1045 am          Plan:Telephone follow up appointment with care management team member scheduled for:  07/07/22 at [place] am  Jacqlyn Larsen [facility], BSN RN Case Manager [place] [phone]

## 2022-06-05 ENCOUNTER — Other Ambulatory Visit: Payer: Self-pay | Admitting: Family Medicine

## 2022-06-05 DIAGNOSIS — E1129 Type 2 diabetes mellitus with other diabetic kidney complication: Secondary | ICD-10-CM

## 2022-06-24 DIAGNOSIS — E1122 Type 2 diabetes mellitus with diabetic chronic kidney disease: Secondary | ICD-10-CM | POA: Diagnosis not present

## 2022-06-24 DIAGNOSIS — N182 Chronic kidney disease, stage 2 (mild): Secondary | ICD-10-CM | POA: Diagnosis not present

## 2022-07-02 ENCOUNTER — Telehealth: Payer: Self-pay | Admitting: Pharmacist

## 2022-07-02 ENCOUNTER — Ambulatory Visit: Payer: Medicare HMO | Admitting: Pharmacist

## 2022-07-02 DIAGNOSIS — N182 Chronic kidney disease, stage 2 (mild): Secondary | ICD-10-CM

## 2022-07-02 DIAGNOSIS — R809 Proteinuria, unspecified: Secondary | ICD-10-CM

## 2022-07-02 NOTE — Telephone Encounter (Signed)
Does not wish to try/start kerendia Reinforced the Importance of an ace inhibitor Patient says he will restart  We can f/u in 07/2022  Pcp appt on 08/20/22

## 2022-07-07 ENCOUNTER — Ambulatory Visit (INDEPENDENT_AMBULATORY_CARE_PROVIDER_SITE_OTHER): Payer: Medicare HMO | Admitting: *Deleted

## 2022-07-07 DIAGNOSIS — N182 Chronic kidney disease, stage 2 (mild): Secondary | ICD-10-CM

## 2022-07-07 DIAGNOSIS — E1129 Type 2 diabetes mellitus with other diabetic kidney complication: Secondary | ICD-10-CM

## 2022-07-07 NOTE — Patient Instructions (Signed)
Please call the care guide team at [phone] if you need to cancel or reschedule your appointment.   If you are experiencing a Mental Health or [place] or need someone to talk to, please call the Suicide and Crisis Lifeline: 988 call the [place] National Suicide Prevention Lifeline: (917)385-3519 or TTY: 301-295-2552 TTY (208)585-1617) to talk to a trained counselor call 1-800-273-TALK (toll free, 24 hour hotline) go to [facility] [place] [phone]) call the [place]: [phone] call 911   Following is a copy of your full provider care plan:   Goals Addressed             This Visit's Progress    CCM ([place]) EXPECTED OUTCOME: MONITOR, SELF-MANAGE AND REDUCE SYMPTOMS OF CHRONIC KIDNEY DISEASE       Current Barriers:  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 Chronic Disease Management support and education needs related to CKD stage 2 No Advanced Directives in place- pt declines information Patient reports he does not check blood pressure and not interested in doing, patient states he does not drink sugary soft drinks, tries to eat healthy as much as he can, does not exercise Patient states he is doing well, is now taking lisinopril  Planned Interventions: Assessed the patient     understanding of chronic kidney disease    Evaluation of current treatment plan related to chronic kidney disease self management and patient's adherence to plan as established by provider      Reviewed prescribed diet low sodium, heart healthy Counseled on the importance of exercise goals with target of 150 minutes per week     Advised patient, providing education and rationale, to monitor blood pressure daily and record, calling PCP for findings outside established parameters      Symptom Management: Take medications as prescribed   Attend all scheduled provider appointments Call pharmacy  for medication refills 3-7 days in advance of running out of medications Attend church or other social activities Perform all self care activities independently  Perform IADL's (shopping, preparing meals, housekeeping, managing finances) independently Call provider office for new concerns or questions  Drink adequate fluids- preferably water Try to get outside and walk daily Consider checking blood pressure  Follow Up Plan: Telephone follow up appointment with care management team member scheduled for:  09/15/22 at 1045 am       CCM (DIABETES) EXPECTED OUTCOME:  MONITOR, SELF-MANAGE AND REDUCE SYMPTOMS OF DIABETES       Current Barriers:  Knowledge Deficits related to management of Diabetes Chronic Disease Management support and education needs related to Diabetes, diet, exercise Non-adherence to prescribed medication regimen No Advanced Directives in place- pt declined  Patient reports he lives with spouse, is independent with all aspects of his care, continues to drive, does not exercise.  Patient states he is now taking lisinopril again. Patient reports he checks CBG QID with fasting ranges low 100's with reading of 110 today, random ranges 150-187. Patient reports [place] up in October 2023 due to had not been taking medication for diabetes because he ran out, pt is now working with [name] pharmacist, states taking medications as prescribed.  Planned Interventions: Provided education to patient about basic DM disease process; Reviewed medications with patient and discussed importance of medication adherence;        Reviewed prescribed diet with patient carbohydrate modified ; Discussed plans with patient for ongoing care management follow up and provided patient with  direct contact information for care management team;      Provided patient with written educational materials related to hypo and hyperglycemia and importance of correct treatment;       Advised patient, providing education and  rationale, to check cbg twice daily and record        Review of patient status, including review of consultants reports, relevant laboratory and other test results, and medications completed;        Symptom Management: Take medications as prescribed   Attend all scheduled provider appointments Call pharmacy for medication refills 3-7 days in advance of running out of medications Attend church or other social activities Perform all self care activities independently  Perform IADL's (shopping, preparing meals, housekeeping, managing finances) independently Call provider office for new concerns or questions  check blood sugar at prescribed times: 4 times daily check feet daily for cuts, sores or redness enter blood sugar readings and medication or insulin into daily log take the blood sugar log to all doctor visits take the blood sugar meter to all doctor visits trim toenails straight across fill half of plate with vegetables limit fast food meals to no more than 1 per week read food labels for fat, fiber, carbohydrates and portion size keep feet up while sitting wash and dry feet carefully every day  Follow Up Plan: Telephone follow up appointment with care management team member scheduled for:  09/15/22 at 1045 am          Patient verbalizes understanding of instructions and care plan provided today and agrees to view in [place]. Active MyChart status and patient understanding of how to access instructions and care plan via MyChart confirmed with patient.     Telephone follow up appointment with care management team member scheduled for:  09/15/22 at 1045 am

## 2022-07-07 NOTE — Chronic Care Management (AMB) (Signed)
Chronic Care Management   CCM RN Visit Note  07/07/2022 Name: Rick Lane MRN: [id number] DOB: 04/15/1951  Subjective: Rick Lane is a 71 y.o. year old male who is a primary care patient of Janora Norlander, DO. The patient was referred to the Chronic Care Management team for assistance with care management needs subsequent to provider initiation of CCM services and plan of care.    Today's Visit:  Engaged with patient by telephone for follow up visit.        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 Chronic Disease Management support and education needs related to CKD stage 2 No Advanced Directives in place- pt declines information Patient reports he does not check blood pressure and not interested in doing, patient states he does not drink sugary soft drinks, tries to eat healthy as much as he can, does not exercise Patient states he is doing well, is now taking lisinopril  Planned Interventions: Assessed the patient     understanding of chronic kidney disease    Evaluation of current treatment plan related to chronic kidney disease self management and patient's adherence to plan as established by provider      Reviewed prescribed diet low sodium, heart healthy Counseled on the importance of exercise goals with target of 150 minutes per week     Advised patient, providing education and rationale, to monitor blood pressure daily and record, calling PCP for findings outside established parameters      Symptom Management: Take medications as prescribed   Attend all scheduled provider appointments Call pharmacy for medication refills 3-7 days in advance of running out of medications Attend church or other social activities Perform all self care activities independently  Perform  IADL's (shopping, preparing meals, housekeeping, managing finances) independently Call provider office for new concerns or questions  Drink adequate fluids- preferably water Try to get outside and walk daily Consider checking blood pressure  Follow Up Plan: Telephone follow up appointment with care management team member scheduled for:  09/15/22 at 1045 am       CCM (DIABETES) EXPECTED OUTCOME:  MONITOR, SELF-MANAGE AND REDUCE SYMPTOMS OF DIABETES       Current Barriers:  Knowledge Deficits related to management of Diabetes Chronic Disease Management support and education needs related to Diabetes, diet, exercise Non-adherence to prescribed medication regimen No Advanced Directives in place- pt declined  Patient reports he lives with spouse, is independent with all aspects of his care, continues to drive, does not exercise.  Patient states he is now taking lisinopril again. Patient reports he checks CBG QID with fasting ranges low 100's with reading of 110 today, random ranges 150-187. Patient reports [place] up in October 2023 due to had not been taking medication for diabetes because he ran out, pt is now working with [facility] pharmacist, states taking medications as prescribed.  Planned Interventions: Provided education to patient about basic DM disease process; Reviewed medications with patient and discussed importance of medication adherence;        Reviewed prescribed diet with patient carbohydrate modified ; Discussed plans with patient for ongoing care management follow up and provided patient with direct contact information for care management team;      Provided patient with written educational  materials related to hypo and hyperglycemia and importance of correct treatment;       Advised patient, providing education and rationale, to check cbg twice daily and record        Review of patient status, including review of consultants reports, relevant laboratory and other test results, and  medications completed;        Symptom Management: Take medications as prescribed   Attend all scheduled provider appointments Call pharmacy for medication refills 3-7 days in advance of running out of medications Attend church or other social activities Perform all self care activities independently  Perform IADL's (shopping, preparing meals, housekeeping, managing finances) independently Call provider office for new concerns or questions  check blood sugar at prescribed times: 4 times daily check feet daily for cuts, sores or redness enter blood sugar readings and medication or insulin into daily log take the blood sugar log to all doctor visits take the blood sugar meter to all doctor visits trim toenails straight across fill half of plate with vegetables limit fast food meals to no more than 1 per week read food labels for fat, fiber, carbohydrates and portion size keep feet up while sitting wash and dry feet carefully every day  Follow Up Plan: Telephone follow up appointment with care management team member scheduled for:  09/15/22 at 1045 am          Plan:Telephone follow up appointment with care management team member scheduled for:  09/15/22 at [place] am  Jacqlyn Larsen [facility], BSN RN Case Manager [place] [phone]

## 2022-07-21 ENCOUNTER — Ambulatory Visit: Payer: Medicare HMO | Admitting: Pharmacist

## 2022-07-21 DIAGNOSIS — R809 Proteinuria, unspecified: Secondary | ICD-10-CM

## 2022-07-21 DIAGNOSIS — N182 Chronic kidney disease, stage 2 (mild): Secondary | ICD-10-CM

## 2022-07-21 MED ORDER — NOVOLOG FLEXPEN 100 UNIT/ML ~~LOC~~ SOPN: PEN_INJECTOR | SUBCUTANEOUS | 12 refills | Status: DC

## 2022-07-21 NOTE — Progress Notes (Signed)
Chronic Care Management Pharmacy Note  07/21/2022 Name:  Rick Lane MRN:  [id number] DOB:  1950-12-30  Summary:   Diabetes: UNControlled; current treatment: Tyler Aas 60units, novolog (sliding scale 8-10 units with meals; A1c 9.5%, GFR 75 Current glucose readings: fasting glucose: <130, post prandial glucose: <180 Patient is not interested in CGM at this time, he is content with traditional fingersticks Counseled patient to continue taking Lisinopril He reports he has been taking it for the last month since we talked last; stable Will introduce kerendia at f/u if proteinuria persists or worsens Requires comprehensive patient assistance and would like patient on ACEi as first line We can layer therapy as needed and as appropriate Consider GLP1 in the place of novolog for CV benefit--patient does not want to change therapy at this time Content and stable Denies hypoglycemic/hyperglycemic symptoms Discussed meal planning options and Plate method for healthy eating Avoid sugary drinks and desserts Incorporate balanced protein, non starchy veggies, 1 serving of carbohydrate with each meal Increase water intake Increase physical activity as able Current exercise: n/a Cholesterol now at goal LDL<70-continue rosuvastatin Recommended consider GLP1 as above; refills sent for novolog; patient compliant; continue ACEI   Patient Goals/Self-Care Activities Over the next 90 days, patient will:  - take medications as prescribed check glucose DAILY FASTING, document, and provide at future appointments collaborate with provider on medication access solutions  Follow Up Plan: Telephone follow up appointment with care management team member scheduled for: 3 months  Subjective: Said Rick Lane is an 71 y.o. year old male who is a primary patient of Janora Norlander, DO.  The patient was referred to the Chronic Care Management team for assistance with care management needs subsequent to provider  initiation of CCM services and plan of care.    Engaged with patient by telephone for follow up visit in response to provider referral for CCM services.   Objective:  LABS:    Lab Results  Component Value Date   CREATININE 1.06 02/12/2022   CREATININE 1.10 02/12/2021   CREATININE 1.19 08/18/2020     Lab Results  Component Value Date   HGBA1C 9.5 (H) 05/17/2022         Component Value Date/Time   CHOL 236 (H) 05/17/2022 1005   TRIG 234 (H) 05/17/2022 1005   HDL 37 (L) 05/17/2022 1005   CHOLHDL 6.4 (H) 05/17/2022 1005   LDLCALC 156 (H) 05/17/2022 1005   LDLDIRECT 84 06/27/2019 1339     Clinical ASCVD: No   The 10-year ASCVD risk score (Arnett DK, et al., 2019) is: 42.1%   Values used to calculate the score:     Age: 71 years     Sex: Male     Is Non-Hispanic African American: No     Diabetic: Yes     Tobacco smoker: No     Systolic Blood Pressure: 213 mmHg     Is BP treated: Yes     HDL Cholesterol: 37 mg/dL     Total Cholesterol: 236 mg/dL    Other: (CHADS2VASc if Afib, PHQ9 if depression, MMRC or CAT for COPD, ACT, DEXA)    BP Readings from Last 3 Encounters:  05/17/22 125/69  02/12/22 124/69  10/14/21 139/81      SDOH:  (Social Determinants of Health) assessments and interventions performed:    Allergies  Allergen Reactions   Bactrim [Sulfamethoxazole-Trimethoprim] Itching   Gentamicin Other (See Comments)    Unknown    Medications Reviewed Today     Reviewed  by Lavera Guise, [place] (Pharmacist) on 07/21/22 at 1643  Med List Status: <None>   Medication Order Taking? Sig Documenting Provider Last Dose Status Informant  buPROPion (WELLBUTRIN XL) 150 MG 24 hr tablet [id number] No Take 1 tablet by mouth once daily Gottschalk, Ashly M, DO Taking Active   cholecalciferol (VITAMIN D3) 25 MCG (1000 UNIT) tablet [id number] No Take 1,000 Units by mouth daily. [provider] Taking Active Self  insulin degludec (TRESIBA FLEXTOUCH) 200 UNIT/ML FlexTouch  Pen [id number] No Inject 60 Units into the skin at bedtime. Ronnie Doss M, DO Taking Active   lisinopril (ZESTRIL) 5 MG tablet [id number] No Take 1 tablet by mouth once daily Gottschalk, Ashly M, DO Taking Active   Multiple Vitamin (MULTIVITAMIN WITH MINERALS) TABS tablet [id number] No Take 1 tablet by mouth daily. Equate Multivitamin for Men 50+ [provider] Taking Active Self  NOVOLOG FLEXPEN 100 UNIT/ML FlexPen [id number]  Inject 10 Units into the skin 2 (two) times daily with breakfast and lunch AND 8-9 Units daily with supper. Ronnie Doss M, DO  Active   rosuvastatin (CRESTOR) 20 MG tablet [id number] No Take 1 tablet by mouth once daily Ronnie Doss M, DO Taking Active               Goals Addressed               This Visit's Progress     Patient Stated     T2DM PHARMD GOAL (pt-stated)        Current Barriers:  Unable to independently afford treatment regimen Unable to maintain control of T2DM, CKD  Pharmacist Clinical Goal(s):  Over the next 90 days, patient will verbalize ability to afford treatment regimen maintain control of T2DM as evidenced by IMPROVED GLYCEMIC CONTROL  through collaboration with PharmD and provider.    Interventions: 1:1 collaboration with Janora Norlander, DO regarding development and update of comprehensive plan of care as evidenced by provider attestation and co-signature Inter-disciplinary care team collaboration (see longitudinal plan of care) Comprehensive medication review performed; medication list updated in electronic medical record  Diabetes: Controlled; current treatment: Tresiba 60units, novolog (sliding scale 8-10 units with meals; A1c 5.8%, GFR 73 Current glucose readings: fasting glucose: <130, post prandial glucose: <180 Patient is not interested in CGM at this time, he is content with traditional fingersticks Counseled patient to continue taking Lisinopril He reports he has been taking it for the last  month since we talked last; stable Will introduce kerendia at f/u if proteinuria persists or worsens Requires comprehensive patient assistance and would like patient on ACEi as first line We can layer therapy as needed and as appropriate Consider GLP1 in the place of novolog for CV benefit--patient does not want to change therapy at this time Content and stable Denies hypoglycemic/hyperglycemic symptoms Discussed meal planning options and Plate method for healthy eating Avoid sugary drinks and desserts Incorporate balanced protein, non starchy veggies, 1 serving of carbohydrate with each meal Increase water intake Increase physical activity as able Current exercise: n/a Cholesterol now at goal LDL<70-continue rosuvastatin Recommended consider GLP1 as above; refills sent for novolog; patient compliant; continue ACEI   Patient Goals/Self-Care Activities Over the next 90 days, patient will:  - take medications as prescribed check glucose DAILY FASTING, document, and provide at future appointments collaborate with provider on medication access solutions  Follow Up Plan: Telephone follow up appointment with care management team member scheduled for: 3 months        Plan:  Telephone follow up appointment with care management team member scheduled for:  2 months     Regina Eck, PharmD, BCPS, [place] Clinical Pharmacist, [place]  II  T [phone]

## 2022-07-21 NOTE — Progress Notes (Signed)
  Care Management   Follow Up Note   07/02/2022 Name: Rick Lane MRN: [id number] DOB: 11/02/1950   Referred by: Janora Norlander, DO Reason for referral : diabetes   An unsuccessful telephone outreach was attempted today. The patient was referred to the case management team for assistance with care management and care coordination.   Follow Up Plan: Telephone follow up appointment with care management team member scheduled for: 3 weeks   Regina Eck, PharmD, BCPS, [place] Clinical Pharmacist, [place]  II  T [phone]

## 2022-07-25 DIAGNOSIS — N182 Chronic kidney disease, stage 2 (mild): Secondary | ICD-10-CM | POA: Diagnosis not present

## 2022-07-25 DIAGNOSIS — E1122 Type 2 diabetes mellitus with diabetic chronic kidney disease: Secondary | ICD-10-CM

## 2022-07-29 ENCOUNTER — Ambulatory Visit (INDEPENDENT_AMBULATORY_CARE_PROVIDER_SITE_OTHER): Payer: Medicare HMO

## 2022-07-29 VITALS — Ht 74.0 in | Wt 260.0 lb

## 2022-07-29 DIAGNOSIS — Z0000 Encounter for general adult medical examination without abnormal findings: Secondary | ICD-10-CM | POA: Diagnosis not present

## 2022-07-29 NOTE — Patient Instructions (Signed)
[name] , Thank you for taking time to come for your Medicare Wellness Visit. I appreciate your ongoing commitment to your health goals. Please review the following plan we discussed and let me know if I can assist you in the future.   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 Chronic Disease Management support and education needs related to CKD stage 2 No Advanced Directives in place- pt declines information Patient reports he does not check blood pressure and not interested in doing, patient states he does not drink sugary soft drinks, tries to eat healthy as much as he can, does not exercise Patient states he is doing well, is now taking lisinopril  Planned Interventions: Assessed the patient     understanding of chronic kidney disease    Evaluation of current treatment plan related to chronic kidney disease self management and patient's adherence to plan as established by provider      Reviewed prescribed diet low sodium, heart healthy Counseled on the importance of exercise goals with target of 150 minutes per week     Advised patient, providing education and rationale, to monitor blood pressure daily and record, calling PCP for findings outside established parameters      Symptom Management: Take medications as prescribed   Attend all scheduled provider appointments Call pharmacy for medication refills 3-7 days in advance of running out of medications Attend church or other social activities Perform all self care activities independently  Perform IADL's (shopping, preparing meals, housekeeping, managing finances) independently Call provider office for new concerns or questions  Drink adequate fluids- preferably water Try to get outside and walk daily Consider checking blood pressure  Follow Up  Plan: Telephone follow up appointment with care management team member scheduled for:  09/15/22 at 1045 am        CCM (DIABETES) EXPECTED OUTCOME:  MONITOR, SELF-MANAGE AND REDUCE SYMPTOMS OF DIABETES      Current Barriers:  Knowledge Deficits related to management of Diabetes Chronic Disease Management support and education needs related to Diabetes, diet, exercise Non-adherence to prescribed medication regimen No Advanced Directives in place- pt declined  Patient reports he lives with spouse, is independent with all aspects of his care, continues to drive, does not exercise.  Patient states he is now taking lisinopril again. Patient reports he checks CBG QID with fasting ranges low 100's with reading of 110 today, random ranges 150-187. Patient reports [place] up in October 2023 due to had not been taking medication for diabetes because he ran out, pt is now working with [facility] pharmacist, states taking medications as prescribed.  Planned Interventions: Provided education to patient about basic DM disease process; Reviewed medications with patient and discussed importance of medication adherence;        Reviewed prescribed diet with patient carbohydrate modified ; Discussed plans with patient for ongoing care management follow up and provided patient with direct contact information for care management team;      Provided patient with written educational materials related to hypo and hyperglycemia and importance of correct treatment;       Advised patient, providing education and rationale, to check cbg twice daily and record        Review of patient status, including review of consultants reports, relevant laboratory and other test  results, and medications completed;        Symptom Management: Take medications as prescribed   Attend all scheduled provider appointments Call pharmacy for medication refills 3-7 days in advance of running out of medications Attend church or other social  activities Perform all self care activities independently  Perform IADL's (shopping, preparing meals, housekeeping, managing finances) independently Call provider office for new concerns or questions  check blood sugar at prescribed times: 4 times daily check feet daily for cuts, sores or redness enter blood sugar readings and medication or insulin into daily log take the blood sugar log to all doctor visits take the blood sugar meter to all doctor visits trim toenails straight across fill half of plate with vegetables limit fast food meals to no more than 1 per week read food labels for fat, fiber, carbohydrates and portion size keep feet up while sitting wash and dry feet carefully every day  Follow Up Plan: Telephone follow up appointment with care management team member scheduled for:  09/15/22 at 1045 am        DIET - INCREASE WATER INTAKE      Increase physical activity      T2DM PHARMD GOAL (pt-stated)      Current Barriers:  Unable to independently afford treatment regimen Unable to maintain control of T2DM, CKD  Pharmacist Clinical Goal(s):  Over the next 90 days, patient will verbalize ability to afford treatment regimen maintain control of T2DM as evidenced by IMPROVED GLYCEMIC CONTROL  through collaboration with PharmD and provider.    Interventions: 1:1 collaboration with Janora Norlander, DO regarding development and update of comprehensive plan of care as evidenced by provider attestation and co-signature Inter-disciplinary care team collaboration (see longitudinal plan of care) Comprehensive medication review performed; medication list updated in electronic medical record  Diabetes: Controlled; current treatment: Tresiba 60units, novolog (sliding scale 8-10 units with meals; A1c 5.8-->9.5%, GFR 73--75 Current glucose readings: fasting glucose: <130, post prandial glucose: <180 Patient is not interested in CGM at this time, he is content with traditional  fingersticks Counseled patient to continue taking Lisinopril He reports he has been taking it for the last month since we talked last; stable Will introduce kerendia at f/u if proteinuria persists or worsens Requires comprehensive patient assistance and would like patient on ACEi as first line We can layer therapy as needed and as appropriate Consider GLP1 in the place of novolog for CV benefit--patient does not want to change therapy at this time Content and stable Denies hypoglycemic/hyperglycemic symptoms Discussed meal planning options and Plate method for healthy eating Avoid sugary drinks and desserts Incorporate balanced protein, non starchy veggies, 1 serving of carbohydrate with each meal Increase water intake Increase physical activity as able Current exercise: n/a Cholesterol now at goal LDL<70-continue rosuvastatin Recommended consider GLP1 as above; refills sent for novolog; patient compliant; continue ACEI   Patient Goals/Self-Care Activities Over the next 90 days, patient will:  - take medications as prescribed check glucose DAILY FASTING, document, and provide at future appointments collaborate with provider on medication access solutions  Follow Up Plan: Telephone follow up appointment with care management team member scheduled for: 3 months        This is a list of the screening recommended for you and due dates:  Health Maintenance  Topic Date Due   COVID-19 Vaccine (1) Never done   DTaP/Tdap/Td vaccine (1 - Tdap) Never done   Pneumonia Vaccine (1 - PCV) Never done   Eye exam  for diabetics  03/11/2022   Complete foot exam   06/15/2022   Flu Shot  10/24/2022*   Hemoglobin A1C  11/16/2022   Yearly kidney function blood test for diabetes  02/13/2023   Yearly kidney health urinalysis for diabetes  05/18/2023   Medicare Annual Wellness Visit  07/30/2023   Colon Cancer Screening  12/18/2029   Hepatitis C Screening: USPSTF Recommendation to screen - Ages 45-79  yo.  Completed   Zoster (Shingles) Vaccine  Completed   HPV Vaccine  Aged Out  *Topic was postponed. The date shown is not the original due date.    Advanced directives: Advance directive discussed with you today. I have provided a copy for you to complete at home and have notarized. Once this is complete please bring a copy in to our office so we can scan it into your chart.   Conditions/risks identified: Advance directive discussed with you today. I have provided a copy for you to complete at home and have notarized. Once this is complete please bring a copy in to our office so we can scan it into your chart.   Next appointment: Follow up in one year for your annual wellness visit.   Preventive Care 11 Years and Older, Male  Preventive care refers to lifestyle choices and visits with your health care provider that can promote health and wellness. What does preventive care include? A yearly physical exam. This is also called an annual well check. Dental exams once or twice a year. Routine eye exams. Ask your health care provider how often you should have your eyes checked. Personal lifestyle choices, including: Daily care of your teeth and gums. Regular physical activity. Eating a healthy diet. Avoiding tobacco and drug use. Limiting alcohol use. Practicing safe sex. Taking low doses of aspirin every day. Taking vitamin and mineral supplements as recommended by your health care provider. What happens during an annual well check? The services and screenings done by your health care provider during your annual well check will depend on your age, overall health, lifestyle risk factors, and family history of disease. Counseling  Your health care provider may ask you questions about your: Alcohol use. Tobacco use. Drug use. Emotional well-being. Home and relationship well-being. Sexual activity. Eating habits. History of falls. Memory and ability to understand (cognition). Work  and work Statistician. Screening  You may have the following tests or measurements: Height, weight, and BMI. Blood pressure. Lipid and cholesterol levels. These may be checked every 5 years, or more frequently if you are over 4 years old. Skin check. Lung cancer screening. You may have this screening every year starting at age 53 if you have a 30-pack-year history of smoking and currently smoke or have quit within the past 15 years. Fecal occult blood test (FOBT) of the stool. You may have this test every year starting at age 27. Flexible sigmoidoscopy or colonoscopy. You may have a sigmoidoscopy every 5 years or a colonoscopy every 10 years starting at age 22. Prostate cancer screening. Recommendations will vary depending on your family history and other risks. Hepatitis C blood test. Hepatitis B blood test. Sexually transmitted disease (STD) testing. Diabetes screening. This is done by checking your blood sugar (glucose) after you have not eaten for a while (fasting). You may have this done every 1-3 years. Abdominal aortic aneurysm (AAA) screening. You may need this if you are a current or former smoker. Osteoporosis. You may be screened starting at age 76 if you are at  high risk. Talk with your health care provider about your test results, treatment options, and if necessary, the need for more tests. Vaccines  Your health care provider may recommend certain vaccines, such as: Influenza vaccine. This is recommended every year. Tetanus, diphtheria, and acellular pertussis (Tdap, Td) vaccine. You may need a Td booster every 10 years. Zoster vaccine. You may need this after age 9. Pneumococcal 13-valent conjugate (PCV13) vaccine. One dose is recommended after age 2. Pneumococcal polysaccharide (PPSV23) vaccine. One dose is recommended after age 23. Talk to your health care provider about which screenings and vaccines you need and how often you need them. This information is not intended  to replace advice given to you by your health care provider. Make sure you discuss any questions you have with your health care provider. Document Released: 08/08/2015 Document Revised: 03/31/2016 Document Reviewed: 05/13/2015 Elsevier Interactive Patient Education  2017 [place] Prevention in the Home Falls can cause injuries. They can happen to people of all ages. There are many things you can do to make your home safe and to help prevent falls. What can I do on the outside of my home? Regularly fix the edges of walkways and driveways and fix any cracks. Remove anything that might make you trip as you walk through a door, such as a raised step or threshold. Trim any bushes or trees on the path to your home. Use bright outdoor lighting. Clear any walking paths of anything that might make someone trip, such as rocks or tools. Regularly check to see if handrails are loose or broken. Make sure that both sides of any steps have handrails. Any raised decks and porches should have guardrails on the edges. Have any leaves, snow, or ice cleared regularly. Use sand or salt on walking paths during winter. Clean up any spills in your garage right away. This includes oil or grease spills. What can I do in the bathroom? Use night lights. Install grab bars by the toilet and in the tub and shower. Do not use towel bars as grab bars. Use non-skid mats or decals in the tub or shower. If you need to sit down in the shower, use a plastic, non-slip stool. Keep the floor dry. Clean up any water that spills on the floor as soon as it happens. Remove soap buildup in the tub or shower regularly. Attach bath mats securely with double-sided non-slip rug tape. Do not have throw rugs and other things on the floor that can make you trip. What can I do in the bedroom? Use night lights. Make sure that you have a light by your bed that is easy to reach. Do not use any sheets or blankets that are too big  for your bed. They should not hang down onto the floor. Have a firm chair that has side arms. You can use this for support while you get dressed. Do not have throw rugs and other things on the floor that can make you trip. What can I do in the kitchen? Clean up any spills right away. Avoid walking on wet floors. Keep items that you use a lot in easy-to-reach places. If you need to reach something above you, use a strong step stool that has a grab bar. Keep electrical cords out of the way. Do not use floor polish or wax that makes floors slippery. If you must use wax, use non-skid floor wax. Do not have throw rugs and other things on the floor that  can make you trip. What can I do with my stairs? Do not leave any items on the stairs. Make sure that there are handrails on both sides of the stairs and use them. Fix handrails that are broken or loose. Make sure that handrails are as long as the stairways. Check any carpeting to make sure that it is firmly attached to the stairs. Fix any carpet that is loose or worn. Avoid having throw rugs at the top or bottom of the stairs. If you do have throw rugs, attach them to the floor with carpet tape. Make sure that you have a light switch at the top of the stairs and the bottom of the stairs. If you do not have them, ask someone to add them for you. What else can I do to help prevent falls? Wear shoes that: Do not have high heels. Have rubber bottoms. Are comfortable and fit you well. Are closed at the toe. Do not wear sandals. If you use a stepladder: Make sure that it is fully opened. Do not climb a closed stepladder. Make sure that both sides of the stepladder are locked into place. Ask someone to hold it for you, if possible. Clearly mark and make sure that you can see: Any grab bars or handrails. First and last steps. Where the edge of each step is. Use tools that help you move around (mobility aids) if they are needed. These  include: Canes. Walkers. Scooters. Crutches. Turn on the lights when you go into a dark area. Replace any light bulbs as soon as they burn out. Set up your furniture so you have a clear path. Avoid moving your furniture around. If any of your floors are uneven, fix them. If there are any pets around you, be aware of where they are. Review your medicines with your doctor. Some medicines can make you feel dizzy. This can increase your chance of falling. Ask your doctor what other things that you can do to help prevent falls. This information is not intended to replace advice given to you by your health care provider. Make sure you discuss any questions you have with your health care provider. Document Released: 05/08/2009 Document Revised: 12/18/2015 Document Reviewed: 08/16/2014 Elsevier Interactive Patient Education  2017 [employer].

## 2022-07-29 NOTE — Progress Notes (Signed)
Subjective:   Rick Lane is a 72 y.o. male who presents for Medicare Annual/Subsequent preventive examination. I connected with  Rick Lane on 07/29/22 by a audio enabled telemedicine application and verified that I am speaking with the correct person using two identifiers.  Patient Location: Home  Provider Location: Home Office  I discussed the limitations of evaluation and management by telemedicine. The patient expressed understanding and agreed to proceed.  Review of Systems     Cardiac Risk Factors include: advanced age (>43mn, >>2women);diabetes mellitus;hypertension;male gender;dyslipidemia     Objective:    Today's Vitals   07/29/22 1415  Weight: 260 lb (117.9 kg)  Height: _0  (1.88 m)   Body mass index is 33.38 kg/m.     07/29/2022    2:18 PM 06/01/2022   10:23 AM 07/22/2021    1:27 PM 12/19/2019   10:38 AM 10/02/2018    7:23 AM 09/25/2018    1:40 PM 11/10/2016   12:57 PM  Advanced Directives  Does Patient Have a Medical Advance Directive? _1  No No  Would patient like information on creating a medical advance directive? No - Patient declined No - Patient declined No - Patient declined No - Patient declined No - Patient declined No - Patient declined No - Patient declined    Current Medications (verified) Outpatient Encounter Medications as of 07/29/2022  Medication Sig   buPROPion (WELLBUTRIN XL) 150 MG 24 hr tablet Take 1 tablet by mouth once daily   cholecalciferol (VITAMIN D3) 25 MCG (1000 UNIT) tablet Take 1,000 Units by mouth daily.   insulin degludec (TRESIBA FLEXTOUCH) 200 UNIT/ML FlexTouch Pen Inject 60 Units into the skin at bedtime.   lisinopril (ZESTRIL) 5 MG tablet Take 1 tablet by mouth once daily   Multiple Vitamin (MULTIVITAMIN WITH MINERALS) TABS tablet Take 1 tablet by mouth daily. Equate Multivitamin for Men 50+   NOVOLOG FLEXPEN 100 UNIT/ML FlexPen Inject 10 Units into the skin 2 (two) times daily with breakfast and lunch AND 8-9  Units daily with supper.   rosuvastatin (CRESTOR) 20 MG tablet Take 1 tablet by mouth once daily   No facility-administered encounter medications on file as of 07/29/2022.    Allergies (verified) Bactrim [sulfamethoxazole-trimethoprim] and Gentamicin   History: 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 11/02/2016   Depression    Diabetes mellitus without complication ([place]    Type II   Hyperlipidemia    Neuropathy    Neuropathy    Sleep apnea    Past Surgical History:  Procedure Laterality Date   AMPUTATION Right 12/12/2015   Procedure: Right Transmetatarsal Amputation With Prevena VAC Placement;  Surgeon: MNewt Minion MD;  [place] [facility]  Service: Orthopedics;  Laterality: Right;   AMPUTATION Right 03/05/2016   Procedure: RIGHT BELOW KNEE AMPUTATION;  Surgeon: MNewt Minion MD;  [place] [facility]  Service: Orthopedics;  Laterality: Right;   CATARACT EXTRACTION W/PHACO Right 10/02/2018   Procedure: CATARACT EXTRACTION PHACO AND INTRAOCULAR LENS PLACEMENT (IOC);  Surgeon: WBaruch Goldmann MD;  Location: [facility];  Service: Ophthalmology;  Laterality: Right;  CDE: 130.53   COLONOSCOPY     COLONOSCOPY N/A 12/19/2019   Procedure: COLONOSCOPY;  Surgeon: RDaneil Dolin MD;  Diverticulosis in the sigmoid and descending colon, redundant colon, otherwise normal exam.    EYE SURGERY Left  Cataract   EYE SURGERY Right    r   ORBITAL RECONSTRUCTION Right    STUMP REVISION Right 11/10/2016   Procedure: REVISION RIGHT BELOW KNEE AMPUTATION;  Surgeon: [name], MD;  Location: [facility];  Service: Orthopedics;  Laterality: Right;   toe removal Right 2012,2014   Family History  Problem Relation Age of Onset   Cirrhosis Mother        secondary to alcohol abuse.    Heart disease Father    Diabetes Sister    Diabetes Brother    Heart attack Paternal Grandfather    Colon cancer Maternal  Grandfather        passed in the mid-60's.   Social History   Socioeconomic History   Marital status: Married    Spouse name: Not on file   Number of children: 0   Years of education: 14   Highest education level: Not on file  Occupational History   Occupation: Scientist, research (physical sciences)    Comment: [employer]   Occupation: retired  Tobacco Use   Smoking status: Never   Smokeless tobacco: Never  Scientific laboratory technician Use: Never used  Substance and Sexual Activity   Alcohol use: No   Drug use: No   Sexual activity: Not Currently    Birth control/protection: None  Other Topics Concern   Not on file  Social History Narrative   Not on file   Social Determinants of Health   Financial Resource Strain: Low Risk  (07/29/2022)   Overall Financial Resource Strain (CARDIA)    Difficulty of Paying Living Expenses: Not hard at all  Food Insecurity: No Food Insecurity (07/29/2022)   Hunger Vital Sign    Worried About Running Out of Food in the Last Year: Never true    [place] in the Last Year: Never true  Transportation Needs: No Transportation Needs (07/29/2022)   PRAPARE - Hydrologist (Medical): No    Lack of Transportation (Non-Medical): No  Physical Activity: Inactive (07/29/2022)   Exercise Vital Sign    Days of Exercise per Week: 0 days    Minutes of Exercise per Session: 0 min  Stress: No Stress Concern Present (07/29/2022)   [place]    Feeling of Stress : Not at all  Social Connections: [place] (07/29/2022)   Social Connection and Isolation Panel [NHANES]    Frequency of Communication with Friends and Family: More than three times a week    Frequency of Social Gatherings with Friends and Family: More than three times a week    Attends Religious Services: More than 4 times per year    Active Member of [employer] or Organizations: Yes    Attends Music therapist: More than 4  times per year    Marital Status: Married    Tobacco Counseling Counseling given: Not Answered   Clinical Intake:  Pre-visit preparation completed: Yes  Pain : No/denies pain     Nutritional Risks: None Diabetes: Yes CBG done?: No Did pt. bring in CBG monitor from home?: No  How often do you need to have someone help you when you read instructions, pamphlets, or other written materials from your doctor or pharmacy?: 1 - Never  Diabetic?yes Nutrition Risk Assessment:  Has the patient had any N/V/D within the last 2 months?  No  Does the patient have any non-healing wounds?  No  Has the patient had any  unintentional weight loss or weight gain?  No   Diabetes:  Is the patient diabetic?  Yes  If diabetic, was a CBG obtained today?  No  Did the patient bring in their glucometer from home?  No  How often do you monitor your CBG's? 4 x day.   Financial Strains and Diabetes Management:  Are you having any financial strains with the device, your supplies or your medication? No .  Does the patient want to be seen by Chronic Care Management for management of their diabetes?  No  Would the patient like to be referred to a Nutritionist or for Diabetic Management?  No   Diabetic Exams:  Diabetic Eye Exam: Completed 02/2022 Diabetic Foot Exam: Overdue, Pt has been advised about the importance in completing this exam. Pt is scheduled for diabetic foot exam on next office visit .   Interpreter Needed?: No  Information entered by :: Jadene Pierini, LPN   Activities of Daily Living    07/29/2022    2:18 PM 06/01/2022   10:08 AM  In your present state of health, do you have any difficulty performing the following activities:  Hearing? 0 0  Vision? 0 0  Difficulty concentrating or making decisions? 0 0  Walking or climbing stairs? 0 0  Dressing or bathing? 0 0  Doing errands, shopping? 0 0  Preparing Food and eating ? N N  Using the Toilet? N N  In the past six months, have  you accidently leaked urine? N N  Do you have problems with loss of bowel control? N N  Managing your Medications? N N  Managing your Finances? N N  Housekeeping or managing your Housekeeping? N N    Patient Care Team: Janora Norlander, DO as PCP - General (Family Medicine) Gala Romney Cristopher Estimable, MD as Consulting Physician (Gastroenterology) Kassie Mends, RN as [place] Management [place], Royce Macadamia, [facility] as [place] Management (Pharmacist)  Indicate any recent Medical Services you may have received from other than [facility] providers in the past year (date may be approximate).     Assessment:   This is a routine wellness examination for Angelica.  Hearing/Vision screen Vision Screening - Comments:: Wears rx glasses - up to date with routine eye exams with  my eye doctor   Dietary issues and exercise activities discussed: Current Exercise Habits: The patient does not participate in regular exercise at present   Goals Addressed             This Visit's Progress    DIET - INCREASE WATER INTAKE         Depression Screen    07/29/2022    2:18 PM 07/29/2022    2:17 PM 06/01/2022   10:08 AM 05/17/2022    9:56 AM 02/12/2022   10:25 AM 10/14/2021   10:12 AM 07/22/2021    1:28 PM  PHQ 2/9 Scores  PHQ - 2 Score 0 0 0 0 2 1 0  PHQ- 9 Score     3 1     Fall Risk    07/29/2022    2:16 PM 06/01/2022   10:08 AM 05/17/2022    9:56 AM 02/12/2022   10:25 AM 10/14/2021   10:12 AM  Fall Risk   Falls in the past year? 0 0 0 0 0  Number falls in past yr: 0      Injury with Fall? 0      Risk for fall due  to : No Fall Risks      Follow up Falls prevention discussed        FALL RISK PREVENTION PERTAINING TO THE HOME:  Any stairs in or around the home? Yes  If so, are there any without handrails? No  Home free of loose throw rugs in walkways, pet beds, electrical cords, etc? Yes  Adequate lighting in your home to reduce risk of falls? Yes   ASSISTIVE  DEVICES UTILIZED TO PREVENT FALLS:  Life alert? No  Use of a cane, walker or w/c? Yes  Grab bars in the bathroom? Yes  Shower chair or bench in shower? Yes  Elevated toilet seat or a handicapped toilet? Yes          07/29/2022    2:19 PM 07/22/2021    1:31 PM  6CIT Screen  What Year? 0 points 0 points  What month? 0 points 0 points  What time? 0 points 0 points  Count back from 20 0 points 0 points  Months in reverse 0 points 0 points  Repeat phrase 0 points 2 points  Total Score 0 points 2 points    Immunizations Immunization History  Administered Date(s) Administered   Zoster Recombinat (Shingrix) 01/31/2021, 04/15/2021    TDAP status: Due, Education has been provided regarding the importance of this vaccine. Advised may receive this vaccine at local pharmacy or Health Dept. Aware to provide a copy of the vaccination record if obtained from local pharmacy or Health Dept. Verbalized acceptance and understanding.  Flu Vaccine status: Due, Education has been provided regarding the importance of this vaccine. Advised may receive this vaccine at local pharmacy or Health Dept. Aware to provide a copy of the vaccination record if obtained from local pharmacy or Health Dept. Verbalized acceptance and understanding.  Pneumococcal vaccine status: Due, Education has been provided regarding the importance of this vaccine. Advised may receive this vaccine at local pharmacy or Health Dept. Aware to provide a copy of the vaccination record if obtained from local pharmacy or Health Dept. Verbalized acceptance and understanding.  Covid-19 vaccine status: Completed vaccines  Qualifies for Shingles Vaccine? Yes   Zostavax completed Yes   Shingrix Completed?: Yes  Screening Tests Health Maintenance  Topic Date Due   COVID-19 Vaccine (1) Never done   DTaP/Tdap/Td (1 - Tdap) Never done   Pneumonia Vaccine 62+ Years old (1 - PCV) Never done   OPHTHALMOLOGY EXAM  03/11/2022   FOOT EXAM   06/15/2022   INFLUENZA VACCINE  10/24/2022 (Originally 02/23/2022)   HEMOGLOBIN A1C  11/16/2022   Diabetic kidney evaluation - eGFR measurement  02/13/2023   Diabetic kidney evaluation - Urine ACR  05/18/2023   Medicare Annual Wellness (AWV)  07/30/2023   COLONOSCOPY (Pts 45-73yr Insurance coverage will need to be confirmed)  12/18/2029   Hepatitis C Screening  Completed   Zoster Vaccines- Shingrix  Completed   HPV VACCINES  Aged Out    Health Maintenance  Health Maintenance Due  Topic Date Due   COVID-19 Vaccine (1) Never done   DTaP/Tdap/Td (1 - Tdap) Never done   Pneumonia Vaccine 71 Years old (1 - PCV) Never done   OPHTHALMOLOGY EXAM  03/11/2022   FOOT EXAM  06/15/2022    Colorectal cancer screening: Type of screening: Colonoscopy. Completed 12/19/2019. Repeat every 10 years  Lung Cancer Screening: (Low Dose CT Chest recommended if Age 71-80years, 30 pack-year currently smoking OR have quit w/in 15years.) does not qualify.   Lung Cancer  Screening Referral: n/a  Additional Screening:  Hepatitis C Screening: does not qualify;   Vision Screening: Recommended annual ophthalmology exams for early detection of glaucoma and other disorders of the eye. Is the patient up to date with their annual eye exam?  Yes  Who is the provider or what is the name of the office in which the patient attends annual eye exams? My eye Doctor  If pt is not established with a provider, would they like to be referred to a provider to establish care? No .   Dental Screening: Recommended annual dental exams for proper oral hygiene  Community Resource Referral / Chronic Care Management: CRR required this visit?  No   CCM required this visit?  No      Plan:     I have personally reviewed and noted the following in the patient's chart:   Medical and social history Use of alcohol, tobacco or illicit drugs  Current medications and supplements including opioid prescriptions. Patient is not  currently taking opioid prescriptions. Functional ability and status Nutritional status Physical activity Advanced directives List of other physicians Hospitalizations, surgeries, and ER visits in previous 12 months Vitals Screenings to include cognitive, depression, and falls Referrals and appointments  In addition, I have reviewed and discussed with patient certain preventive protocols, quality metrics, and best practice recommendations. A written personalized care plan for preventive services as well as general preventive health recommendations were provided to patient.     Daphane Shepherd, LPN   01/26/1637   Nurse Notes: Due TDAP /pneumonia vaccine

## 2022-08-20 ENCOUNTER — Encounter: Payer: Self-pay | Admitting: Family Medicine

## 2022-08-20 ENCOUNTER — Ambulatory Visit (INDEPENDENT_AMBULATORY_CARE_PROVIDER_SITE_OTHER): Payer: Medicare HMO | Admitting: Family Medicine

## 2022-08-20 VITALS — BP 134/78 | HR 81 | Temp 98.5°F | Ht 74.0 in | Wt 268.0 lb

## 2022-08-20 DIAGNOSIS — E785 Hyperlipidemia, unspecified: Secondary | ICD-10-CM

## 2022-08-20 DIAGNOSIS — E1129 Type 2 diabetes mellitus with other diabetic kidney complication: Secondary | ICD-10-CM

## 2022-08-20 DIAGNOSIS — R809 Proteinuria, unspecified: Secondary | ICD-10-CM

## 2022-08-20 DIAGNOSIS — I152 Hypertension secondary to endocrine disorders: Secondary | ICD-10-CM | POA: Diagnosis not present

## 2022-08-20 DIAGNOSIS — E1159 Type 2 diabetes mellitus with other circulatory complications: Secondary | ICD-10-CM

## 2022-08-20 DIAGNOSIS — E1169 Type 2 diabetes mellitus with other specified complication: Secondary | ICD-10-CM | POA: Diagnosis not present

## 2022-08-20 DIAGNOSIS — Z794 Long term (current) use of insulin: Secondary | ICD-10-CM | POA: Diagnosis not present

## 2022-08-20 DIAGNOSIS — Z89511 Acquired absence of right leg below knee: Secondary | ICD-10-CM

## 2022-08-20 LAB — CBC WITH DIFFERENTIAL/PLATELET
Basophils Absolute: 0 10*3/uL (ref 0.0–0.2)
Basos: 1 %
EOS (ABSOLUTE): 0.2 10*3/uL (ref 0.0–0.4)
Eos: 4 %
Hematocrit: 43.7 % (ref 37.5–51.0)
Hemoglobin: 14.7 g/dL (ref 13.0–17.7)
Immature Grans (Abs): 0 10*3/uL (ref 0.0–0.1)
Immature Granulocytes: 0 %
Lymphocytes Absolute: 1 10*3/uL (ref 0.7–3.1)
Lymphs: 22 %
MCH: 30.8 pg (ref 26.6–33.0)
MCHC: 33.6 g/dL (ref 31.5–35.7)
MCV: 92 fL (ref 79–97)
Monocytes Absolute: 0.4 10*3/uL (ref 0.1–0.9)
Monocytes: 8 %
Neutrophils Absolute: 3 10*3/uL (ref 1.4–7.0)
Neutrophils: 65 %
Platelets: 195 10*3/uL (ref 150–450)
RBC: 4.77 x10E6/uL (ref 4.14–5.80)
RDW: 12.6 % (ref 11.6–15.4)
WBC: 4.6 10*3/uL (ref 3.4–10.8)

## 2022-08-20 LAB — BAYER DCA HB A1C WAIVED: HB A1C (BAYER DCA - WAIVED): 6.4 % — ABNORMAL HIGH (ref 4.8–5.6)

## 2022-08-20 NOTE — Progress Notes (Signed)
Subjective: CC:Dm PCP: Janora Norlander, DO [id number] Clapper is a 72 y.o. male presenting to clinic today for:  1. Type 2 Diabetes with hypertension, hyperlipidemia:  Compliant with 50 units of insulin, 8 units of mealtime insulin.  Blood sugars running anywhere between 75 and 209.  Last eye exam: Needs Last foot exam: Needs Last A1c:  Lab Results  Component Value Date   HGBA1C 9.5 (H) 05/17/2022   Nephropathy screen indicated?:  Up-to-date Last flu, zoster and/or pneumovax:  Immunization History  Administered Date(s) Administered   Zoster Recombinat (Shingrix) 01/31/2021, 04/15/2021    ROS: No chest pain or shortness of breath, ulcerations.  Has chronic numbness and tingling of the left foot.    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 ([place]) 11/02/2016   Depression    Diabetes mellitus without complication (HCC)    Type II   Hyperlipidemia    Neuropathy    Neuropathy    Sleep apnea     Current Outpatient Medications:    buPROPion (WELLBUTRIN XL) 150 MG 24 hr tablet, Take 1 tablet by mouth once daily, Disp: 90 tablet, Rfl: 0   cholecalciferol (VITAMIN D3) 25 MCG (1000 UNIT) tablet, Take 1,000 Units by mouth daily., Disp: , Rfl:    insulin degludec (TRESIBA FLEXTOUCH) 200 UNIT/ML FlexTouch Pen, Inject 60 Units into the skin at bedtime., Disp: 9 mL, Rfl: 3   lisinopril (ZESTRIL) 5 MG tablet, Take 1 tablet by mouth once daily, Disp: 90 tablet, Rfl: 0   Multiple Vitamin (MULTIVITAMIN WITH MINERALS) TABS tablet, Take 1 tablet by mouth daily. Equate Multivitamin for Men 50+, Disp: , Rfl:    NOVOLOG FLEXPEN 100 UNIT/ML FlexPen, Inject 10 Units into the skin 2 (two) times daily with breakfast and lunch AND 8-9 Units daily with  supper., Disp: 15 mL, Rfl: 12   rosuvastatin (CRESTOR) 20 MG tablet, Take 1 tablet by mouth once daily, Disp: 90 tablet, Rfl: 0 Social History   Socioeconomic History   Marital status: Married    Spouse name: Not on file   Number of children: 0   Years of education: 14   Highest education level: Not on file  Occupational History   Occupation: Scientist, research (physical sciences)    Comment: Film/video editor   Occupation: retired  Tobacco Use   Smoking status: Never   Smokeless tobacco: Never  Scientific laboratory technician Use: Never used  Substance and Sexual Activity   Alcohol use: No   Drug use: No   Sexual activity: Not Currently    Birth control/protection: None  Other Topics Concern   Not on file  Social History Narrative   Not on file   Social Determinants of Health   Financial Resource Strain: Low Risk  (07/29/2022)   Overall Financial Resource Strain (CARDIA)    Difficulty of Paying Living Expenses: Not hard at all  Food Insecurity: No Food Insecurity (07/29/2022)   Hunger Vital Sign    Worried About Running Out of Food in the Last Year: Never true    Ran Out of Food in the Last Year: Never true  Transportation Needs: No Transportation Needs (07/29/2022)   PRAPARE - Transportation    Lack of Transportation (Medical): No    Lack  of Transportation (Non-Medical): No  Physical Activity: Inactive (07/29/2022)   Exercise Vital Sign    Days of Exercise per Week: 0 days    Minutes of Exercise per Session: 0 min  Stress: No Stress Concern Present (07/29/2022)   [place]    Feeling of Stress : Not at all  Social Connections: [place] (07/29/2022)   Social Connection and Isolation Panel [NHANES]    Frequency of Communication with Friends and Family: More than three times a week    Frequency of Social Gatherings with Friends and Family: More than three times a week    Attends Religious Services: More than 4 times per year    Active Member of  [employer] or Organizations: Yes    Attends Music therapist: More than 4 times per year    Marital Status: Married  Human resources officer Violence: Not At Risk (07/29/2022)   Humiliation, Afraid, Rape, and Kick questionnaire    Fear of Current or Ex-Partner: No    Emotionally Abused: No    Physically Abused: No    Sexually Abused: No   Family History  Problem Relation Age of Onset   Cirrhosis Mother        secondary to alcohol abuse.    Heart disease Father    Diabetes Sister    Diabetes Brother    Heart attack Paternal Grandfather    Colon cancer Maternal Grandfather        passed in the mid-60's.    Objective: Office vital signs reviewed. BP 134/78   Pulse 81   Temp 98.5 F (36.9 C)   Ht '6\' 2"'$  (1.88 m)   Wt 268 lb (121.6 kg)   SpO2 95%   BMI 34.41 kg/m   Physical Examination:  General: Awake, alert, well nourished, No acute distress HEENT: sclera white, MMM Cardio: regular rate and rhythm, S1S2 heard, no murmurs appreciated Pulm: clear to auscultation bilaterally, no wheezes, rhonchi or rales; normal work of breathing on room air MSK: Right lower extremity surgically absent.  Ambulating independently.  Utilizes prosthetic Skin: dry; intact; no rashes or lesions Neuro: see DM  Diabetic Foot Exam - Simple   Simple Foot Form Diabetic Foot exam was performed with the following findings: Yes 08/20/2022 10:22 AM  Visual Inspection See comments: Yes Sensation Testing See comments: Yes Pulse Check Posterior Tibialis and Dorsalis pulse intact bilaterally: Yes Comments Totally absent monofilament sensation left foot.  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      Assessment/ Plan: 72 y.o. male   Type 2 diabetes mellitus with microalbuminuria, with long-term current use of insulin ([place]) - Plan: Bayer DCA Hb A1c Waived, CBC with Differential/Platelet  Hyperlipidemia associated with type 2 diabetes mellitus ([place])  S/P unilateral BKA (below knee  amputation), right ([place])  Sugar now well-controlled A1c dropping to 6.4.  Continue to monitor blood sugars closely.  No changes to regimen.  Diabetic foot exam performed today.  Eligible for diabetic shoes.  Does not want to have these at this time so we will give Rx when he is ready  Continue current regimen for cholesterol control.  Will need fasting lipid at next visit  Orders Placed This Encounter  Procedures   Bayer [place] Hb A1c Waived   CBC with Differential/Platelet   No orders of the defined types were placed in this encounter.    Janora Norlander, DO [place] [phone]

## 2022-08-30 ENCOUNTER — Other Ambulatory Visit: Payer: Self-pay | Admitting: Family Medicine

## 2022-08-30 DIAGNOSIS — E1129 Type 2 diabetes mellitus with other diabetic kidney complication: Secondary | ICD-10-CM

## 2022-09-08 ENCOUNTER — Other Ambulatory Visit: Payer: Self-pay | Admitting: Family Medicine

## 2022-09-08 DIAGNOSIS — E1129 Type 2 diabetes mellitus with other diabetic kidney complication: Secondary | ICD-10-CM

## 2022-09-15 ENCOUNTER — Ambulatory Visit (INDEPENDENT_AMBULATORY_CARE_PROVIDER_SITE_OTHER): Payer: Medicare HMO | Admitting: *Deleted

## 2022-09-15 DIAGNOSIS — Z794 Long term (current) use of insulin: Secondary | ICD-10-CM

## 2022-09-15 DIAGNOSIS — N182 Chronic kidney disease, stage 2 (mild): Secondary | ICD-10-CM

## 2022-09-15 NOTE — Chronic Care Management (AMB) (Signed)
Chronic Care Management   CCM RN Visit Note  09/15/2022 Name: Rick Lane MRN: [id number] DOB: [birth year]  Subjective: Rick Lane is a 72 y.o. year old male who is a primary care patient of Janora Norlander, DO. The patient was referred to the Chronic Care Management team for assistance with care management needs subsequent to provider initiation of CCM services and plan of care.    Today's Visit:  Engaged with patient by telephone for follow up visit.        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 Chronic Disease Management support and education needs related to CKD stage 2 No Advanced Directives in place- pt declines information Patient reports he does not check blood pressure and not interested in doing, patient states he does not drink sugary soft drinks, tries to eat healthy as much as he can, does not exercise Patient states he is doing well, reports has all medications and taking as prescribed  Planned Interventions: Evaluation of current treatment plan related to chronic kidney disease self management and patient's adherence to plan as established by provider      Provided education to patient re: stroke prevention, s/s of heart attack and stroke    Reviewed medications with patient and discussed importance of compliance    Counseled on the importance of exercise goals with target of 150 minutes per week     Advised patient, providing education and rationale, to monitor blood pressure daily and record, calling PCP for findings outside established parameters    Discussed complications of poorly controlled blood pressure such as heart disease, stroke, circulatory complications, vision complications, kidney impairment, sexual dysfunction    Reinforced importance of following low  sodium and heart healthy diet Reviewed upcoming scheduled appointments including pharmacist on 09/21/22 at 10 am   Symptom Management: Take medications as prescribed   Attend all scheduled provider appointments Call pharmacy for medication refills 3-7 days in advance of running out of medications Attend church or other social activities Perform all self care activities independently  Perform IADL's (shopping, preparing meals, housekeeping, managing finances) independently Call provider office for new concerns or questions  Drink adequate fluids- preferably water Try to get outside and walk daily Consider checking blood pressure Look over education sent via my chart- heart attack and stroke (prevention, signs/ symptoms, what to do) Pharmacist will contact you on 09/21/22 at 10 am  Follow Up Plan: Telephone follow up appointment with care management team member scheduled for:  12/08/22 at 1045 am       CCM (DIABETES) EXPECTED OUTCOME:  MONITOR, SELF-MANAGE AND REDUCE SYMPTOMS OF DIABETES       Current Barriers:  Knowledge Deficits related to management of Diabetes Chronic Disease Management support and education needs related to Diabetes, diet, exercise Non-adherence to prescribed medication regimen No Advanced Directives in place- pt declined  Patient reports he lives with spouse, is independent with all aspects of his care, continues to drive, does not exercise Patient reports he checks CBG QID with fasting ranges low 100's with reading of 102 today, random ranges 109-123 . Patient reports AIC up in October 2023 (9.5) due to had not been taking medication for diabetes because he ran out, pt is now working with Engineer, [employer],  states taking medications as prescribed, pt reports AIC is down to 6.4 and happy about this.  Planned Interventions: Reviewed medications with patient and discussed importance of medication adherence;        Discussed plans with patient for ongoing care management  follow up and provided patient with direct contact information for care management team;      Provided patient with written educational materials related to hypo and hyperglycemia and importance of correct treatment;       Advised patient, providing education and rationale, to check cbg twice daily and record        Review of patient status, including review of consultants reports, relevant laboratory and other test results, and medications completed;       Advised patient to discuss any issues with blood sugar, medications with provider;      Reinforced carbohydrate modified diet, food choices  Symptom Management: Take medications as prescribed   Attend all scheduled provider appointments Call pharmacy for medication refills 3-7 days in advance of running out of medications Attend church or other social activities Perform all self care activities independently  Perform IADL's (shopping, preparing meals, housekeeping, managing finances) independently Call provider office for new concerns or questions  check blood sugar at prescribed times: 4 times daily check feet daily for cuts, sores or redness enter blood sugar readings and medication or insulin into daily log take the blood sugar log to all doctor visits take the blood sugar meter to all doctor visits trim toenails straight across fill half of plate with vegetables limit fast food meals to no more than 1 per week manage portion size read food labels for fat, fiber, carbohydrates and portion size do heel pump exercise 2 to 3 times each day keep feet up while sitting wash and dry feet carefully every day Congratulations on decrease in [facility]- keep up the good work  Follow Up Plan: Telephone follow up appointment with care management team member scheduled for:  12/08/22 at 1045 am          Plan:Telephone follow up appointment with care management team member scheduled for:  12/08/22 at [place] am  [facility], BSN RN Case  Manager [place] [phone]

## 2022-09-15 NOTE — Patient Instructions (Signed)
Please call the care guide team at [phone] if you need to cancel or reschedule your appointment.   If you are experiencing a Mental Health or [place] or need someone to talk to, please call the Suicide and Crisis Lifeline: 988 call the [place] National Suicide Prevention Lifeline: (706)033-9826 or TTY: 301-100-8096 TTY (989)700-3152) to talk to a trained counselor call 1-800-273-TALK (toll free, 24 hour hotline) go to [facility] [address], [place] [phone]) call the [facility]: [phone] call 911   Following is a copy of the CCM Program Consent:  CCM service includes personalized support from designated clinical staff supervised by the physician, including individualized plan of care and coordination with other care providers 24/7 contact phone numbers for assistance for urgent and routine care needs. Service will only be billed when office clinical staff spend 20 minutes or more in a month to coordinate care. Only one practitioner may furnish and bill the service in a calendar month. The patient may stop CCM services at amy time (effective at the end of the month) by phone call to the office staff. 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 Chronic Disease Management support and education needs related to CKD stage 2 No Advanced Directives in place- pt declines information Patient reports he does not check blood pressure and not interested in doing, patient states he does not drink sugary soft drinks, tries to eat healthy as much as  he can, does not exercise Patient states he is doing well, reports has all medications and taking as prescribed  Planned Interventions: Evaluation of current treatment plan related to chronic kidney disease self management and patient's adherence to plan as established by provider      Provided education to patient re: stroke prevention, s/s of heart attack and stroke    Reviewed medications with patient and discussed importance of compliance    Counseled on the importance of exercise goals with target of 150 minutes per week     Advised patient, providing education and rationale, to monitor blood pressure daily and record, calling PCP for findings outside established parameters    Discussed complications of poorly controlled blood pressure such as heart disease, stroke, circulatory complications, vision complications, kidney impairment, sexual dysfunction    Reinforced importance of following low sodium and heart healthy diet Reviewed upcoming scheduled appointments including pharmacist on 09/21/22 at 10 am   Symptom Management: Take medications as prescribed   Attend all scheduled provider appointments Call pharmacy for medication refills 3-7 days in advance of running out of medications Attend church or other social activities Perform all self care activities independently  Perform IADL's (shopping, preparing meals, housekeeping, managing finances) independently Call provider office for new concerns or questions  Drink adequate fluids- preferably water Try to get outside and walk daily Consider checking blood pressure Look over education sent via my chart- heart attack and stroke (prevention, signs/ symptoms, what to do) Pharmacist will contact you on 09/21/22 at 10 am  Follow Up Plan: Telephone  follow up appointment with care management team member scheduled for:  12/08/22 at 72 am       CCM (DIABETES) EXPECTED OUTCOME:  MONITOR, SELF-MANAGE AND REDUCE SYMPTOMS OF DIABETES        Current Barriers:  Knowledge Deficits related to management of Diabetes Chronic Disease Management support and education needs related to Diabetes, diet, exercise Non-adherence to prescribed medication regimen No Advanced Directives in place- pt declined  Patient reports he lives with spouse, is independent with all aspects of his care, continues to drive, does not exercise Patient reports he checks CBG QID with fasting ranges low 100's with reading of 102 today, random ranges 109-123 . 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  Planned Interventions: Reviewed medications with patient and discussed importance of medication adherence;        Discussed plans with patient for ongoing care management follow up and provided patient with direct contact information for care management team;      Provided patient with written educational materials related to hypo and hyperglycemia and importance of correct treatment;       Advised patient, providing education and rationale, to check cbg twice daily and record        Review of patient status, including review of consultants reports, relevant laboratory and other test results, and medications completed;       Advised patient to discuss any issues with blood sugar, medications with provider;      Reinforced carbohydrate modified diet, food choices  Symptom Management: Take medications as prescribed   Attend all scheduled provider appointments Call pharmacy for medication refills 3-7 days in advance of running out of medications Attend church or other social activities Perform all self care activities independently  Perform IADL's (shopping, preparing meals, housekeeping, managing finances) independently Call provider office for new concerns or  questions  check blood sugar at prescribed times: 4 times daily check feet daily for cuts, sores or redness enter blood sugar readings and medication or insulin into daily log take the blood sugar log to all doctor visits take the blood sugar meter to all doctor visits trim toenails straight across fill half of plate with vegetables limit fast food meals to no more than 1 per week manage portion size read food labels for fat, fiber, carbohydrates and portion size do heel pump exercise 2 to 3 times each day keep feet up while sitting wash and dry feet carefully every day Congratulations on decrease in [facility]- keep up the good work  Follow Up Plan: Telephone follow up appointment with care management team member scheduled for:  12/08/22 at 1045 am          Patient verbalizes understanding of instructions and care plan provided today and agrees to view in [place]. Active MyChart status and patient understanding of how to access instructions and care plan via MyChart confirmed with patient.     Telephone follow up appointment with care management team member scheduled for:  12/08/22 at 27 am  Heart Attack - Warning Signs and What to Do A heart attack is a condition that occurs when your heart does not get enough oxygen. When this happens, the heart muscle begins to die. This can cause lasting damage if not treated right away. A heart attack is a  medical emergency. Be aware of the warning signs of a heart attack and act quickly. Risk Factors Your health care provider will talk with you about risk factors. These may include: Aging. Having a personal or family history of chest pain, heart attack, stroke, or narrowing of the arteries due to plaque buildup. Having taken chemotherapy or immune-suppressing medicines. Being male. Having a pregnancy history that includes pre-eclampsia. Being overweight or obese. Having any of these conditions: High blood pressure. High  cholesterol. Diabetes. Certain lifestyle choices may put you at a higher risk of a heart attack. These include: Drinking too much alcohol. Not getting regular exercise. Smoking or using tobacco products. Using street drugs. Warning signs or symptoms of a heart attack Heart damage can occur within the first few hours of a heart attack. This is why it is important to recognize early symptoms and get help right away. Warning signs and symptoms may include: Chest pain that may feel like: Crushing or squeezing. Tightness, pressure, fullness, or heaviness. Pain in the arm, neck, jaw, back, or upper body. Heartburn or upset stomach. Shortness of breath. Vomiting or nausea. Sudden sweating or clammy skin. Sudden light-headedness, dizziness, or passing out. Feeling tired (fatigue). Sudden feeling of anxiety. Fast or irregular heartbeat (palpitations). Heart attacks can be sudden and intense or can start slowly, with mild pain or discomfort. In some cases, heart attacks may happen with very mild symptoms or no symptoms at all (silent heart attacks). Silent heart attacks can be quickly associated with a heart attack, such as chest pain or pressure. However, there are other symptoms that may seem less serious or not related to a heart attack, but they actually are. These symptoms are called atypical symptoms. Be alert for the following atypical symptoms: Sharp pain in the side or chest that occurs with coughing or breathing. Shortness of breath or trouble breathing. Pain that spreads above the jaw or down into the body. Can symptoms differ between men and women? Heart attack symptoms can be different among men and women. Men may feel pain and numbness in the left side of their chest, while women feel it in the right side. Women may have signs and symptoms that may not be quickly associated with a heart attack. They may be more subtle and sometimes confusing. These  include: Nausea. Dizziness. Shortness of breath. Fatigue. Upper back pain that travels up the back, neck, and into the jaw. Where to find more information American Heart Association: heart.org Centers for Disease Control and Prevention: StoreMirror.com.cy Get help right away if: You have any combination of these symptoms: Severe chest discomfort, especially if the pain is crushing or pressure-like and spreads to your arms, back, neck, or jaw. Discomfort in the chest, neck, arm, jaw, or back (angina) that lasts for longer than 5 minutes and medicine does not help. Palpitations. Trouble breathing or shortness of breath. Sudden sweating or clammy skin. Nausea or vomiting. Unexplained tiredness or weakness. These symptoms may be an emergency. Get help right away. Call 911. Do not wait to see if the symptoms will go away. Do not drive yourself to the hospital. This information is not intended to replace advice given to you by your health care provider. Make sure you discuss any questions you have with your health care provider. Document Revised: 01/07/2022 Document Reviewed: 01/07/2022 Elsevier Patient Education  [place]. Stroke Prevention Some medical conditions and behaviors can lead to a higher chance of having a stroke. You can help prevent a stroke by  eating healthy, exercising, not smoking, and managing any medical conditions you have. Stroke is a leading cause of functional impairment. Primary prevention is particularly important because a majority of strokes are first-time events. Stroke changes the lives of not only those who experience a stroke but also their family and other caregivers. How can this condition affect me? A stroke is a medical emergency and should be treated right away. A stroke can lead to brain damage and can sometimes be life-threatening. If a person gets medical treatment right away, there is a better chance of surviving and recovering from a stroke. What can  increase my risk? The following medical conditions may increase your risk of a stroke: Cardiovascular disease. High blood pressure (hypertension). Diabetes. High cholesterol. Sickle cell disease. Blood clotting disorders (hypercoagulable state). Obesity. Sleep disorders (obstructive sleep apnea). Other risk factors include: Being older than age 20. Having a history of blood clots, stroke, or mini-stroke (transient ischemic attack, TIA). Genetic factors, such as race, ethnicity, or a family history of stroke. Smoking cigarettes or using other tobacco products. Taking birth control pills, especially if you also use tobacco. Heavy use of alcohol or drugs, especially cocaine and methamphetamine. Physical inactivity. What actions can I take to prevent this? Manage your health conditions High cholesterol levels. Eating a healthy diet is important for preventing high cholesterol. If cholesterol cannot be managed through diet alone, you may need to take medicines. Take any prescribed medicines to control your cholesterol as told by your health care provider. Hypertension. To reduce your risk of stroke, try to keep your blood pressure below 130/80. Eating a healthy diet and exercising regularly are important for controlling blood pressure. If these steps are not enough to manage your blood pressure, you may need to take medicines. Take any prescribed medicines to control hypertension as told by your health care provider. Ask your health care provider if you should monitor your blood pressure at home. Have your blood pressure checked every year, even if your blood pressure is normal. Blood pressure increases with age and some medical conditions. Diabetes. Eating a healthy diet and exercising regularly are important parts of managing your blood sugar (glucose). If your blood sugar cannot be managed through diet and exercise, you may need to take medicines. Take any prescribed medicines to control  your diabetes as told by your health care provider. Get evaluated for obstructive sleep apnea. Talk to your health care provider about getting a sleep evaluation if you snore a lot or have excessive sleepiness. Make sure that any other medical conditions you have, such as atrial fibrillation or atherosclerosis, are managed. Nutrition Follow instructions from your health care provider about what to eat or drink to help manage your health condition. These instructions may include: Reducing your daily calorie intake. Limiting how much salt (sodium) you use to 1,500 milligrams (mg) each day. Using only healthy fats for cooking, such as olive oil, canola oil, or sunflower oil. Eating healthy foods. You can do this by: Choosing foods that are high in fiber, such as whole grains, and fresh fruits and vegetables. Eating at least 5 servings of fruits and vegetables a day. Try to fill one-half of your plate with fruits and vegetables at each meal. Choosing lean protein foods, such as lean cuts of meat, poultry without skin, fish, tofu, beans, and nuts. Eating low-fat dairy products. Avoiding foods that are high in sodium. This can help lower blood pressure. Avoiding foods that have saturated fat, trans fat, and cholesterol. This  can help prevent high cholesterol. Avoiding processed and prepared foods. Counting your daily carbohydrate intake.  Lifestyle If you drink alcohol: Limit how much you have to: 0-1 drink a day for women who are not pregnant. 0-2 drinks a day for men. Know how much alcohol is in your drink. In the [place] one drink equals one 12 oz bottle of beer (312m), one 5 oz glass of wine (1474m, or one 1 oz glass of hard liquor (4445m Do not use any products that contain nicotine or tobacco. These products include cigarettes, chewing tobacco, and vaping devices, such as e-cigarettes. If you need help quitting, ask your health care provider. Avoid secondhand smoke. Do not use  drugs. Activity  Try to stay at a healthy weight. Get at least 30 minutes of exercise on most days, such as: Fast walking. Biking. Swimming. Medicines Take over-the-counter and prescription medicines only as told by your health care provider. Aspirin or blood thinners (antiplatelets or anticoagulants) may be recommended to reduce your risk of forming blood clots that can lead to stroke. Avoid taking birth control pills. Talk to your health care provider about the risks of taking birth control pills if: You are over 35 55ars old. You smoke. You get very bad headaches. You have had a blood clot. Where to find more information American Stroke Association: www.strokeassociation.org Get help right away if: You or a loved one has any symptoms of a stroke. "BE FAST" is an easy way to remember the main warning signs of a stroke: B - Balance. Signs are dizziness, sudden trouble walking, or loss of balance. E - Eyes. Signs are trouble seeing or a sudden change in vision. F - Face. Signs are sudden weakness or numbness of the face, or the face or eyelid drooping on one side. A - Arms. Signs are weakness or numbness in an arm. This happens suddenly and usually on one side of the body. S - Speech. Signs are sudden trouble speaking, slurred speech, or trouble understanding what people say. T - Time. Time to call emergency services. Write down what time symptoms started. You or a loved one has other signs of a stroke, such as: A sudden, severe headache with no known cause. Nausea or vomiting. Seizure. These symptoms may represent a serious problem that is an emergency. Do not wait to see if the symptoms will go away. Get medical help right away. Call your local emergency services (911 in the [place] Do not drive yourself to the hospital. Summary You can help to prevent a stroke by eating healthy, exercising, not smoking, limiting alcohol intake, and managing any medical conditions you may have. Do  not use any products that contain nicotine or tobacco. These include cigarettes, chewing tobacco, and vaping devices, such as e-cigarettes. If you need help quitting, ask your health care provider. Remember "BE FAST" for warning signs of a stroke. Get help right away if you or a loved one has any of these signs. This information is not intended to replace advice given to you by your health care provider. Make sure you discuss any questions you have with your health care provider. Document Revised: 02/11/2020 Document Reviewed: 02/11/2020 Elsevier Patient Education  [id number]

## 2022-09-21 ENCOUNTER — Encounter: Payer: Self-pay | Admitting: Pharmacist

## 2022-09-21 ENCOUNTER — Ambulatory Visit: Payer: Medicare HMO | Admitting: Pharmacist

## 2022-09-21 DIAGNOSIS — N182 Chronic kidney disease, stage 2 (mild): Secondary | ICD-10-CM

## 2022-09-21 DIAGNOSIS — G720 Drug-induced myopathy: Secondary | ICD-10-CM

## 2022-09-21 DIAGNOSIS — E1169 Type 2 diabetes mellitus with other specified complication: Secondary | ICD-10-CM

## 2022-09-21 NOTE — Progress Notes (Signed)
S:    72 y.o. male who presents for diabetes evaluation, education, and management. PMH is significant for T2DM, PAD, HLD, CKD, asthma, and BPH.  Patient last seen by Primary Care Provider, Dr. Lajuana Ripple, on 08/20/2022.   At last visit, A1c was 6.4% and no changes were made to regimen.   Current diabetes medications include: Tresiba 50 units daily, Novolog per sliding scale (8 units + 1 unit for every 25 mg/dL above 150 mg/dL) Current hypertension medications include: lisinopril 5 mg daily Current hyperlipidemia medications include: rosuvastatin 20 mg daily  Patient reports adherence to taking all medications as prescribed.  Insurance coverage: Aetna Medicare  Patient denies hypoglycemic events.  Reported home fasting blood sugars: slight increase over the past few days. Was typically running 110s, now up to 130s.  Reported 2 hour post-meal/random blood sugars: 138, 129, 118, 127.  Patient denies nocturia (nighttime urination).  Patient reports neuropathy (nerve pain). Some at night on L foot. Patient denies visual changes. Patient reports self foot exams.   BP: not checking, but willing to start.    O:   Lab Results  Component Value Date   HGBA1C 6.4 (H) 08/20/2022    Lipid Panel     Component Value Date/Time   CHOL 236 (H) 05/17/2022 1005   TRIG 234 (H) 05/17/2022 1005   HDL 37 (L) 05/17/2022 1005   CHOLHDL 6.4 (H) 05/17/2022 1005   LDLCALC 156 (H) 05/17/2022 1005   LDLDIRECT 84 06/27/2019 1339    Clinical Atherosclerotic Cardiovascular Disease (ASCVD): Yes - PAD The 10-year ASCVD risk score (Arnett DK, et al., 2019) is: 46.1%   Values used to calculate the score:     Age: 68 years     Sex: Male     Is Non-Hispanic African American: No     Diabetic: Yes     Tobacco smoker: No     Systolic Blood Pressure: [id number] mmHg     Is BP treated: Yes     HDL Cholesterol: 37 mg/dL     Total Cholesterol: 236 mg/dL    A/P: Diabetes longstanding  currently controlled  based on A1c (6.4%). Patient is able to verbalize appropriate hypoglycemia management plan. Medication adherence appears appropriate. -Continued basal insulin Tresiba (insulin degludec) 50 units daily.  -Continued rapid insulin Novolog (insulin aspart) per sliding scale -Patient educated on purpose, proper use, and potential adverse effects of insulin.  -Extensively discussed pathophysiology of diabetes, recommended lifestyle interventions, dietary effects on blood sugar control.  -Counseled on s/sx of and management of hypoglycemia.  -Next A1c anticipated 3-6 months.   ASCVD risk - secondary prevention in patient with diabetes. Last LDL is not at goal of <70 mg/dL (156 in October 2023). Questionable adherence to rosuvastatin as fill history shows it was last dispensed for a 90 day supply in August 2023. However, patient endorses adherence.  -Continued rosuvastatin 20 mg for now. Consider increasing to rosuvastatin 40 mg at follow up if patient is adherent and LDL is still not at goal.    Hypertension longstanding currently uncontrolled based on last OV BP. Blood pressure goal of <130/80 mmHg. Medication adherence reported. Patient not checking his BP at home but is agreeable to start.  -Continued lisinopril 5 mg. UACR 37 in October 2023.  Patient verbalized understanding of treatment plan.  Total time counseling 20 minutes.    Follow-up:  Pharmacist 1-3 months. PCP clinic visit in May 2024.   Joseph Art, Pharm.D. PGY-2 Ambulatory Care Pharmacy Resident  Almyra Free  Reyes Ivan, PharmD, BCACP Clinical Pharmacist, [facility]

## 2022-09-23 DIAGNOSIS — E1122 Type 2 diabetes mellitus with diabetic chronic kidney disease: Secondary | ICD-10-CM | POA: Diagnosis not present

## 2022-09-23 DIAGNOSIS — N182 Chronic kidney disease, stage 2 (mild): Secondary | ICD-10-CM | POA: Diagnosis not present

## 2022-09-23 DIAGNOSIS — Z794 Long term (current) use of insulin: Secondary | ICD-10-CM

## 2022-12-08 ENCOUNTER — Telehealth: Payer: Medicare HMO

## 2022-12-08 ENCOUNTER — Telehealth: Payer: Self-pay | Admitting: *Deleted

## 2022-12-08 NOTE — Telephone Encounter (Signed)
   CCM RN Visit Note   12/08/22 Name: Rick Lane MRN: [id number]      DOB: 11-27-1950  Subjective: Rick Lane is a 72 y.o. year old male who is a primary care patient of Delynn Flavin DO. The patient was referred to the Chronic Care Management team for assistance with care management needs subsequent to provider initiation of CCM services and plan of care.      An unsuccessful telephone outreach was attempted today to contact the patient about Chronic Care Management needs.    Plan:Telephone follow up appointment with care management team member scheduled for:  upon care guide rescheduling.  Irving Shows RNC, BSN RN Case Manager [facility] [phone]

## 2022-12-14 ENCOUNTER — Telehealth: Payer: Self-pay

## 2022-12-14 NOTE — Progress Notes (Signed)
  Chronic Care Management Note  12/14/2022 Name: Rick Lane MRN: [id number] DOB: [birth year]  [name] is a 72 y.o. year old male who is a primary care patient of [name], DO and is actively engaged with the Chronic Care Management team. I reached out to [name] by phone today to assist with re-scheduling a follow up visit with the RN Case Manager  Follow up plan: Unsuccessful telephone outreach attempt made. A HIPAA compliant phone message was left for the patient providing contact information and requesting a return call.  The care management team will reach out to the patient again over the next 7 days.  If patient returns call to provider office, please advise to call CCM Care Guide [name]  at [phone]  [name], RMA Care Guide [facility]  [place], [place] [address] Direct Dial: [phone] [email or web address]

## 2022-12-27 NOTE — Progress Notes (Signed)
  Chronic Care Management Note  12/27/2022 Name: Rick Lane MRN: [id number] DOB: Dec 14, 1950  Rick Lane is a 72 y.o. year old male who is a primary care patient of [name], DO and is actively engaged with the Chronic Care Management team. I reached out to [name] by phone today to assist with re-scheduling a follow up visit with the RN Case Manager  Follow up plan: Unsuccessful telephone outreach attempt made. A HIPAA compliant phone message was left for the patient providing contact information and requesting a return call.  The care management team will reach out to the patient again over the next 7 days.  If patient returns call to provider office, please advise to call CCM Care Guide [name]  at [phone]  [name], RMA Care Guide [facility]  [place], [place] [address] Direct Dial: [phone] [email or web address]

## 2023-01-25 ENCOUNTER — Ambulatory Visit: Payer: Self-pay | Admitting: *Deleted

## 2023-01-25 DIAGNOSIS — N182 Chronic kidney disease, stage 2 (mild): Secondary | ICD-10-CM

## 2023-01-25 DIAGNOSIS — E1129 Type 2 diabetes mellitus with other diabetic kidney complication: Secondary | ICD-10-CM

## 2023-01-25 NOTE — Chronic Care Management (AMB) (Signed)
   01/25/2023  [name] 02/22/1951 [id number]   Message received from care guide- unable to maintain contact, case closure.  Irving Shows RNC, BSN RN Case Manager [facility] [phone]

## 2023-01-25 NOTE — Progress Notes (Signed)
  Chronic Care Management Note  01/25/2023 Name: Rick Lane MRN: [id number] DOB: [birth year]  Rick Lane is a 72 y.o. year old male who is a primary care patient of [name], DO and is actively engaged with the Chronic Care Management team. I reached out to [name] by phone today to assist with re-scheduling a follow up visit with the RN Case Manager  Follow up plan: Unable to make contact on outreach attempts x 3. PCP [name], DO notified via routed documentation in medical record.   [name], RMA Care Guide [facility]  [place], [place] [address] Direct Dial: [phone] [email or web address]

## 2023-02-02 ENCOUNTER — Other Ambulatory Visit: Payer: Self-pay | Admitting: Family Medicine

## 2023-02-02 DIAGNOSIS — E1129 Type 2 diabetes mellitus with other diabetic kidney complication: Secondary | ICD-10-CM

## 2023-03-04 ENCOUNTER — Other Ambulatory Visit: Payer: Self-pay | Admitting: Family Medicine

## 2023-03-04 DIAGNOSIS — R809 Proteinuria, unspecified: Secondary | ICD-10-CM

## 2023-03-15 ENCOUNTER — Other Ambulatory Visit: Payer: Self-pay | Admitting: Family Medicine

## 2023-03-15 DIAGNOSIS — E1129 Type 2 diabetes mellitus with other diabetic kidney complication: Secondary | ICD-10-CM

## 2023-03-22 ENCOUNTER — Encounter: Payer: Self-pay | Admitting: Family Medicine

## 2023-03-22 ENCOUNTER — Ambulatory Visit (INDEPENDENT_AMBULATORY_CARE_PROVIDER_SITE_OTHER): Payer: Medicare HMO | Admitting: Family Medicine

## 2023-03-22 ENCOUNTER — Ambulatory Visit (INDEPENDENT_AMBULATORY_CARE_PROVIDER_SITE_OTHER): Payer: Medicare HMO

## 2023-03-22 VITALS — BP 108/65 | HR 89 | Temp 98.7°F | Ht 74.0 in | Wt 275.0 lb

## 2023-03-22 DIAGNOSIS — E1169 Type 2 diabetes mellitus with other specified complication: Secondary | ICD-10-CM

## 2023-03-22 DIAGNOSIS — Z9189 Other specified personal risk factors, not elsewhere classified: Secondary | ICD-10-CM

## 2023-03-22 DIAGNOSIS — R5381 Other malaise: Secondary | ICD-10-CM

## 2023-03-22 DIAGNOSIS — Z794 Long term (current) use of insulin: Secondary | ICD-10-CM | POA: Diagnosis not present

## 2023-03-22 DIAGNOSIS — E1159 Type 2 diabetes mellitus with other circulatory complications: Secondary | ICD-10-CM

## 2023-03-22 DIAGNOSIS — R809 Proteinuria, unspecified: Secondary | ICD-10-CM

## 2023-03-22 DIAGNOSIS — E1129 Type 2 diabetes mellitus with other diabetic kidney complication: Secondary | ICD-10-CM | POA: Diagnosis not present

## 2023-03-22 DIAGNOSIS — E785 Hyperlipidemia, unspecified: Secondary | ICD-10-CM

## 2023-03-22 DIAGNOSIS — R21 Rash and other nonspecific skin eruption: Secondary | ICD-10-CM | POA: Diagnosis not present

## 2023-03-22 DIAGNOSIS — D229 Melanocytic nevi, unspecified: Secondary | ICD-10-CM | POA: Diagnosis not present

## 2023-03-22 DIAGNOSIS — Z89511 Acquired absence of right leg below knee: Secondary | ICD-10-CM

## 2023-03-22 DIAGNOSIS — R2689 Other abnormalities of gait and mobility: Secondary | ICD-10-CM | POA: Diagnosis not present

## 2023-03-22 LAB — HM DIABETES EYE EXAM

## 2023-03-22 LAB — BAYER DCA HB A1C WAIVED: HB A1C (BAYER DCA - WAIVED): 6.2 % — ABNORMAL HIGH (ref 4.8–5.6)

## 2023-03-22 NOTE — Progress Notes (Signed)
Rick Lane arrived 03/22/2023 and has given verbal consent to obtain images and complete their overdue diabetic retinal screening.  The images have been sent to an ophthalmologist or optometrist for review and interpretation.  Results will be sent back to [name], DO for review.  Patient has been informed they will be contacted when we receive the results via telephone or MyChart

## 2023-03-22 NOTE — Progress Notes (Signed)
Subjective: CC:DM PCP: [name], DO JYN:[id number] Bossie is a 72 y.o. male presenting to clinic today for:  1. Type 2 Diabetes with hypertension, hyperlipidemia:  Using fingerstick for glucose monitoring.  Gets [employer] brand over-the-counter.  Does not report any hypoglycemic episodes and in fact on average his blood sugars have been running in the low 120s to 140s.  This is slightly higher than his normal blood sugars however.  He continues to use insulin as prescribed.  Compliant with blood pressure and cholesterol medicine but does not always take his Wellbutrin as directed according to his wife.  Diabetes Health Maintenance Due  Topic Date Due   OPHTHALMOLOGY EXAM  03/11/2022   HEMOGLOBIN A1C  02/18/2023   FOOT EXAM  08/21/2023    Last A1c:  Lab Results  Component Value Date   HGBA1C 6.4 (H) 08/20/2022    2.  Balance issues Patient does report some balance issues.  His wife comments that he is primarily sedentary.  He does not have any dizziness, lightheadedness or weakness but just feels like he is off balance.  He has history of BKA on the right.  He saw his prosthesis specialist recently and they recommended new liner.  He sees [facility] for this.  Asking for a new wheelchair to have at home.  Currently relying on a cane but again still has balance issues with this  3.  Rash They report a random rash that occurs every few months on his upper extremities and lower extremities.  It recently resolved.  He denies it being itchy.  It looks red and raised according to his wife.  No known tick bites.  Not associated with any types of foods or activities.  Used to be under the care of a dermatologist in [place] but cannot recall his name.   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 Equate Multivitamin for Men 50+, Disp: , Rfl:    NOVOLOG FLEXPEN 100 UNIT/ML FlexPen, Inject 10 Units into the skin 2 (two) times daily with breakfast and lunch AND 8-9 Units daily with supper., Disp: 15 mL, Rfl: 12   rosuvastatin (CRESTOR) 20 MG tablet, Take 1 tablet by mouth once daily, Disp: 90 tablet, Rfl: 0   TRESIBA FLEXTOUCH 200 UNIT/ML FlexTouch Pen, INJECT 60 UNITS SUBCUTANEOUSLY AT BEDTIME, Disp: 9 mL, Rfl: 0 Social History   Socioeconomic History   Marital status: Married    Spouse name: Not on file   Number of children: 0   Years of education: 86  Highest education level: Not on file  Occupational History   Occupation: Magazine features editor    Comment: [employer]   Occupation: retired  Tobacco Use   Smoking status: Never   Smokeless tobacco: Never  Vaping Use   Vaping status: Never Used  Substance and Sexual Activity   Alcohol use: No   Drug use: No   Sexual activity: Not Currently    Birth control/protection: None  Other Topics Concern   Not on file  Social History Narrative   Not on file   Social Determinants of Health   Financial Resource Strain: Low Risk  (07/29/2022)   Overall Financial Resource Strain (CARDIA)    Difficulty of Paying Living Expenses: Not hard at all  Food Insecurity: No Food Insecurity (07/29/2022)   Hunger Vital  Sign    Worried About Running Out of Food in the Last Year: Never true    Ran Out of Food in the Last Year: Never true  Transportation Needs: No Transportation Needs (07/29/2022)   PRAPARE - Administrator, Civil Service (Medical): No    Lack of Transportation (Non-Medical): No  Physical Activity: Inactive (07/29/2022)   Exercise Vital Sign    Days of Exercise per Week: 0 days    Minutes of Exercise per Session: 0 min  Stress: No Stress Concern Present (07/29/2022)   [employer] of Occupational Health - Occupational Stress Questionnaire    Feeling of Stress : Not at all  Social Connections: Socially Integrated (07/29/2022)   Social Connection and Isolation Panel [NHANES]    Frequency of Communication with Friends and Family: More than three times a week    Frequency of Social Gatherings with Friends and Family: More than three times a week    Attends Religious Services: More than 4 times per year    Active Member of [employer] or Organizations: Yes    Attends Engineer, structural: More than 4 times per year    Marital Status: Married  Catering manager Violence: Not At Risk (07/29/2022)   Humiliation, Afraid, Rape, and Kick questionnaire    Fear of Current or Ex-Partner: No    Emotionally Abused: No    Physically Abused: No    Sexually Abused: No   Family History  Problem Relation Age of Onset   Cirrhosis Mother        secondary to alcohol abuse.    Heart disease Father    Diabetes Sister    Diabetes Brother    Heart attack Paternal Grandfather    Colon cancer Maternal Grandfather        passed in the mid-60's.    Objective: Office vital signs reviewed. BP 108/65   Pulse 89   Temp 98.7 F (37.1 C)   Ht 6\' 2"  (1.88 m)   Wt 275 lb (124.7 kg)   SpO2 95%   BMI 35.31 kg/m   Physical Examination:  General: Awake, alert, well nourished, No acute distress HEENT: sclera white, MMM Cardio: regular rate and rhythm, S1S2 heard, no murmurs appreciated Pulm: clear  to auscultation bilaterally, no wheezes, rhonchi or rales; normal work of breathing on room air GI: Protuberant abdomen MSK: Ambulating with use of cane.  Right BKA with prosthesis in place.   Assessment/ Plan: 72 y.o. male   Type 2 diabetes mellitus with microalbuminuria, with long-term current use of insulin (HCC) - Plan: Microalbumin / creatinine urine ratio, Basic Metabolic Panel, Bayer DCA Hb A1c Waived  Hyperlipidemia associated with type 2 diabetes mellitus (HCC)  S/P unilateral BKA (below knee amputation), right (HCC) - Plan: For home use only DME standard manual wheelchair with seat cushion  Balance problem - Plan: For home use only DME standard manual wheelchair with seat cushion  Sedentary lifestyle  Physical deconditioning - Plan: For home use only DME standard manual wheelchair with seat cushion  Rash and nonspecific skin eruption - Plan: Ambulatory referral to Dermatology  Multiple pigmented nevi - Plan: Ambulatory referral to Dermatology   Sugar well-controlled with A1c of 6.2.  Urine microalbumin collected today.  Diabetic eye exam was performed in office today  Not due for fasting lipid until October.  Continue statin  DME for wheelchair sent to [employer].  I attempted to reach out to his specialist, Thayer Ohm, at [facility] but was unsuccessful in reaching anyone at their office.  I went ahead and faxed to generic prescription for prosthesis supplies to their office and asked that they send me orders if there is any specific cosignature as needed.  Encouraged physical activity, worry that he in fact is having core weakening and this is what is causing the balance issues.  I offered referral to formal physical therapy but he wanted to see how he did on his own.  Referral to dermatology for full-body exam.  There is no active rash but he has multiple pigmented nevi that likely would benefit from further evaluation  Juan Kissoon Hulen Skains, DO [facility]  Family Medicine [phone]

## 2023-03-22 NOTE — Patient Instructions (Signed)

## 2023-03-23 LAB — MICROALBUMIN / CREATININE URINE RATIO
Creatinine, Urine: 93 mg/dL
Microalb/Creat Ratio: 9 mg/g{creat} (ref 0–29)
Microalbumin, Urine: 8.1 ug/mL

## 2023-03-23 LAB — BASIC METABOLIC PANEL
BUN/Creatinine Ratio: 18 (ref 10–24)
BUN: 23 mg/dL (ref 8–27)
CO2: 22 mmol/L (ref 20–29)
Calcium: 10.2 mg/dL (ref 8.6–10.2)
Chloride: 105 mmol/L (ref 96–106)
Creatinine, Ser: 1.27 mg/dL (ref 0.76–1.27)
Glucose: 150 mg/dL — ABNORMAL HIGH (ref 70–99)
Potassium: 4.7 mmol/L (ref 3.5–5.2)
Sodium: 141 mmol/L (ref 134–144)
eGFR: 60 mL/min/{1.73_m2} (ref >59)

## 2023-04-03 ENCOUNTER — Other Ambulatory Visit: Payer: Self-pay | Admitting: Family Medicine

## 2023-04-03 DIAGNOSIS — E1129 Type 2 diabetes mellitus with other diabetic kidney complication: Secondary | ICD-10-CM

## 2023-04-12 ENCOUNTER — Telehealth: Payer: Self-pay | Admitting: Family Medicine

## 2023-04-12 NOTE — Telephone Encounter (Signed)
This was sent by Dr. Reece Agar at last visit.

## 2023-04-13 NOTE — Telephone Encounter (Signed)
This has been refaxed 

## 2023-06-20 ENCOUNTER — Other Ambulatory Visit: Payer: Self-pay | Admitting: Family Medicine

## 2023-06-20 DIAGNOSIS — E1129 Type 2 diabetes mellitus with other diabetic kidney complication: Secondary | ICD-10-CM

## 2023-07-21 ENCOUNTER — Other Ambulatory Visit: Payer: Self-pay | Admitting: Family Medicine

## 2023-07-21 DIAGNOSIS — R809 Proteinuria, unspecified: Secondary | ICD-10-CM

## 2023-07-22 ENCOUNTER — Telehealth: Payer: Self-pay | Admitting: Pharmacist

## 2023-07-22 NOTE — Telephone Encounter (Signed)
   This patient is appearing on a report for being at risk of failing the adherence measure for cholesterol (statin) and hypertension (ACEi/ARB) medications this calendar year.   Medication: rosuvastatin/lisinopril Last fill date: no fill history  Left voicemail for patient to return my call at their convenience.   Kieth Brightly, PharmD, BCACP, CPP Clinical Pharmacist, [facility]

## 2023-07-25 ENCOUNTER — Ambulatory Visit (INDEPENDENT_AMBULATORY_CARE_PROVIDER_SITE_OTHER): Payer: Medicare HMO | Admitting: Family Medicine

## 2023-07-25 ENCOUNTER — Encounter: Payer: Self-pay | Admitting: Family Medicine

## 2023-07-25 VITALS — BP 181/79 | HR 72 | Temp 98.8°F | Ht 74.0 in | Wt 286.0 lb

## 2023-07-25 DIAGNOSIS — E1151 Type 2 diabetes mellitus with diabetic peripheral angiopathy without gangrene: Secondary | ICD-10-CM | POA: Diagnosis not present

## 2023-07-25 DIAGNOSIS — E1129 Type 2 diabetes mellitus with other diabetic kidney complication: Secondary | ICD-10-CM | POA: Diagnosis not present

## 2023-07-25 DIAGNOSIS — R809 Proteinuria, unspecified: Secondary | ICD-10-CM

## 2023-07-25 DIAGNOSIS — E1122 Type 2 diabetes mellitus with diabetic chronic kidney disease: Secondary | ICD-10-CM

## 2023-07-25 DIAGNOSIS — Z794 Long term (current) use of insulin: Secondary | ICD-10-CM | POA: Diagnosis not present

## 2023-07-25 DIAGNOSIS — E1169 Type 2 diabetes mellitus with other specified complication: Secondary | ICD-10-CM

## 2023-07-25 DIAGNOSIS — Z89511 Acquired absence of right leg below knee: Secondary | ICD-10-CM | POA: Diagnosis not present

## 2023-07-25 DIAGNOSIS — E785 Hyperlipidemia, unspecified: Secondary | ICD-10-CM

## 2023-07-25 DIAGNOSIS — I739 Peripheral vascular disease, unspecified: Secondary | ICD-10-CM | POA: Diagnosis not present

## 2023-07-25 DIAGNOSIS — N182 Chronic kidney disease, stage 2 (mild): Secondary | ICD-10-CM

## 2023-07-25 DIAGNOSIS — R3912 Poor urinary stream: Secondary | ICD-10-CM

## 2023-07-25 LAB — BAYER DCA HB A1C WAIVED: HB A1C (BAYER DCA - WAIVED): 6.7 % — ABNORMAL HIGH (ref 4.8–5.6)

## 2023-07-25 NOTE — Progress Notes (Signed)
Subjective: CC:DM PCP: [name], DO ZOX:[id number] Rick Lane is a 72 y.o. male presenting to clinic today for:  1. Type 2 Diabetes with hypertension, hyperlipidemia and CKD2 and history of BKA on the right:  Compliant with medications.  Reports no hypoglycemic episodes since last visit.  Typically his blood sugars are running between 140 and 150.  He did not take his antihypertensive or cholesterol medicine this morning because he was coming in fasting.  Diabetes Health Maintenance Due  Topic Date Due   FOOT EXAM  08/21/2023   HEMOGLOBIN A1C  09/22/2023   OPHTHALMOLOGY EXAM  03/21/2024    Last A1c:  Lab Results  Component Value Date   HGBA1C 6.2 (H) 03/22/2023    ROS: Denies chest pain, shortness of breath, dizziness or falls since last visit.  He still has not gotten any supplies for his prosthetic on the right.  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 Equate Multivitamin for Men 50+, Disp: , Rfl:    NOVOLOG FLEXPEN 100 UNIT/ML FlexPen, Inject 10 Units into the skin 2 (two)  times daily with breakfast and lunch AND 8-9 Units daily with supper., Disp: 15 mL, Rfl: 12   rosuvastatin (CRESTOR) 20 MG tablet, Take 1 tablet by mouth once daily, Disp: 90 tablet, Rfl: 0   TRESIBA FLEXTOUCH 200 UNIT/ML FlexTouch Pen, INJECT 60 UNITS SUBCUTANEOUSLY AT BEDTIME, Disp: 9 mL, Rfl: 0 Social History   Socioeconomic History   Marital status: Married    Spouse name: Not on file   Number of children: 0   Years of education: 14   Highest education level: Not on file  Occupational History   Occupation: Magazine features editor    Comment: Lawyer   Occupation: retired  Tobacco Use   Smoking status: Never   Smokeless tobacco: Never  Vaping Use   Vaping status: Never Used  Substance and Sexual Activity   Alcohol use: No   Drug use: No   Sexual activity: Not Currently    Birth control/protection: None  Other Topics Concern   Not on file  Social History Narrative   Not on file   Social Drivers of Health   Financial Resource Strain: Low Risk  (07/29/2022)   Overall Financial Resource Strain (CARDIA)    Difficulty of Paying Living Expenses: Not hard at all  Food Insecurity: No Food Insecurity (07/29/2022)   Hunger Vital Sign    Worried About Running Out of Food in the Last Year: Never  true    Ran Out of Food in the Last Year: Never true  Transportation Needs: No Transportation Needs (07/29/2022)   PRAPARE - Administrator, Civil Service (Medical): No    Lack of Transportation (Non-Medical): No  Physical Activity: Inactive (07/29/2022)   Exercise Vital Sign    Days of Exercise per Week: 0 days    Minutes of Exercise per Session: 0 min  Stress: No Stress Concern Present (07/29/2022)   [employer] of Occupational Health - Occupational Stress Questionnaire    Feeling of Stress : Not at all  Social Connections: Socially Integrated (07/29/2022)   Social Connection and Isolation Panel [NHANES]    Frequency of Communication with Friends and Family: More than three times a week     Frequency of Social Gatherings with Friends and Family: More than three times a week    Attends Religious Services: More than 4 times per year    Active Member of [employer] or Organizations: Yes    Attends Engineer, structural: More than 4 times per year    Marital Status: Married  Catering manager Violence: Not At Risk (07/29/2022)   Humiliation, Afraid, Rape, and Kick questionnaire    Fear of Current or Ex-Partner: No    Emotionally Abused: No    Physically Abused: No    Sexually Abused: No   Family History  Problem Relation Age of Onset   Cirrhosis Mother        secondary to alcohol abuse.    Heart disease Father    Diabetes Sister    Diabetes Brother    Heart attack Paternal Grandfather    Colon cancer Maternal Grandfather        passed in the mid-60's.    Objective: Office vital signs reviewed. BP (!) 181/79   Pulse 72   Temp 98.8 F (37.1 C)   Ht 6\' 2"  (1.88 m)   Wt 286 lb (129.7 kg)   SpO2 97%   BMI 36.72 kg/m   Physical Examination:  General: Awake, alert, obese, nontoxic-appearing male, No acute distress HEENT: Sclera white.  Moist mucous membranes Cardio: regular rate and rhythm, S1S2 heard, no murmurs appreciated Pulm: clear to auscultation bilaterally, no wheezes, rhonchi or rales; normal work of breathing on room air MSK: Ambulating with use of cane.  Has right BKA with prosthetic in place  Assessment/ Plan: 72 y.o. male   Type 2 diabetes mellitus with microalbuminuria, with long-term current use of insulin (HCC) - Plan: CMP14+EGFR, Lipid Panel, Bayer DCA Hb A1c Waived  Hyperlipidemia associated with type 2 diabetes mellitus (HCC) - Plan: CMP14+EGFR, Lipid Panel  CKD (chronic kidney disease) stage 2, GFR 60-89 ml/min - Plan: CMP14+EGFR  PAD (peripheral artery disease) (HCC) - Plan: CMP14+EGFR  Weak urinary stream - Plan: PSA  S/P unilateral BKA (below knee amputation), right (HCC) - Plan: For home use only DME Other see comment  Despite  rise in home blood sugars his A1c remains controlled at 6.7 today.  I think that given absence of hypoglycemia, no need to adjust medications at this time.  Renal function, fasting lipid panel collected.  He will continue all medications as prescribed  Screening PSA collected that we did not focus much of our attention a weak urinary stream.  I personally reached out to his specialist at [facility], Thayer Ohm, who asked that I go ahead and replace order and he will be able to retrieve this from our epic system.  They will let me  know if there are any other issues with the Rx for prosthetic supplies.  I also talked to Olegario Messier, who will reach out to [facility] with regards to the wheelchair that was also ordered back in August  Redell Bhandari M Audon Heymann, DO [facility] [phone]

## 2023-07-26 ENCOUNTER — Other Ambulatory Visit: Payer: Self-pay | Admitting: Family Medicine

## 2023-07-26 DIAGNOSIS — E1129 Type 2 diabetes mellitus with other diabetic kidney complication: Secondary | ICD-10-CM

## 2023-07-26 DIAGNOSIS — F32A Depression, unspecified: Secondary | ICD-10-CM

## 2023-07-26 DIAGNOSIS — R809 Proteinuria, unspecified: Secondary | ICD-10-CM

## 2023-07-26 LAB — LIPID PANEL
Chol/HDL Ratio: 5.7 {ratio} — ABNORMAL HIGH (ref 0.0–5.0)
Cholesterol, Total: 247 mg/dL — ABNORMAL HIGH (ref 100–199)
HDL: 43 mg/dL (ref >39)
LDL Chol Calc (NIH): 177 mg/dL — ABNORMAL HIGH (ref 0–99)
Triglycerides: 146 mg/dL (ref 0–149)
VLDL Cholesterol Cal: 27 mg/dL (ref 5–40)

## 2023-07-26 LAB — CMP14+EGFR
ALT: 43 [IU]/L (ref 0–44)
AST: 26 [IU]/L (ref 0–40)
Albumin: 4.5 g/dL (ref 3.8–4.8)
Alkaline Phosphatase: 42 [IU]/L — ABNORMAL LOW (ref 44–121)
BUN/Creatinine Ratio: 24 (ref 10–24)
BUN: 25 mg/dL (ref 8–27)
Bilirubin Total: 0.4 mg/dL (ref 0.0–1.2)
CO2: 23 mmol/L (ref 20–29)
Calcium: 10.1 mg/dL (ref 8.6–10.2)
Chloride: 102 mmol/L (ref 96–106)
Creatinine, Ser: 1.04 mg/dL (ref 0.76–1.27)
Globulin, Total: 1.9 g/dL (ref 1.5–4.5)
Glucose: 137 mg/dL — ABNORMAL HIGH (ref 70–99)
Potassium: 4.7 mmol/L (ref 3.5–5.2)
Sodium: 139 mmol/L (ref 134–144)
Total Protein: 6.4 g/dL (ref 6.0–8.5)
eGFR: 77 mL/min/{1.73_m2} (ref >59)

## 2023-07-26 LAB — PSA: Prostate Specific Ag, Serum: 0.5 ng/mL (ref 0.0–4.0)

## 2023-07-26 MED ORDER — TRESIBA FLEXTOUCH 200 UNIT/ML ~~LOC~~ SOPN: 60.0000 [IU] | PEN_INJECTOR | Freq: Every day | SUBCUTANEOUS | 3 refills | Status: DC

## 2023-07-26 MED ORDER — LISINOPRIL 5 MG PO TABS: 5.0000 mg | ORAL_TABLET | Freq: Every day | ORAL | 3 refills | Status: DC

## 2023-07-26 MED ORDER — ROSUVASTATIN CALCIUM 20 MG PO TABS: 20.0000 mg | ORAL_TABLET | Freq: Every day | ORAL | 3 refills | Status: DC

## 2023-07-26 MED ORDER — BUPROPION HCL ER (XL) 150 MG PO TB24: 150.0000 mg | ORAL_TABLET | Freq: Every day | ORAL | 3 refills | Status: DC

## 2023-07-26 MED ORDER — NOVOLOG FLEXPEN 100 UNIT/ML ~~LOC~~ SOPN: PEN_INJECTOR | SUBCUTANEOUS | 12 refills | Status: DC

## 2023-08-02 ENCOUNTER — Ambulatory Visit: Payer: Medicare HMO

## 2023-08-02 VITALS — Ht 74.0 in | Wt 286.0 lb

## 2023-08-02 DIAGNOSIS — Z0000 Encounter for general adult medical examination without abnormal findings: Secondary | ICD-10-CM

## 2023-08-02 NOTE — Patient Instructions (Signed)
 Rick Lane , Thank you for taking time to come for your Medicare Wellness Visit. I appreciate your ongoing commitment to your health goals. Please review the following plan we discussed and let me know if I can assist you in the future.   Referrals/Orders/Follow-Ups/Clinician Recommendations: Aim for 30 minutes of exercise or brisk walking, 6-8 glasses of water, and 5 servings of fruits and vegetables each day.  This is a list of the screening recommended for you and due dates:  Health Maintenance  Topic Date Due   COVID-19 Vaccine (1 - 2024-25 season) 08/10/2023*   Pneumonia Vaccine (1 of 2 - PCV) 08/21/2023*   Flu Shot  10/24/2023*   Complete foot exam   08/21/2023   Hemoglobin A1C  01/23/2024   Yearly kidney health urinalysis for diabetes  03/21/2024   Eye exam for diabetics  03/21/2024   Yearly kidney function blood test for diabetes  07/24/2024   Medicare Annual Wellness Visit  08/01/2024   Colon Cancer Screening  12/18/2029   Hepatitis C Screening  Completed   Zoster (Shingles) Vaccine  Completed   HPV Vaccine  Aged Out   DTaP/Tdap/Td vaccine  Discontinued  *Topic was postponed. The date shown is not the original due date.    Advanced directives: (ACP Link)Information on Advanced Care Planning can be found at [place]  Secretary of [facility] Advance Health Care Directives Advance Health Care Directives ([email or web address]   Next Medicare Annual Wellness Visit scheduled for next year: Yes

## 2023-08-02 NOTE — Progress Notes (Signed)
 Subjective:   Rick Lane is a 73 y.o. male who presents for Medicare Annual/Subsequent preventive examination.  Visit Complete: Virtual I connected with  Charlie Sauer on 08/02/23 by a audio enabled telemedicine application and verified that I am speaking with the correct person using two identifiers.  Patient Location: Home  Provider Location: Home Office  I discussed the limitations of evaluation and management by telemedicine. The patient expressed understanding and agreed to proceed.  Vital Signs: Because this visit was a virtual/telehealth visit, some criteria may be missing or patient reported. Any vitals not documented were not able to be obtained and vitals that have been documented are patient reported.  Cardiac Risk Factors include: advanced age (>59men, >58 women);diabetes mellitus;dyslipidemia;hypertension;male gender     Objective:    Today's Vitals   08/02/23 0843  Weight: 286 lb (129.7 kg)  Height: 6' 2 (1.88 m)   Body mass index is 36.72 kg/m.     08/02/2023    9:18 AM 07/29/2022    2:18 PM 06/01/2022   10:23 AM 07/22/2021    1:27 PM 12/19/2019   10:38 AM 10/02/2018    7:23 AM 09/25/2018    1:40 PM  Advanced Directives  Does Patient Have a Medical Advance Directive? No No No No No No No  Would patient like information on creating a medical advance directive? Yes (MAU/Ambulatory/Procedural Areas - Information given) No - Patient declined No - Patient declined No - Patient declined No - Patient declined No - Patient declined No - Patient declined    Current Medications (verified) Outpatient Encounter Medications as of 08/02/2023  Medication Sig   buPROPion  (WELLBUTRIN  XL) 150 MG 24 hr tablet Take 1 tablet (150 mg total) by mouth daily.   cholecalciferol (VITAMIN D3) 25 MCG (1000 UNIT) tablet Take 1,000 Units by mouth daily.   lisinopril  (ZESTRIL ) 5 MG tablet Take 1 tablet (5 mg total) by mouth daily.   Multiple Vitamin (MULTIVITAMIN WITH MINERALS) TABS tablet  Take 1 tablet by mouth daily. Equate Multivitamin for Men 50+   NOVOLOG  FLEXPEN 100 UNIT/ML FlexPen Inject 10 Units into the skin 2 (two) times daily with breakfast and lunch AND 8-9 Units daily with supper.   rosuvastatin  (CRESTOR ) 20 MG tablet Take 1 tablet (20 mg total) by mouth daily.   TRESIBA  FLEXTOUCH 200 UNIT/ML FlexTouch Pen Inject 60 Units into the skin at bedtime.   No facility-administered encounter medications on file as of 08/02/2023.    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) 11/02/2016   Depression    Diabetes mellitus without complication (HCC)    Type II   Hyperlipidemia    Neuropathy    Neuropathy    Sleep apnea    Past Surgical History:  Procedure Laterality Date   AMPUTATION Right 12/12/2015   Procedure: Right Transmetatarsal Amputation With Prevena VAC Placement;  Surgeon: Jerona Harden GAILS, MD;  [facility];  Service: Orthopedics;  Laterality: Right;   AMPUTATION Right 03/05/2016   Procedure: RIGHT BELOW KNEE AMPUTATION;  Surgeon: Jerona GAILS Harden, MD;  Location: [facility] OR;  Service: Orthopedics;  Laterality: Right;   CATARACT EXTRACTION W/PHACO Right 10/02/2018   Procedure: CATARACT EXTRACTION PHACO AND INTRAOCULAR LENS PLACEMENT (IOC);  Surgeon: Harrie Agent, MD;  Location: [facility];  Service: Ophthalmology;  Laterality: Right;  CDE: 130.53  COLONOSCOPY     COLONOSCOPY N/A 12/19/2019   Procedure: COLONOSCOPY;  Surgeon: Shaaron Lamar HERO, MD;  Diverticulosis in the sigmoid and descending colon, redundant colon, otherwise normal exam.    EYE SURGERY Left    Cataract   EYE SURGERY Right    r   ORBITAL RECONSTRUCTION Right    STUMP REVISION Right 11/10/2016   Procedure: REVISION RIGHT BELOW KNEE AMPUTATION;  Surgeon: Jerona Harden GAILS, MD;  Location: [facility] OR;  Service: Orthopedics;   Laterality: Right;   toe removal Right 2012,2014   Family History  Problem Relation Age of Onset   Cirrhosis Mother        secondary to alcohol abuse.    Heart disease Father    Diabetes Sister    Diabetes Brother    Heart attack Paternal Grandfather    Colon cancer Maternal Grandfather        passed in the mid-60's.   Social History   Socioeconomic History   Marital status: Married    Spouse name: Not on file   Number of children: 0   Years of education: 14   Highest education level: Not on file  Occupational History   Occupation: magazine features editor    Comment: [employer]   Occupation: retired  Tobacco Use   Smoking status: Never   Smokeless tobacco: Never  Vaping Use   Vaping status: Never Used  Substance and Sexual Activity   Alcohol use: No   Drug use: No   Sexual activity: Not Currently    Birth control/protection: None  Other Topics Concern   Not on file  Social History Narrative   Not on file   Social Drivers of Health   Financial Resource Strain: Low Risk  (08/02/2023)   Overall Financial Resource Strain (CARDIA)    Difficulty of Paying Living Expenses: Not hard at all  Food Insecurity: No Food Insecurity (08/02/2023)   Hunger Vital Sign    Worried About Running Out of Food in the Last Year: Never true    Ran Out of Food in the Last Year: Never true  Transportation Needs: No Transportation Needs (08/02/2023)   PRAPARE - Administrator, Civil Service (Medical): No    Lack of Transportation (Non-Medical): No  Physical Activity: Insufficiently Active (08/02/2023)   Exercise Vital Sign    Days of Exercise per Week: 3 days    Minutes of Exercise per Session: 30 min  Stress: No Stress Concern Present (08/02/2023)   [employer] of Occupational Health - Occupational Stress Questionnaire    Feeling of Stress : Not at all  Social Connections: Moderately Integrated (08/02/2023)   Social Connection and Isolation Panel [NHANES]    Frequency of Communication with  Friends and Family: More than three times a week    Frequency of Social Gatherings with Friends and Family: Three times a week    Attends Religious Services: More than 4 times per year    Active Member of Clubs or Organizations: No    Attends Banker Meetings: Never    Marital Status: Married    Tobacco Counseling Counseling given: Not Answered   Clinical Intake:  Pre-visit preparation completed: Yes  Pain : No/denies pain     Diabetes: No  How often do you need to have someone help you when you read instructions, pamphlets, or other written materials from your doctor or pharmacy?: 1 - Never  Interpreter Needed?: No  Information entered by :: Charmaine Bloodgood LPN   Activities of  Daily Living    08/02/2023    8:44 AM  In your present state of health, do you have any difficulty performing the following activities:  Hearing? 0  Vision? 0  Difficulty concentrating or making decisions? 0  Walking or climbing stairs? 0  Dressing or bathing? 0  Doing errands, shopping? 0  Preparing Food and eating ? N  Using the Toilet? N  In the past six months, have you accidently leaked urine? N  Do you have problems with loss of bowel control? N  Managing your Medications? N  Managing your Finances? N  Housekeeping or managing your Housekeeping? N    Patient Care Team: Jolinda Norene HERO, DO as PCP - General (Family Medicine) Shaaron Lamar HERO, MD as Consulting Physician (Gastroenterology) Billee Mliss BIRCH, [facility] as [facility] Care Management (Pharmacist)  Indicate any recent Medical Services you may have received from other than [facility] providers in the past year (date may be approximate).     Assessment:   This is a routine wellness examination for Garlin.  Hearing/Vision screen Hearing Screening - Comments:: Denies hearing difficulties   Vision Screening - Comments::  up to date with routine eye exams; in office diabetic eye exam     Goals Addressed    None   Depression Screen    08/02/2023    9:19 AM 07/25/2023   11:32 AM 03/22/2023   12:35 PM 08/20/2022    9:38 AM 07/29/2022    2:18 PM 07/29/2022    2:17 PM 06/01/2022   10:08 AM  PHQ 2/9 Scores  PHQ - 2 Score 0 0 0 0 0 0 0  PHQ- 9 Score 0 0 0 0       Fall Risk    08/02/2023    9:18 AM 03/22/2023   12:36 PM 08/20/2022    9:37 AM 07/29/2022    2:16 PM 06/01/2022   10:08 AM  Fall Risk   Falls in the past year? 0 0 0 0 0  Number falls in past yr: 0 0 0 0   Injury with Fall? 0 0 0 0   Risk for fall due to : No Fall Risks No Fall Risks No Fall Risks No Fall Risks   Follow up Falls prevention discussed;Education provided;Falls evaluation completed Education provided Education provided Falls prevention discussed     MEDICARE RISK AT HOME: Medicare Risk at Home Any stairs in or around the home?: No If so, are there any without handrails?: No Home free of loose throw rugs in walkways, pet beds, electrical cords, etc?: Yes Adequate lighting in your home to reduce risk of falls?: Yes Life alert?: No Use of a cane, walker or w/c?: No Grab bars in the bathroom?: Yes Shower chair or bench in shower?: No Elevated toilet seat or a handicapped toilet?: Yes  TIMED UP AND GO:  Was the test performed?  No    Cognitive Function:        08/02/2023    9:19 AM 07/29/2022    2:19 PM 07/22/2021    1:31 PM  6CIT Screen  What Year? 0 points 0 points 0 points  What month? 0 points 0 points 0 points  What time? 0 points 0 points 0 points  Count back from 20 0 points 0 points 0 points  Months in reverse 0 points 0 points 0 points  Repeat phrase 0 points 0 points 2 points  Total Score 0 points 0 points 2 points    Immunizations  Immunization History  Administered Date(s) Administered   Zoster Recombinant(Shingrix) 01/31/2021, 04/15/2021    TDAP status: Due, Education has been provided regarding the importance of this vaccine. Advised may receive this vaccine at local pharmacy or Health Dept.  Aware to provide a copy of the vaccination record if obtained from local pharmacy or Health Dept. Verbalized acceptance and understanding.  Flu Vaccine status: Declined, Education has been provided regarding the importance of this vaccine but patient still declined. Advised may receive this vaccine at local pharmacy or Health Dept. Aware to provide a copy of the vaccination record if obtained from local pharmacy or Health Dept. Verbalized acceptance and understanding.  Pneumococcal vaccine status: Declined,  Education has been provided regarding the importance of this vaccine but patient still declined. Advised may receive this vaccine at local pharmacy or Health Dept. Aware to provide a copy of the vaccination record if obtained from local pharmacy or Health Dept. Verbalized acceptance and understanding.   Covid-19 vaccine status: Information provided on how to obtain vaccines.   Qualifies for Shingles Vaccine? Yes   Zostavax completed No   Shingrix Completed?: Yes  Screening Tests Health Maintenance  Topic Date Due   COVID-19 Vaccine (1 - 2024-25 season) 08/10/2023 (Originally 03/27/2023)   Pneumonia Vaccine 41+ Years old (1 of 2 - PCV) 08/21/2023 (Originally 10/17/1956)   INFLUENZA VACCINE  10/24/2023 (Originally 02/24/2023)   FOOT EXAM  08/21/2023   HEMOGLOBIN A1C  01/23/2024   Diabetic kidney evaluation - Urine ACR  03/21/2024   OPHTHALMOLOGY EXAM  03/21/2024   Diabetic kidney evaluation - eGFR measurement  07/24/2024   Medicare Annual Wellness (AWV)  08/01/2024   Colonoscopy  12/18/2029   Hepatitis C Screening  Completed   Zoster Vaccines- Shingrix  Completed   HPV VACCINES  Aged Out   DTaP/Tdap/Td  Discontinued    Health Maintenance  There are no preventive care reminders to display for this patient.   Colorectal cancer screening: Type of screening: Colonoscopy. Completed 12/19/19. Repeat every 10 years  Lung Cancer Screening: (Low Dose CT Chest recommended if Age 30-80 years,  20 pack-year currently smoking OR have quit w/in 15years.) does not qualify.   Lung Cancer Screening Referral: n/a  Additional Screening:  Hepatitis C Screening: does qualify; Completed 10/24/19  Vision Screening: Recommended annual ophthalmology exams for early detection of glaucoma and other disorders of the eye. Is the patient up to date with their annual eye exam?  Yes  Who is the provider or what is the name of the office in which the patient attends annual eye exams? In office diabetic eye exam  If pt is not established with a provider, would they like to be referred to a provider to establish care? No .   Dental Screening: Recommended annual dental exams for proper oral hygiene  Diabetic Foot Exam: Diabetic Foot Exam: Completed 08/20/22  Community Resource Referral / Chronic Care Management: CRR required this visit?  No   CCM required this visit?  No     Plan:     I have personally reviewed and noted the following in the patient's chart:   Medical and social history Use of alcohol, tobacco or illicit drugs  Current medications and supplements including opioid prescriptions. Patient is not currently taking opioid prescriptions. Functional ability and status Nutritional status Physical activity Advanced directives List of other physicians Hospitalizations, surgeries, and ER visits in previous 12 months Vitals Screenings to include cognitive, depression, and falls Referrals and appointments  In addition, I have  reviewed and discussed with patient certain preventive protocols, quality metrics, and best practice recommendations. A written personalized care plan for preventive services as well as general preventive health recommendations were provided to patient.     Lavelle Pfeiffer [place], [place]   02/24/7973   After Visit Summary: (MyChart) Due to this being a telephonic visit, the after visit summary with patients personalized plan was offered to patient via MyChart    Nurse Notes: No concerns at this time

## 2023-08-03 DIAGNOSIS — R2681 Unsteadiness on feet: Secondary | ICD-10-CM | POA: Diagnosis not present

## 2023-08-03 DIAGNOSIS — Z89511 Acquired absence of right leg below knee: Secondary | ICD-10-CM | POA: Diagnosis not present

## 2023-08-03 DIAGNOSIS — R5381 Other malaise: Secondary | ICD-10-CM | POA: Diagnosis not present

## 2023-08-08 ENCOUNTER — Ambulatory Visit: Payer: Medicare HMO

## 2023-09-03 DIAGNOSIS — Z89511 Acquired absence of right leg below knee: Secondary | ICD-10-CM | POA: Diagnosis not present

## 2023-09-03 DIAGNOSIS — R5381 Other malaise: Secondary | ICD-10-CM | POA: Diagnosis not present

## 2023-09-03 DIAGNOSIS — R2681 Unsteadiness on feet: Secondary | ICD-10-CM | POA: Diagnosis not present

## 2023-09-20 ENCOUNTER — Encounter: Payer: Self-pay | Admitting: Orthopedic Surgery

## 2023-09-20 ENCOUNTER — Ambulatory Visit: Payer: Medicare HMO | Admitting: Orthopedic Surgery

## 2023-09-20 DIAGNOSIS — Z89511 Acquired absence of right leg below knee: Secondary | ICD-10-CM | POA: Diagnosis not present

## 2023-09-20 DIAGNOSIS — S88111A Complete traumatic amputation at level between knee and ankle, right lower leg, initial encounter: Secondary | ICD-10-CM

## 2023-09-20 NOTE — Progress Notes (Addendum)
 Office Visit Note   Patient: Rick Lane           Date of Birth: 1950-09-14           MRN: [id number] Visit Date: 09/20/2023              Requested by: [name], DO [address] [place],  [place] [address] PCP: [name], DO  Chief Complaint  Patient presents with   Right Leg - Pain    Hx Right BKA      HPI: Patient is a 73 year old gentleman who is seen for evaluation for right transtibial amputation.  Patient states that he is subsiding into the socket causing pain over the residual limb.  Patient states he has developed an ulcer.  Patient states that he has tried to get in touch with [employer] but was unsuccessful.  Assessment & Plan: Visit Diagnoses:  1. Below-knee amputation of right lower extremity, initial encounter (HCC)   2. Lymphedema     Plan: Patient was provided a prescription for [employer] for a new socket liner materials and supplies.  He will call their office and take the prescription with him.  Follow-Up Instructions: Return if symptoms worsen or fail to improve.   Ortho Exam  Patient is alert, oriented, no adenopathy, well-dressed, normal affect, normal respiratory effort. Examination patient has a end bearing ulcer over the distal residual limb.  There is no exposed bone or tendon no cellulitis no drainage.  The wound is 1 cm in diameter 0.1 mm deep.  Patient is subsiding into the socket and developing pressure in the popliteal fossa.  Patient is an existing right transtibial  amputee.  Patient's current comorbidities are not expected to impact the ability to function with the prescribed prosthesis. Patient verbally communicates a strong desire to use a prosthesis. Patient currently requires mobility aids to ambulate without a prosthesis.  Expects not to use mobility aids with a new prosthesis.  Patient is a K3 level ambulator that spends a lot of time walking around on uneven terrain over obstacles, up and down stairs, and ambulates with  a variable cadence.     Imaging: No results found. No images are attached to the encounter.  Labs: Lab Results  Component Value Date   HGBA1C 6.7 (H) 07/25/2023   HGBA1C 6.2 (H) 03/22/2023   HGBA1C 6.4 (H) 08/20/2022     Lab Results  Component Value Date   ALBUMIN 4.5 07/25/2023   ALBUMIN 4.5 05/17/2022   ALBUMIN 4.8 02/12/2022    No results found for: "MG" Lab Results  Component Value Date   VD25OH 37.4 04/28/2017    No results found for: "PREALBUMIN"    Latest Ref Rng & Units 08/20/2022    9:46 AM 10/24/2019   12:03 PM 04/28/2017    2:56 PM  CBC EXTENDED  WBC 3.4 - 10.8 x10E3/uL 4.6  5.9  4.9   RBC 4.14 - 5.80 x10E6/uL 4.77  5.00  4.61   Hemoglobin 13.0 - 17.7 g/dL 19.1  47.8  29.5   HCT 37.5 - 51.0 % 43.7  46.0  41.4   Platelets 150 - 450 x10E3/uL 195  193  184   NEUT# 1.4 - 7.0 x10E3/uL 3.0  3,693  3.1   Lymph# 0.7 - 3.1 x10E3/uL 1.0  1,410  1.1      There is no height or weight on file to calculate BMI.  Orders:  No orders of the defined types were placed in this  encounter.  No orders of the defined types were placed in this encounter.    Procedures: No procedures performed  Clinical Data: No additional findings.  ROS:  All other systems negative, except as noted in the HPI. Review of Systems  Objective: Vital Signs: There were no vitals taken for this visit.  Specialty Comments:  No specialty comments available.  PMFS History: Patient Active Problem List   Diagnosis Date Noted   CKD (chronic kidney disease) stage 2, GFR 60-89 ml/min 06/01/2022   PAD (peripheral artery disease) (HCC) 02/12/2022   Hepatic steatosis 12/15/2020   Benign prostatic hyperplasia with urinary obstruction 08/06/2020   Microalbuminuria due to type 2 diabetes mellitus (HCC) 03/27/2019   Diabetic ulcer of left midfoot associated with type 2 diabetes mellitus, with fat layer exposed (HCC) 10/10/2018   Influenza vaccine refused 05/29/2018   DOE (dyspnea on  exertion) 11/25/2016   S/P unilateral BKA (below knee amputation), right (HCC) 12/12/2015   Hyperlipidemia associated with type 2 diabetes mellitus (HCC) 11/14/2015   Depression 08/11/2015   Vitamin D deficiency 08/11/2015   Asthma 08/11/2015   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    Heart disease Father    Diabetes Sister    Diabetes Brother    Heart attack Paternal Grandfather    Colon cancer Maternal Grandfather        passed in the mid-60's.    Past Surgical History:  Procedure Laterality Date   AMPUTATION Right 12/12/2015   Procedure: Right Transmetatarsal Amputation With [facility] Placement;  Surgeon: Nadara Mustard, MD;  Location[facility];  Service: Orthopedics;  Laterality: Right;   AMPUTATION Right 03/05/2016   Procedure: RIGHT BELOW KNEE AMPUTATION;  Surgeon: Nadara Mustard, MD;  Location: [facility] OR;  Service: Orthopedics;  Laterality: Right;   CATARACT EXTRACTION W/PHACO Right 10/02/2018   Procedure: CATARACT EXTRACTION PHACO AND INTRAOCULAR LENS PLACEMENT (IOC);  Surgeon: Fabio Pierce, MD;  Location: [facility];  Service: Ophthalmology;  Laterality: Right;  CDE: 130.53   COLONOSCOPY     COLONOSCOPY N/A 12/19/2019   Procedure: COLONOSCOPY;  Surgeon: Corbin Ade, MD;  Diverticulosis in the sigmoid and descending colon, redundant colon, otherwise normal exam.    EYE SURGERY Left    Cataract   EYE SURGERY Right    r   ORBITAL RECONSTRUCTION Right    STUMP REVISION Right 11/10/2016   Procedure: REVISION RIGHT BELOW KNEE AMPUTATION;  Surgeon: Nadara Mustard, MD;  Location: [facility] OR;  Service: Orthopedics;  Laterality: Right;   toe  removal Right 2012,2014   Social History   Occupational History   Occupation: Magazine features editor    Comment: [employer]   Occupation: retired  Tobacco Use   Smoking status: Never   Smokeless tobacco: Never  Vaping Use   Vaping status: Never Used  Substance and Sexual Activity   Alcohol use: No   Drug use: No   Sexual activity: Not Currently    Birth control/protection: None

## 2023-11-07 DIAGNOSIS — Z89511 Acquired absence of right leg below knee: Secondary | ICD-10-CM | POA: Diagnosis not present

## 2024-02-28 ENCOUNTER — Encounter: Payer: Self-pay | Admitting: Family Medicine

## 2024-02-28 ENCOUNTER — Ambulatory Visit: Payer: Medicare HMO | Admitting: Family Medicine

## 2024-02-28 ENCOUNTER — Ambulatory Visit: Payer: Self-pay | Admitting: Family Medicine

## 2024-02-28 VITALS — BP 146/84 | HR 80 | Temp 98.0°F | Ht 74.0 in | Wt 289.0 lb

## 2024-02-28 DIAGNOSIS — L89892 Pressure ulcer of other site, stage 2: Secondary | ICD-10-CM | POA: Diagnosis not present

## 2024-02-28 DIAGNOSIS — Z125 Encounter for screening for malignant neoplasm of prostate: Secondary | ICD-10-CM

## 2024-02-28 DIAGNOSIS — R809 Proteinuria, unspecified: Secondary | ICD-10-CM | POA: Diagnosis not present

## 2024-02-28 DIAGNOSIS — I739 Peripheral vascular disease, unspecified: Secondary | ICD-10-CM

## 2024-02-28 DIAGNOSIS — E785 Hyperlipidemia, unspecified: Secondary | ICD-10-CM

## 2024-02-28 DIAGNOSIS — E559 Vitamin D deficiency, unspecified: Secondary | ICD-10-CM | POA: Diagnosis not present

## 2024-02-28 DIAGNOSIS — E1129 Type 2 diabetes mellitus with other diabetic kidney complication: Secondary | ICD-10-CM

## 2024-02-28 DIAGNOSIS — K760 Fatty (change of) liver, not elsewhere classified: Secondary | ICD-10-CM

## 2024-02-28 DIAGNOSIS — Z0001 Encounter for general adult medical examination with abnormal findings: Secondary | ICD-10-CM

## 2024-02-28 DIAGNOSIS — Z794 Long term (current) use of insulin: Secondary | ICD-10-CM | POA: Diagnosis not present

## 2024-02-28 DIAGNOSIS — Z0000 Encounter for general adult medical examination without abnormal findings: Secondary | ICD-10-CM

## 2024-02-28 DIAGNOSIS — E1169 Type 2 diabetes mellitus with other specified complication: Secondary | ICD-10-CM | POA: Diagnosis not present

## 2024-02-28 DIAGNOSIS — B372 Candidiasis of skin and nail: Secondary | ICD-10-CM | POA: Diagnosis not present

## 2024-02-28 DIAGNOSIS — L219 Seborrheic dermatitis, unspecified: Secondary | ICD-10-CM

## 2024-02-28 DIAGNOSIS — Z89511 Acquired absence of right leg below knee: Secondary | ICD-10-CM | POA: Diagnosis not present

## 2024-02-28 LAB — LIPID PANEL

## 2024-02-28 LAB — BAYER DCA HB A1C WAIVED: HB A1C (BAYER DCA - WAIVED): 7.3 % — ABNORMAL HIGH (ref 4.8–5.6)

## 2024-02-28 MED ORDER — MUPIROCIN CALCIUM 2 % EX CREA: 1.0000 | TOPICAL_CREAM | Freq: Two times a day (BID) | CUTANEOUS | 0 refills | Status: DC

## 2024-02-28 MED ORDER — KETOCONAZOLE 2 % EX SHAM: MEDICATED_SHAMPOO | CUTANEOUS | 2 refills | Status: AC

## 2024-02-28 MED ORDER — NYSTATIN 100000 UNIT/GM EX CREA
1.0000 | TOPICAL_CREAM | Freq: Two times a day (BID) | CUTANEOUS | 0 refills | Status: DC
Start: 2024-02-28 — End: 2024-06-05

## 2024-02-28 NOTE — Progress Notes (Signed)
 Rick Lane is a 73 y.o. male presents to office today for annual physical exam examination.    Concerns today include: 1. Type 2 Diabetes with hypertension, hyperlipidemia w/ CKD2/ microalbuminuria and history of BKA on right:  Compliant with insulins.  He reports no hypoglycemic episodes.  Continues to take statin as directed and lisinopril .  He reports that the prosthetic on the right has been rubbing a little bit on his stump and he apparently has it too short so he switch to see [place] soon for adjustment  Last eye exam: UTD Last foot exam: needs Last A1c:  Lab Results  Component Value Date   HGBA1C 6.7 (H) 07/25/2023   Nephropathy screen indicated?: UTD Last flu, zoster and/or pneumovax:  Immunization History  Administered Date(s) Administered   Zoster Recombinant(Shingrix) 01/31/2021, 04/15/2021    ROS: No chest pain, shortness of breath.  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  Vision has been stable.  He does have an area of skin breakdown on his right stump and would like that to be looked at today.  This has been ongoing for a couple of months now  2.  Rash Patient reports a rash on his chest that is been present for a few weeks.  It was initially very itchy and so he bought an anti-itch rollerball over-the-counter and that seemed to improve the itchiness but not resolved the rash  He also has an itchy rash that occurs on his eyes and he gets a lot of crusting along the right eye.   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) 11/02/2016   Depression    Diabetes mellitus without complication (HCC)    Type II   Hyperlipidemia    Neuropathy    Neuropathy    Sleep apnea    Social History   Socioeconomic History   Marital status: Married    Spouse name: Not on file   Number of children: 0   Years of education: 14   Highest education level: Not on file  Occupational History   Occupation: Magazine features editor    Comment: Lawyer   Occupation: retired  Tobacco Use   Smoking status: Never   Smokeless tobacco: Never  Vaping Use   Vaping status: Never Used  Substance and Sexual Activity   Alcohol use: No   Drug use: No   Sexual activity: Not Currently    Birth control/protection: None  Other Topics Concern   Not on file  Social History Narrative   Not on file   Social Drivers of Health   Financial Resource Strain: Low Risk  (08/02/2023)   Overall Financial Resource Strain (CARDIA)    Difficulty of Paying Living Expenses: Not hard at all  Food Insecurity: No Food Insecurity (08/02/2023)   Hunger Vital Sign    Worried About Running  Out of Food in the Last Year: Never true    Ran Out of Food in the Last Year: Never true  Transportation Needs: No Transportation Needs (08/02/2023)   PRAPARE - Administrator, Civil Service (Medical): No    Lack of Transportation (Non-Medical): No  Physical Activity: Insufficiently Active (08/02/2023)   Exercise Vital Sign    Days of Exercise per Week: 3 days    Minutes of Exercise per Session: 30 min  Stress: No Stress Concern Present (08/02/2023)   [employer] of Occupational Health - Occupational Stress Questionnaire    Feeling of Stress : Not at all  Social Connections: Moderately Integrated (08/02/2023)   Social Connection and Isolation Panel    Frequency of  Communication with Friends and Family: More than three times a week    Frequency of Social Gatherings with Friends and Family: Three times a week    Attends Religious Services: More than 4 times per year    Active Member of Clubs or Organizations: No    Attends Banker Meetings: Never    Marital Status: Married  Catering manager Violence: Not At Risk (08/02/2023)   Humiliation, Afraid, Rape, and Kick questionnaire    Fear of Current or Ex-Partner: No    Emotionally Abused: No    Physically Abused: No    Sexually Abused: No   Past Surgical History:  Procedure Laterality Date   AMPUTATION Right 12/12/2015   Procedure: Right Transmetatarsal Amputation With Prevena VAC Placement;  Surgeon: Jerona Harden GAILS, MD;  Location: [facility] OR;  Service: Orthopedics;  Laterality: Right;   AMPUTATION Right 03/05/2016   Procedure: RIGHT BELOW KNEE AMPUTATION;  Surgeon: Jerona GAILS Harden, MD;  Location: [facility] OR;  Service: Orthopedics;  Laterality: Right;   CATARACT EXTRACTION W/PHACO Right 10/02/2018   Procedure: CATARACT EXTRACTION PHACO AND INTRAOCULAR LENS PLACEMENT (IOC);  Surgeon: Harrie Agent, MD;  Location: [facility] ORS;  Service: Ophthalmology;  Laterality: Right;  CDE: 130.53   COLONOSCOPY     COLONOSCOPY N/A 12/19/2019   Procedure: COLONOSCOPY;  Surgeon: Shaaron Lamar HERO, MD;  Diverticulosis in the sigmoid and descending colon, redundant colon, otherwise normal exam.    EYE SURGERY Left    Cataract   EYE SURGERY Right    r   ORBITAL RECONSTRUCTION Right    STUMP REVISION Right 11/10/2016   Procedure: REVISION RIGHT BELOW KNEE AMPUTATION;  Surgeon: Jerona Harden GAILS, MD;  Location: [facility] OR;  Service: Orthopedics;  Laterality: Right;   toe removal Right 2012,2014   Family History  Problem Relation Age of Onset   Cirrhosis Mother        secondary to alcohol abuse.    Heart disease Father    Diabetes Sister    Diabetes Brother    Aneurysm Brother        brain   Colon cancer Maternal Grandfather         passed in the mid-60's.   Heart attack Paternal Grandfather     Current Outpatient Medications:    buPROPion  (WELLBUTRIN  XL) 150 MG 24 hr tablet, Take 1 tablet (150 mg total) by mouth daily., Disp: 90 tablet, Rfl: 3   cholecalciferol (VITAMIN D3) 25 MCG (1000 UNIT) tablet, Take 1,000 Units by mouth daily., Disp: , Rfl:    lisinopril  (ZESTRIL ) 5 MG tablet, Take 1 tablet (5 mg total) by mouth daily., Disp: 90 tablet, Rfl: 3   Multiple Vitamin (MULTIVITAMIN WITH MINERALS) TABS tablet, Take 1 tablet by mouth daily. Equate Multivitamin  for Men 50+, Disp: , Rfl:    NOVOLOG  FLEXPEN 100 UNIT/ML FlexPen, Inject 10 Units into the skin 2 (two) times daily with breakfast and lunch AND 8-9 Units daily with supper., Disp: 15 mL, Rfl: 12   rosuvastatin  (CRESTOR ) 20 MG tablet, Take 1 tablet (20 mg total) by mouth daily., Disp: 90 tablet, Rfl: 3   TRESIBA  FLEXTOUCH 200 UNIT/ML FlexTouch Pen, Inject 60 Units into the skin at bedtime., Disp: 45 mL, Rfl: 3  Allergies  Allergen Reactions   Bactrim  [Sulfamethoxazole -Trimethoprim ] Itching   Gentamicin Other (See Comments)    Unknown     ROS: Review of Systems Pertinent items noted in HPI and remainder of comprehensive ROS otherwise negative.    Physical exam BP (!) 146/84   Pulse 80   Temp 98 F (36.7 C)   Ht 6' 2 (1.88 m)   Wt 289 lb (131.1 kg)   SpO2 94%   BMI 37.11 kg/m  General appearance: alert, cooperative, appears stated age, no distress, and moderately obese Head: Normocephalic, without obvious abnormality, atraumatic Eyes: Ptosis of bilateral eyes present.  He has periorbital dermatitis his eyes bilaterally.  No conjunctival injection.  PERRLA, EOMI. Ears: Dermatitis noted in the external auditory canals.  TMs intact bilaterally with normal light reflex Nose: Slightly deviated septum with normal nasal turbinates.  No drainage Throat: lips, mucosa, and tongue normal; teeth and gums normal Neck: no adenopathy, supple, symmetrical, trachea  midline, and thyroid not enlarged, symmetric, no tenderness/mass/nodules Back: Increased kyphosis of thoracic spine present.  Ambulating with use of cane Lungs: clear to auscultation bilaterally Chest wall: no tenderness Heart: regular rate and rhythm, S1, S2 normal, no murmur, click, rub or gallop Abdomen: Obese, soft, nontender Extremities: Surgically absent right lower extremity.  He has an area of hyperemia with some mild skin breakdown noted at the distal portion of the right stump.  There is no oozing or induration but there is mild tenderness Pulses: 2+ and symmetric Skin: Dermatitis periorbitally as above.  He also has a slightly macerated rash underneath the left breast.  No exudates Lymph nodes: Cervical, supraclavicular, and axillary nodes normal. Neurologic: Difficult of hearing  Diabetic Foot Exam - Simple   Simple Foot Form Diabetic Foot exam was performed with the following findings: Yes 02/28/2024 12:06 PM  Visual Inspection See comments: Yes Sensation Testing See comments: Yes Pulse Check See comments: Yes Comments +1 pedal pulses.  He has dermatitis noted along the lower leg on the left.  Right lower extremity surgically absent.  Totally absent monofilament sensation throughout left foot        02/28/2024   11:22 AM 08/02/2023    9:19 AM 07/25/2023   11:32 AM  Depression screen PHQ 2/9  Decreased Interest 1 0 0  Down, Depressed, Hopeless 1 0 0  PHQ - 2 Score 2 0 0  Altered sleeping 1 0 0  Tired, decreased energy 1 0 0  Change in appetite 1 0 0  Feeling bad or failure about yourself  0 0 0  Trouble concentrating 0 0 0  Moving slowly or fidgety/restless 0 0 0  Suicidal thoughts 0 0 0  PHQ-9 Score 5 0 0  Difficult doing work/chores Somewhat difficult Not difficult at all Not difficult at all      02/28/2024   11:23 AM 07/25/2023   11:32 AM 03/22/2023   12:36 PM 08/20/2022    9:37 AM  GAD 7 : Generalized Anxiety Score  Nervous, Anxious, on Edge 0 0  0 0   Control/stop worrying 0 0 0 0  Worry too much - different things 0 0 0 0  Trouble relaxing 0 0 0 0  Restless 0 0 0 0  Easily annoyed or irritable 1 0 0 0  Afraid - awful might happen 0 0 0 0  Total GAD 7 Score 1 0 0 0  Anxiety Difficulty Not difficult at all  Not difficult at all Not difficult at all     Assessment/ Plan: Charlie Sauer here for annual physical exam.   Annual physical exam  Seborrheic dermatitis - Plan: ketoconazole  (NIZORAL ) 2 % shampoo  Candidal intertrigo - Plan: nystatin  cream (MYCOSTATIN )  Pressure injury of right knee, stage 2 (HCC) - Plan: mupirocin  cream (BACTROBAN ) 2 %  Type 2 diabetes mellitus with microalbuminuria, with long-term current use of insulin  (HCC) - Plan: Bayer DCA Hb A1c Waived, Microalbumin / creatinine urine ratio, CBC, CMP14+EGFR  Hyperlipidemia associated with type 2 diabetes mellitus (HCC) - Plan: Lipid Panel, TSH, CMP14+EGFR  Hepatic steatosis - Plan: Lipid Panel, TSH, CBC, CMP14+EGFR  S/P unilateral BKA (below knee amputation), right (HCC) - Plan: CMP14+EGFR  PAD (peripheral artery disease) (HCC) - Plan: CBC, CMP14+EGFR  Vitamin D  deficiency - Plan: VITAMIN D  25 Hydroxy (Vit-D Deficiency, Fractures), CMP14+EGFR  Screening for malignant neoplasm of prostate - Plan: PSA  Ketoconazole  applied to the brow and periorbital areas for seborrheic dermatitis  Did all intertrigo to be treated with nystatin  cream topically  Bactroban  given for shallow pressure injury on the right but given history if he has any progression or any concerning issues of the resolution he is to seek immediate medical attention as we do not want to risk a deeper injury/infection  Urine microalbumin, fasting labs collected today.  He will continue all medications as prescribed for now  Check vitamin D  given history of deficiency  Screening PSA also collected but he is asymptomatic  Counseled on healthy lifestyle choices, including diet (rich in fruits,  vegetables and lean meats and low in salt and simple carbohydrates) and exercise (at least 30 minutes of moderate physical activity daily).  Patient to follow up 12m   Sung Parodi M. Jolinda, DO

## 2024-02-28 NOTE — Patient Instructions (Signed)
Seborrheic Dermatitis, Adult Seborrheic dermatitis is a skin disease that causes red, scaly patches. It often occurs on the scalp, where it may be called dandruff. The patches may also appear on other parts of the body. Skin patches tend to occur where there are a lot of oil glands in the skin. Areas of the body that may be affected include: The scalp. The face, eyebrows, and ears. The area around a beard. Skin folds of the body. This includes the armpits, groin, and buttocks. The chest. The condition is often long-lasting (chronic). It may come and go for no known reason. It may be activated by a trigger, such as: Cold weather. Being out in the sun. Stress. Drinking alcohol. What are the causes? The cause of this condition is not known. It may be related to having too much yeast on the skin or changes in how your body's disease-fighting system (immune system) works. What increases the risk? You may be more likely to develop this condition if: You have a weak immune system. You are 72 years old or older. You have other conditions, such as: Human immunodeficiency virus (HIV) or acquired immunodeficiency virus (AIDS). Parkinson's disease. Mood disorders, such as depression. Liver problems. Obesity. What are the signs or symptoms? Symptoms of this condition include: Thick scales on the scalp. Redness on the face or in the armpits. Skin that is flaky. The flakes may be white or yellow. Skin that seems oily or dry but is not helped with moisturizers. Itching or burning in the affected areas. How is this diagnosed? This condition is diagnosed with a medical history and physical exam. A sample of your skin may be tested (skin biopsy). You may need to see a skin specialist (dermatologist). How is this treated? There is no cure for this condition, but treatment can help to manage the symptoms. You may get treatment to remove scales, lower the risk of skin infection, and reduce swelling or  itching. Treatment may include: Medicated shampoos, moisturizing creams, or ointments. Creams that reduce skin yeast. Creams that reduce swelling and irritation (steroids). Follow these instructions at home: Skin care Use any medicated shampoo, skin creams, or ointments only as told by your health care provider. Do not use skin products that contain alcohol. Take lukewarm baths or showers. Avoid very hot water. When you are outside, wear a hat and clothes that block UV light. General instructions Apply over-the-counter and prescription medicines only as told by your health care provider. Learn what triggers your symptoms so you can avoid these things. Use techniques for stress reduction, such as meditation or yoga. Do not drink alcohol if your health care provider tells you not to drink. Keep all follow-up visits. Your health care provider will check your skin to make sure the treatments are helping. Where to find more information American Academy of Dermatology: [employer] Contact a health care provider if: Your symptoms do not get better with treatment. Your symptoms get worse. You have new symptoms. Get help right away if: Your condition quickly gets worse, even with treatment. This information is not intended to replace advice given to you by your health care provider. Make sure you discuss any questions you have with your health care provider. Document Revised: 12/11/2021 Document Reviewed: 12/11/2021 Elsevier Patient Education  2024 [employer].

## 2024-02-29 LAB — CMP14+EGFR
ALT: 67 IU/L — AB (ref 0–44)
AST: 41 IU/L — AB (ref 0–40)
Albumin: 4.8 g/dL (ref 3.8–4.8)
Alkaline Phosphatase: 48 IU/L (ref 44–121)
BUN/Creatinine Ratio: 15 (ref 10–24)
BUN: 19 mg/dL (ref 8–27)
Bilirubin Total: 0.6 mg/dL (ref 0.0–1.2)
CO2: 22 mmol/L (ref 20–29)
Calcium: 10.6 mg/dL — AB (ref 8.6–10.2)
Chloride: 100 mmol/L (ref 96–106)
Creatinine, Ser: 1.23 mg/dL (ref 0.76–1.27)
Globulin, Total: 2.3 g/dL (ref 1.5–4.5)
Glucose: 145 mg/dL — AB (ref 70–99)
Potassium: 4.4 mmol/L (ref 3.5–5.2)
Sodium: 137 mmol/L (ref 134–144)
Total Protein: 7.1 g/dL (ref 6.0–8.5)
eGFR: 62 mL/min/1.73 (ref >59)

## 2024-02-29 LAB — CBC
Hematocrit: 45.3 % (ref 37.5–51.0)
Hemoglobin: 15.3 g/dL (ref 13.0–17.7)
MCH: 31.5 pg (ref 26.6–33.0)
MCHC: 33.8 g/dL (ref 31.5–35.7)
MCV: 93 fL (ref 79–97)
Platelets: 202 x10E3/uL (ref 150–450)
RBC: 4.85 x10E6/uL (ref 4.14–5.80)
RDW: 13.3 % (ref 11.6–15.4)
WBC: 5.3 x10E3/uL (ref 3.4–10.8)

## 2024-02-29 LAB — VITAMIN D 25 HYDROXY (VIT D DEFICIENCY, FRACTURES): Vit D, 25-Hydroxy: 18.4 ng/mL — AB (ref 30.0–100.0)

## 2024-02-29 LAB — LIPID PANEL
Cholesterol, Total: 284 mg/dL — AB (ref 100–199)
HDL: 44 mg/dL (ref >39)
LDL CALC COMMENT:: 6.5 ratio — AB (ref 0.0–5.0)
LDL Chol Calc (NIH): 190 mg/dL — AB (ref 0–99)
Triglycerides: 257 mg/dL — AB (ref 0–149)
VLDL Cholesterol Cal: 50 mg/dL — AB (ref 5–40)

## 2024-02-29 LAB — PSA: Prostate Specific Ag, Serum: 0.5 ng/mL (ref 0.0–4.0)

## 2024-02-29 LAB — MICROALBUMIN / CREATININE URINE RATIO
Creatinine, Urine: 47.4 mg/dL
Microalb/Creat Ratio: 28 mg/g{creat} (ref 0–29)
Microalbumin, Urine: 13.3 ug/mL

## 2024-02-29 LAB — TSH: TSH: 3.67 u[IU]/mL (ref 0.450–4.500)

## 2024-02-29 MED ORDER — VITAMIN D (ERGOCALCIFEROL) 1.25 MG (50000 UNIT) PO CAPS: 50000.0000 [IU] | ORAL_CAPSULE | ORAL | 3 refills | Status: AC

## 2024-02-29 MED ORDER — ROSUVASTATIN CALCIUM 40 MG PO TABS: 40.0000 mg | ORAL_TABLET | Freq: Every day | ORAL | 3 refills | Status: DC

## 2024-04-22 ENCOUNTER — Emergency Department (HOSPITAL_COMMUNITY)

## 2024-04-22 ENCOUNTER — Emergency Department (HOSPITAL_COMMUNITY): Admission: EM | Admit: 2024-04-22 | Discharge: 2024-04-23 | Disposition: A | Discharge status: Home / Self Care

## 2024-04-22 ENCOUNTER — Encounter (HOSPITAL_COMMUNITY): Payer: Self-pay

## 2024-04-22 ENCOUNTER — Other Ambulatory Visit: Payer: Self-pay

## 2024-04-22 DIAGNOSIS — E785 Hyperlipidemia, unspecified: Secondary | ICD-10-CM | POA: Diagnosis not present

## 2024-04-22 DIAGNOSIS — R55 Syncope and collapse: Secondary | ICD-10-CM | POA: Insufficient documentation

## 2024-04-22 DIAGNOSIS — G319 Degenerative disease of nervous system, unspecified: Secondary | ICD-10-CM | POA: Insufficient documentation

## 2024-04-22 DIAGNOSIS — E119 Type 2 diabetes mellitus without complications: Secondary | ICD-10-CM | POA: Insufficient documentation

## 2024-04-22 DIAGNOSIS — R29810 Facial weakness: Secondary | ICD-10-CM | POA: Diagnosis not present

## 2024-04-22 DIAGNOSIS — R42 Dizziness and giddiness: Secondary | ICD-10-CM | POA: Insufficient documentation

## 2024-04-22 DIAGNOSIS — I6782 Cerebral ischemia: Secondary | ICD-10-CM | POA: Diagnosis not present

## 2024-04-22 DIAGNOSIS — I700 Atherosclerosis of aorta: Secondary | ICD-10-CM | POA: Insufficient documentation

## 2024-04-22 DIAGNOSIS — I672 Cerebral atherosclerosis: Secondary | ICD-10-CM | POA: Diagnosis not present

## 2024-04-22 DIAGNOSIS — R531 Weakness: Secondary | ICD-10-CM | POA: Diagnosis not present

## 2024-04-22 DIAGNOSIS — G459 Transient cerebral ischemic attack, unspecified: Secondary | ICD-10-CM

## 2024-04-22 DIAGNOSIS — Z794 Long term (current) use of insulin: Secondary | ICD-10-CM | POA: Diagnosis not present

## 2024-04-22 DIAGNOSIS — I6523 Occlusion and stenosis of bilateral carotid arteries: Secondary | ICD-10-CM | POA: Diagnosis not present

## 2024-04-22 DIAGNOSIS — I4510 Unspecified right bundle-branch block: Secondary | ICD-10-CM | POA: Diagnosis not present

## 2024-04-22 HISTORY — DX: Transient cerebral ischemic attack, unspecified: G45.9

## 2024-04-22 LAB — DIFFERENTIAL
Abs Immature Granulocytes: 0.03 K/uL (ref 0.00–0.07)
Basophils Absolute: 0 K/uL (ref 0.0–0.1)
Basophils Relative: 1 %
Eosinophils Absolute: 0.2 K/uL (ref 0.0–0.5)
Eosinophils Relative: 3 %
Immature Granulocytes: 1 %
Lymphocytes Relative: 21 %
Lymphs Abs: 1.1 K/uL (ref 0.7–4.0)
Monocytes Absolute: 0.5 K/uL (ref 0.1–1.0)
Monocytes Relative: 9 %
Neutro Abs: 3.5 K/uL (ref 1.7–7.7)
Neutrophils Relative %: 65 %

## 2024-04-22 LAB — CBC
HCT: 42.1 % (ref 39.0–52.0)
Hemoglobin: 14.2 g/dL (ref 13.0–17.0)
MCH: 31.5 pg (ref 26.0–34.0)
MCHC: 33.7 g/dL (ref 30.0–36.0)
MCV: 93.3 fL (ref 80.0–100.0)
Platelets: 169 K/uL (ref 150–400)
RBC: 4.51 MIL/uL (ref 4.22–5.81)
RDW: 13.6 % (ref 11.5–15.5)
WBC: 5.3 K/uL (ref 4.0–10.5)
nRBC: 0 % (ref 0.0–0.2)

## 2024-04-22 LAB — I-STAT CHEM 8, ED
BUN: 24 mg/dL — ABNORMAL HIGH (ref 8–23)
Calcium, Ion: 1.24 mmol/L (ref 1.15–1.40)
Chloride: 107 mmol/L (ref 98–111)
Creatinine, Ser: 1.4 mg/dL — ABNORMAL HIGH (ref 0.61–1.24)
Glucose, Bld: 264 mg/dL — ABNORMAL HIGH (ref 70–99)
HCT: 40 % (ref 39.0–52.0)
Hemoglobin: 13.6 g/dL (ref 13.0–17.0)
Potassium: 4.2 mmol/L (ref 3.5–5.1)
Sodium: 140 mmol/L (ref 135–145)
TCO2: 21 mmol/L — ABNORMAL LOW (ref 22–32)

## 2024-04-22 LAB — COMPREHENSIVE METABOLIC PANEL WITH GFR
ALT: 70 U/L — ABNORMAL HIGH (ref 0–44)
AST: 41 U/L (ref 15–41)
Albumin: 3.7 g/dL (ref 3.5–5.0)
Alkaline Phosphatase: 34 U/L — ABNORMAL LOW (ref 38–126)
Anion gap: 12 (ref 5–15)
BUN: 23 mg/dL (ref 8–23)
CO2: 22 mmol/L (ref 22–32)
Calcium: 9.7 mg/dL (ref 8.9–10.3)
Chloride: 106 mmol/L (ref 98–111)
Creatinine, Ser: 1.38 mg/dL — ABNORMAL HIGH (ref 0.61–1.24)
GFR, Estimated: 54 mL/min — ABNORMAL LOW (ref >60)
Glucose, Bld: 251 mg/dL — ABNORMAL HIGH (ref 70–99)
Potassium: 4.2 mmol/L (ref 3.5–5.1)
Sodium: 140 mmol/L (ref 135–145)
Total Bilirubin: 0.7 mg/dL (ref 0.0–1.2)
Total Protein: 6.4 g/dL — ABNORMAL LOW (ref 6.5–8.1)

## 2024-04-22 LAB — ETHANOL: Alcohol, Ethyl (B): <15 mg/dL (ref <15)

## 2024-04-22 LAB — PROTIME-INR
INR: 1 (ref 0.8–1.2)
Prothrombin Time: 14 s (ref 11.4–15.2)

## 2024-04-22 LAB — APTT: aPTT: 26 s (ref 24–36)

## 2024-04-22 MED ORDER — SODIUM CHLORIDE 0.9% FLUSH
3.0000 mL | Freq: Once | INTRAVENOUS | Status: AC
  Administered 2024-04-22: 3 mL via INTRAVENOUS

## 2024-04-22 MED ORDER — IOHEXOL 350 MG/ML SOLN
75.0000 mL | Freq: Once | INTRAVENOUS | Status: AC | PRN
Start: 2024-04-22 — End: 2024-04-22
  Administered 2024-04-22: 75 mL via INTRAVENOUS

## 2024-04-22 NOTE — ED Notes (Signed)
 Pt given some water per provider ok and post swallow eval pass.

## 2024-04-22 NOTE — ED Provider Notes (Signed)
 Care of patient received from prior provider at 2:57 AM, please see their note for complete H/P and care plan.  Received handoff per ED course.  Clinical Course as of 04/23/24 9742  [place] Apr 22, 2024  2302 Stable  HO ART Near syncopal event. Included left sided weakness. Returned to baseline. MRI completed. Neurology consulted and requested CTA H&N Not on any AP or AC Discuss with neuro after imaging. [CC]  Mon Apr 23, 2024  0251 Garbled speech and falling over with the event.  [CC]    Clinical Course User Index [CC] Jerral Meth, MD    Reassessment: MRI and CT angiography were reviewed.  Message neurology on-call to review imaging and care plan.  They feel comfortable outpatient follow-up with neurology.  Patient is very motivated for outpatient care over inpatient care and discussed with him that we could keep him for observation for high risk TIA but patient would prefer to follow-up in the outpatient setting as well. Strict turn precautions reinforced.  Is asymptomatic ambulatory on reassessment.  Family ember at bedside feels comfortable discharge.  Strict return precautions reinforced.  Risk of stroke in the setting of TIA also reinforced and discussed return precautions and these symptoms.  Disposition:  I have considered need for hospitalization, however, considering all of the above, I believe this patient is stable for discharge at this time.  Patient/family educated about specific return precautions for given chief complaint and symptoms.  Patient/family educated about follow-up with PCP.     Patient/family expressed understanding of return precautions and need for follow-up. Patient spoken to regarding all imaging and laboratory results and appropriate follow up for these results. All education provided in verbal form with additional information in written form. Time was allowed for answering of patient questions. Patient discharged.    Emergency Department Medication  Summary:   Medications  sodium chloride  flush (NS) 0.9 % injection 3 mL (3 mLs Intravenous Given 04/22/24 1454)  iohexol (OMNIPAQUE) 350 MG/ML injection 75 mL (75 mLs Intravenous Contrast Given 04/22/24 2241)            Jerral Meth, MD 04/23/24 [phone]

## 2024-04-22 NOTE — ED Notes (Signed)
 Received report. Pt resting, no needs voiced. Denies numbness/tingling/weakness. Reports feels at baseline. Call bell within reach.

## 2024-04-22 NOTE — ED Notes (Signed)
 Pt taken to CT.

## 2024-04-22 NOTE — ED Notes (Signed)
 Patient needed to use the restroom, patient given a walker and walked to the restroom

## 2024-04-22 NOTE — ED Triage Notes (Signed)
 Pt BIB [facility] from a restaurant where the pt started having complete L side paralysis around 130 pm. All symptoms were resolved after ems arrival. A&O X4.

## 2024-04-22 NOTE — ED Notes (Signed)
 Pt back from MRI. Re-connected to the monitor. Continues to deny any numbness/tingling anywhere.

## 2024-04-22 NOTE — ED Provider Notes (Signed)
 [name] EMERGENCY DEPARTMENT AT [facility] Provider Note   CSN: [id number] Arrival date & time: 04/22/24  1435     Patient presents with: possible TIA   Rick Lane is a 73 y.o. male.   73 year old male with past medical history of diabetes and hyperlipidemia presenting to the emergency department today with an episode of fatigue earlier.  The patient states this began around 130 after eating lunch.  The patient states that he was feeling very weak overall.  According to medics he apparently had some left-sided weakness but the patient denies this.  He states that he did not really feel any weakness on the left or right specifically.  He was brought to the ER at that time for further evaluation.  The patient states he is feeling back to normal.  Reports that has been feeling well the past few days.  He denies any chest pain, shortness of breath, or infectious symptoms.        Prior to Admission medications   Medication Sig Start Date End Date Taking? Authorizing Provider  buPROPion  (WELLBUTRIN  XL) 150 MG 24 hr tablet Take 1 tablet (150 mg total) by mouth daily. 07/26/23   Jolinda Norene HERO, DO  cholecalciferol (VITAMIN D3) 25 MCG (1000 UNIT) tablet Take 1,000 Units by mouth daily.    [provider]  ketoconazole  (NIZORAL ) 2 % shampoo Lather affected areas on face and leave on for 10 mins. Then rinse.  Use twice weekly until rash resolves. 02/28/24   Jolinda Norene HERO, DO  lisinopril  (ZESTRIL ) 5 MG tablet Take 1 tablet (5 mg total) by mouth daily. 07/26/23   Jolinda Norene HERO, DO  Multiple Vitamin (MULTIVITAMIN WITH MINERALS) TABS tablet Take 1 tablet by mouth daily. Equate Multivitamin for Men 50+    [provider]  mupirocin  cream (BACTROBAN ) 2 % Apply 1 Application topically 2 (two) times daily. To area of skin breakdown on right leg. 02/28/24   Jolinda Norene M, DO  NOVOLOG  FLEXPEN 100 UNIT/ML FlexPen Inject 10 Units into the skin 2 (two) times  daily with breakfast and lunch AND 8-9 Units daily with supper. 07/26/23   Jolinda Norene HERO, DO  nystatin  cream (MYCOSTATIN ) Apply 1 Application topically 2 (two) times daily. X7-14 days to rash on chest. 02/28/24   Jolinda Norene HERO, DO  rosuvastatin  (CRESTOR ) 40 MG tablet Take 1 tablet (40 mg total) by mouth daily. 02/29/24   Jolinda Norene HERO, DO  TRESIBA  FLEXTOUCH 200 UNIT/ML FlexTouch Pen Inject 60 Units into the skin at bedtime. 07/26/23   Jolinda Norene HERO, DO  Vitamin D , Ergocalciferol , (DRISDOL ) 1.25 MG (50000 UNIT) CAPS capsule Take 1 capsule (50,000 Units total) by mouth every 7 (seven) days. 02/29/24   Jolinda Norene HERO, DO    Allergies: Bactrim  [sulfamethoxazole -trimethoprim ] and Gentamicin    Review of Systems  Constitutional:  Positive for fatigue.  All other systems reviewed and are negative.   Updated Vital Signs BP (!) 155/83   Pulse 82   Temp 98.4 F (36.9 C)   Resp 16   SpO2 99%   Physical Exam Vitals and nursing note reviewed.   Gen: NAD Eyes: PERRL, EOMI HEENT: no oropharyngeal swelling Neck: trachea midline Resp: clear to auscultation bilaterally Card: RRR, no murmurs, rubs, or gallops Abd: nontender, nondistended Extremities: no calf tenderness, no edema Vascular: 2+ radial pulses bilaterally, 2+ DP pulses bilaterally Neuro: Cranial nerves intact, equal strength and sensation throughout bilateral upper and lower extremities with no dysmetria on finger-to-nose  testing Skin: no rashes Psyc: acting appropriately   (all labs ordered are listed, but only abnormal results are displayed) Labs Reviewed  COMPREHENSIVE METABOLIC PANEL WITH GFR - Abnormal; Notable for the following components:      Result Value   Glucose, Bld 251 (*)    Creatinine, Ser 1.38 (*)    Total Protein 6.4 (*)    ALT 70 (*)    Alkaline Phosphatase 34 (*)    GFR, Estimated 54 (*)    All other components within normal limits  I-STAT CHEM 8, ED - Abnormal; Notable for the  following components:   BUN 24 (*)    Creatinine, Ser 1.40 (*)    Glucose, Bld 264 (*)    TCO2 21 (*)    All other components within normal limits  PROTIME-INR  APTT  CBC  DIFFERENTIAL  ETHANOL  CBG MONITORING, ED    EKG: EKG Interpretation Date/Time:  Sunday April 22 2024 15:38:28 EDT Ventricular Rate:  90 PR Interval:  208 QRS Duration:  143 QT Interval:  381 QTC Calculation: 467 R Axis:   12  Text Interpretation: Sinus rhythm Right bundle branch block Confirmed by Ula Barter [phone]) on 04/22/2024 3:43:42 PM  Radiology: MR BRAIN WO CONTRAST Result Date: 04/22/2024 CLINICAL DATA:  Initial evaluation for acute neuro deficit, stroke suspected. EXAM: MRI HEAD WITHOUT CONTRAST TECHNIQUE: Multiplanar, multiecho pulse sequences of the brain and surrounding structures were obtained without intravenous contrast. COMPARISON:  None Available. FINDINGS: Brain: Generalized age-related cerebral atrophy. Patchy T2/FLAIR hyperintensity involving the periventricular deep white matter both cerebral hemispheres, most characteristic of chronic microvascular ischemic disease, mild for age. Few associated small remote lacunar infarcts noted about the hemispheric cerebral white matter. No abnormal foci of restricted diffusion to suggest acute or subacute ischemia. Gray-white matter differentiation maintained. No areas of chronic cortical infarction. No acute or chronic intracranial blood products. No mass lesion, midline shift or mass effect. No hydrocephalus or extra-axial fluid collection. Pituitary gland within normal limits. Vascular: Major intracranial vascular flow voids are maintained. Skull and upper cervical spine: Craniocervical junction within normal limits. Bone marrow signal intensity overall within normal limits. No scalp soft tissue abnormality Sinuses/Orbits: Prior bilateral ocular lens replacement. Mild scattered mucosal thickening about the ethmoidal air cells and maxillary sinuses.  Paranasal sinuses are otherwise clear. No significant mastoid effusion. Other: None. IMPRESSION: 1. No acute intracranial abnormality. 2. Age-related cerebral atrophy with mild chronic small vessel ischemic disease. Electronically Signed   By: Morene Hoard M.D.   On: 04/22/2024 20:56     Procedures   Medications Ordered in the ED  sodium chloride  flush (NS) 0.9 % injection 3 mL (3 mLs Intravenous Given 04/22/24 1454)  iohexol (OMNIPAQUE) 350 MG/ML injection 75 mL (75 mLs Intravenous Contrast Given 04/22/24 2241)                                    Medical Decision Making 72 year old male with past medical history of diabetes and hyperlipidemia presenting to the emergency department today with some fatigue.  He denies any unilateral weakness but this was reported by medics.  I will further evaluate him here with basic labs as well as an MRI and keep patient with cardiac monitor.  Will reevaluate for ultimate disposition.  Does appear that he is back to his baseline so we will hold off on calling a code stroke unless his symptoms acutely worsen.  He  denies any headache and does not have any focal neurological deficits so we will skip the CT scan and go straight for MRI.  The patient's MRI is negative and labs are reassuring.  Patient remained stable here.  Discussed the patient's case with Dr.Khaliqdina recommend CT angiogram to evaluate for carotid stenosis.  This is ordered.  Results pending at time of signout.  Plan is for reevaluation after imaging studies and discussion with neurology.  Amount and/or Complexity of Data Reviewed Labs: ordered. Radiology: ordered.  Risk Prescription drug management.        Final diagnoses:  Postural dizziness with presyncope    ED Discharge Orders     None          Ula Prentice SAUNDERS, MD 04/22/24 2250

## 2024-04-22 NOTE — ED Notes (Signed)
 Pt taken to MRI

## 2024-04-23 NOTE — ED Notes (Signed)
  at bedside

## 2024-05-28 ENCOUNTER — Encounter: Payer: Self-pay | Admitting: Radiology

## 2024-06-05 ENCOUNTER — Encounter: Payer: Self-pay | Admitting: Family Medicine

## 2024-06-05 ENCOUNTER — Ambulatory Visit: Payer: Self-pay | Admitting: Family Medicine

## 2024-06-05 VITALS — BP 90/46 | HR 100 | Temp 98.0°F | Ht 74.0 in | Wt 281.6 lb

## 2024-06-05 DIAGNOSIS — Z794 Long term (current) use of insulin: Secondary | ICD-10-CM | POA: Diagnosis not present

## 2024-06-05 DIAGNOSIS — I6603 Occlusion and stenosis of bilateral middle cerebral arteries: Secondary | ICD-10-CM | POA: Diagnosis not present

## 2024-06-05 DIAGNOSIS — E1129 Type 2 diabetes mellitus with other diabetic kidney complication: Secondary | ICD-10-CM

## 2024-06-05 DIAGNOSIS — E785 Hyperlipidemia, unspecified: Secondary | ICD-10-CM | POA: Diagnosis not present

## 2024-06-05 DIAGNOSIS — R809 Proteinuria, unspecified: Secondary | ICD-10-CM

## 2024-06-05 DIAGNOSIS — E1169 Type 2 diabetes mellitus with other specified complication: Secondary | ICD-10-CM

## 2024-06-05 LAB — BAYER DCA HB A1C WAIVED: HB A1C (BAYER DCA - WAIVED): 8.5 % — ABNORMAL HIGH (ref 4.8–5.6)

## 2024-06-05 NOTE — Patient Instructions (Signed)
 Schedule diabetic eye exam. Use ketoconazole  wash for dermatitis of the eyelids as discussed. Monitor BP at home. Goal >100/50 but below 150/90. Referral to neurosurgery for the abnormalities found on your imaging in the ER.

## 2024-06-05 NOTE — Progress Notes (Signed)
 Subjective: CC:DM PCP: Jolinda Norene HERO, DO YEP:Rick Lane is a 73 y.o. male presenting to clinic today for:  Type 2 Diabetes with hypertension, hyperlipidemia:  Recently seen in ER for presyncope.  No evidence of CVA/ TIA but had Intracranial atherosclerotic disease, with moderate bilateral M1 stenoses, with additional mild-to-moderate left P1/P2 stenosis. His wife is present for today's visit and describes what sounds like strokelike symptoms.  He does have an appointment with neurology after the new year with Dr. Rosemarie.  He has had no recurrent symptoms.  Has persistent dermatitis around the orbits but never started the ketoconazole .  He reports occasional orthostasis when he gets up in the morning but otherwise does not have any issues with dizziness throughout the day.  No reports of chest pain, shortness of breath.  He does get some tearing of the right eye but has history of orbital fracture as a youth on that side and has chronic ptosis.   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 Then rinse.  Use  twice weekly until rash resolves., Disp: 120 mL, Rfl: 2   lisinopril  (ZESTRIL ) 5 MG tablet, Take 1 tablet (5 mg total) by mouth daily., Disp: 90 tablet, Rfl: 3   Multiple Vitamin (MULTIVITAMIN WITH MINERALS) TABS tablet, Take 1 tablet by mouth daily. Equate Multivitamin for Men 50+, Disp: , Rfl:    mupirocin  cream (BACTROBAN ) 2 %, Apply 1 Application topically 2 (two) times daily. To area of skin breakdown on right leg., Disp: 15 g, Rfl: 0   NOVOLOG  FLEXPEN 100 UNIT/ML FlexPen, Inject 10 Units into the skin 2 (two) times daily with breakfast and lunch AND 8-9 Units daily with supper., Disp: 15 mL, Rfl: 12   nystatin  cream (MYCOSTATIN ), Apply 1 Application topically 2 (two) times daily. X7-14 days to rash on chest., Disp: 30 g, Rfl: 0   rosuvastatin  (CRESTOR ) 40 MG tablet, Take 1 tablet (40 mg total) by mouth daily., Disp: 90 tablet, Rfl: 3   TRESIBA  FLEXTOUCH 200 UNIT/ML FlexTouch Pen, Inject 60 Units into the skin at bedtime., Disp: 45 mL, Rfl: 3   Vitamin D , Ergocalciferol , (DRISDOL ) 1.25 MG (50000 UNIT) CAPS capsule, Take 1 capsule (50,000 Units total) by mouth every 7 (seven) days., Disp: 12 capsule, Rfl: 3 Social History   Socioeconomic History   Marital status: Married    Spouse name: Not on file   Number of children: 0   Years of education: 70  Highest education level: Not on file  Occupational History   Occupation: magazine features editor    Comment: [employer]   Occupation: retired  Tobacco Use   Smoking status: Never   Smokeless tobacco: Never  Vaping Use   Vaping status: Never Used  Substance and Sexual Activity   Alcohol use: No   Drug use: No   Sexual activity: Not Currently    Birth control/protection: None  Other Topics Concern   Not on file  Social History Narrative   Not on file   Social Drivers of Health   Financial Resource Strain: Low Risk  (08/02/2023)   Overall Financial Resource Strain (CARDIA)    Difficulty of Paying Living Expenses: Not hard at all  Food Insecurity:  No Food Insecurity (08/02/2023)   Hunger Vital Sign    Worried About Running Out of Food in the Last Year: Never true    Ran Out of Food in the Last Year: Never true  Transportation Needs: No Transportation Needs (08/02/2023)   PRAPARE - Administrator, Civil Service (Medical): No    Lack of Transportation (Non-Medical): No  Physical Activity: Insufficiently Active (08/02/2023)   Exercise Vital Sign    Days of Exercise per Week: 3 days    Minutes of Exercise per Session: 30 min  Stress: No Stress Concern Present (08/02/2023)   [employer] of Occupational Health - Occupational Stress Questionnaire    Feeling of Stress : Not at all  Social Connections: Moderately Integrated (08/02/2023)   Social Connection and Isolation Panel    Frequency of Communication with Friends and Family: More than three times a week    Frequency of Social Gatherings with Friends and Family: Three times a week    Attends Religious Services: More than 4 times per year    Active Member of Clubs or Organizations: No    Attends Banker Meetings: Never    Marital Status: Married  Catering Manager Violence: Not At Risk (08/02/2023)   Humiliation, Afraid, Rape, and Kick questionnaire    Fear of Current or Ex-Partner: No    Emotionally Abused: No    Physically Abused: No    Sexually Abused: No   Family History  Problem Relation Age of Onset   Cirrhosis Mother        secondary to alcohol abuse.    Heart disease Father    Diabetes Sister    Diabetes Brother    Aneurysm Brother        brain   Colon cancer Maternal Grandfather        passed in the mid-60's.   Heart attack Paternal Grandfather     Objective: Office vital signs reviewed. BP (!) 68/45   Pulse 100   Temp 98 F (36.7 C)   Ht 6' 2 (1.88 m)   Wt 281 lb 9.6 oz (127.7 kg)   SpO2 93%   BMI 36.16 kg/m   Physical Examination:  General: Awake, alert, morbidly obese, No acute distress HEENT: Periorbital dermatitis bilaterally  with right sided ptosis of the lower lid. 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  Left lower extremity with questionable foot drop   Lab Results  Component Value Date   HGBA1C 7.3 (H) 02/28/2024    Assessment/ Plan: 73 y.o. 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  TIA related to cerebral artery stenosis?  I have referred him to neurosurgery for further discussion and intervention if needed.  He has appointment with neurology after the new year.    Blood pressure noted to be quite low compared to his check in August.  This was repeated with manual blood pressure still low.  Encouraged him to consider ER for IV hydration as this is definitely outside of his normal.  He admitted to poorly hydrating in general today and in fact had not consumed anything except for coffee this morning.  He and his wife requested doing home p.o. hydration with frequent blood pressure monitoring at home.  Will evaluate him again in 24 hours with nurse.  However, discussed if he develops any red flag signs or symptoms he is to seek immediate medical attention in the ER as he will need IV fluids.  We discussed red flag signs and symptoms warranting reevaluation in the office.  Norene CHRISTELLA Fielding, DO [facility] [phone]

## 2024-06-06 ENCOUNTER — Ambulatory Visit

## 2024-06-06 ENCOUNTER — Ambulatory Visit: Payer: Self-pay | Admitting: Family Medicine

## 2024-06-06 DIAGNOSIS — N289 Disorder of kidney and ureter, unspecified: Secondary | ICD-10-CM

## 2024-06-06 LAB — CMP14+EGFR
ALT: 67 IU/L — ABNORMAL HIGH (ref 0–44)
AST: 40 IU/L (ref 0–40)
Albumin: 4.5 g/dL (ref 3.8–4.8)
Alkaline Phosphatase: 46 IU/L — ABNORMAL LOW (ref 47–123)
BUN/Creatinine Ratio: 18 (ref 10–24)
BUN: 24 mg/dL (ref 8–27)
Bilirubin Total: 0.5 mg/dL (ref 0.0–1.2)
CO2: 23 mmol/L (ref 20–29)
Calcium: 10.6 mg/dL — ABNORMAL HIGH (ref 8.6–10.2)
Chloride: 99 mmol/L (ref 96–106)
Creatinine, Ser: 1.34 mg/dL — ABNORMAL HIGH (ref 0.76–1.27)
Globulin, Total: 2.1 g/dL (ref 1.5–4.5)
Glucose: 253 mg/dL — ABNORMAL HIGH (ref 70–99)
Potassium: 4.5 mmol/L (ref 3.5–5.2)
Sodium: 136 mmol/L (ref 134–144)
Total Protein: 6.6 g/dL (ref 6.0–8.5)
eGFR: 56 mL/min/1.73 — ABNORMAL LOW (ref >59)

## 2024-06-06 LAB — LDL CHOLESTEROL, DIRECT: LDL Direct: 142 mg/dL — ABNORMAL HIGH (ref 0–99)

## 2024-06-06 NOTE — Progress Notes (Signed)
 Patient is in office today for a nurse visit for Blood Pressure Check.   B/P 164/92 HR 76 B/P 154/88 HR 79  Dr Jolinda made aware of results. Pt scheduled for 1 week follow up for repeat bp check per Dr KANDICE recommendations.

## 2024-06-13 ENCOUNTER — Ambulatory Visit (INDEPENDENT_AMBULATORY_CARE_PROVIDER_SITE_OTHER): Admitting: *Deleted

## 2024-06-13 DIAGNOSIS — R030 Elevated blood-pressure reading, without diagnosis of hypertension: Secondary | ICD-10-CM

## 2024-06-13 NOTE — Progress Notes (Signed)
 Patient is in office today for a nurse visit for Blood Pressure Check.   Blood pressure readings:  151/78 right arm large cuff   160/67 right arm large cuff

## 2024-06-14 DIAGNOSIS — S98139A Complete traumatic amputation of one unspecified lesser toe, initial encounter: Secondary | ICD-10-CM | POA: Insufficient documentation

## 2024-06-14 DIAGNOSIS — I10 Essential (primary) hypertension: Secondary | ICD-10-CM | POA: Insufficient documentation

## 2024-06-14 DIAGNOSIS — E669 Obesity, unspecified: Secondary | ICD-10-CM | POA: Insufficient documentation

## 2024-06-14 DIAGNOSIS — I872 Venous insufficiency (chronic) (peripheral): Secondary | ICD-10-CM | POA: Insufficient documentation

## 2024-06-14 DIAGNOSIS — E119 Type 2 diabetes mellitus without complications: Secondary | ICD-10-CM | POA: Insufficient documentation

## 2024-06-27 ENCOUNTER — Ambulatory Visit: Admitting: Neuroradiology

## 2024-06-27 ENCOUNTER — Encounter: Payer: Self-pay | Admitting: Neuroradiology

## 2024-06-27 VITALS — BP 122/66 | HR 86 | Temp 97.7°F | Ht 74.0 in

## 2024-06-27 DIAGNOSIS — G459 Transient cerebral ischemic attack, unspecified: Secondary | ICD-10-CM | POA: Diagnosis not present

## 2024-06-27 DIAGNOSIS — I6603 Occlusion and stenosis of bilateral middle cerebral arteries: Secondary | ICD-10-CM

## 2024-06-27 DIAGNOSIS — I672 Cerebral atherosclerosis: Secondary | ICD-10-CM

## 2024-06-27 DIAGNOSIS — E785 Hyperlipidemia, unspecified: Secondary | ICD-10-CM

## 2024-06-27 DIAGNOSIS — I6623 Occlusion and stenosis of bilateral posterior cerebral arteries: Secondary | ICD-10-CM

## 2024-06-27 DIAGNOSIS — L219 Seborrheic dermatitis, unspecified: Secondary | ICD-10-CM

## 2024-06-27 NOTE — Progress Notes (Signed)
 I had the pleasure of seeing Rick Lane in the office today.  He was accompanied by his wife Rock.  Briefly, at the end of September, he had a episode of transient language abnormality followed by left-sided weakness while they were eating out.  The symptoms resolved while he was in the ambulance.  He is left-handed.  He had a brain MRI 04/22/2024 which was negative.  I reviewed that study.  He had a CT arteriogram 04/22/2024 which demonstrated bilateral middle cerebral artery and posterior cerebral artery stenoses.  There was carotid plaque but no significant carotid stenosis.  He was not in atrial fibrillation.  He had normal sinus rhythm.  His comorbid conditions include insulin -dependent diabetes, dyslipidemia.  He does not smoke.  He is not hypertensive.  I reviewed his medications which include rosuvastatin  40 mg and his insulin .  He was not on any antiplatelet therapy.  He says that he was not prescribed any when he left the hospital.  He has fortunately not had any additional events.  Assessment:  Intracranial atherosclerosis with bilateral middle cerebral artery and posterior cerebral artery stenoses.  Transient ischemic attack in the right hemisphere 04/22/2024 without recurrence.  I do not see that he had a echocardiogram.  Recommendation:  1.  Add 81 mg aspirin .  This should be continued indefinitely unless he develops a contraindication. 2.  Continue medical management of vascular risk factors of diabetes and dyslipidemia 3.  No routine imaging follow-up needed of his cerebral vasculature 4.  I would recommend an echocardiogram if he has not already had one.  I do not see 1 in his imaging and we will go ahead and order that.

## 2024-07-05 ENCOUNTER — Telehealth: Payer: Self-pay

## 2024-07-05 NOTE — Addendum Note (Signed)
 Addended by: Harlin Mazzoni on: 07/05/2024 10:57 AM   Modules accepted: Orders

## 2024-07-05 NOTE — Addendum Note (Signed)
 Addended by: Sherleen Pangborn on: 07/05/2024 09:13 AM   Modules accepted: Orders

## 2024-07-05 NOTE — Telephone Encounter (Signed)
 Patient's wife called and requested the ECHO be scheduled in [place]. I switched the preferred imaging location to [place] so that it can be scheduled there.

## 2024-07-12 DIAGNOSIS — E1122 Type 2 diabetes mellitus with diabetic chronic kidney disease: Secondary | ICD-10-CM | POA: Diagnosis not present

## 2024-07-12 DIAGNOSIS — N1831 Chronic kidney disease, stage 3a: Secondary | ICD-10-CM | POA: Diagnosis not present

## 2024-07-12 DIAGNOSIS — R809 Proteinuria, unspecified: Secondary | ICD-10-CM | POA: Diagnosis not present

## 2024-07-12 DIAGNOSIS — I129 Hypertensive chronic kidney disease with stage 1 through stage 4 chronic kidney disease, or unspecified chronic kidney disease: Secondary | ICD-10-CM | POA: Diagnosis not present

## 2024-07-12 DIAGNOSIS — E559 Vitamin D deficiency, unspecified: Secondary | ICD-10-CM | POA: Diagnosis not present

## 2024-07-16 ENCOUNTER — Telehealth: Payer: Self-pay

## 2024-07-16 ENCOUNTER — Telehealth: Payer: Self-pay | Admitting: Orthopedic Surgery

## 2024-07-16 NOTE — Telephone Encounter (Signed)
 This is a duplicate message.

## 2024-07-16 NOTE — Telephone Encounter (Signed)
 I SW pt's wife. She says that she has been keeping mupirocin  ointment on toe. Keeping it clean and wrapping foot. I advised yes, to please continue that. Monitor area. If he develops any fever, chills, N&V, increased pain and redness to go to the ER to get checked out. If she has any others questions or concerns she can call us  too even after hours.

## 2024-07-16 NOTE — Telephone Encounter (Signed)
 Made pt appt for 12/29 with [facility]. Suggested wife also asked to speak to triage. Pt bleeding in between big and second toe. Pt number is [phone].

## 2024-07-16 NOTE — Telephone Encounter (Signed)
 Patients wife Rock called concerning patients right foot being red, raw, and bleeding in between 2 of his toes.  Appt. Has been scheduled, but would like a call back to discuss.  CB# [phone].  Please advise.

## 2024-07-23 ENCOUNTER — Encounter: Payer: Self-pay | Admitting: Physician Assistant

## 2024-07-23 ENCOUNTER — Ambulatory Visit: Admitting: Physician Assistant

## 2024-07-23 DIAGNOSIS — S91109A Unspecified open wound of unspecified toe(s) without damage to nail, initial encounter: Secondary | ICD-10-CM

## 2024-07-23 DIAGNOSIS — S91102A Unspecified open wound of left great toe without damage to nail, initial encounter: Secondary | ICD-10-CM

## 2024-07-23 MED ORDER — DOXYCYCLINE HYCLATE 100 MG PO TABS: 100.0000 mg | ORAL_TABLET | Freq: Two times a day (BID) | ORAL | 0 refills | Status: AC

## 2024-07-23 NOTE — Progress Notes (Signed)
 "  Office Visit Note   Patient: Rick Lane           Date of Birth: 28-Nov-1950           MRN: [id number] Visit Date: 07/23/2024              Requested by: Jolinda Norene HERO, DO [address] [place],  [place] [address] PCP: Jolinda Norene HERO, DO  No chief complaint on file.     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  He is here today with a new complaint of left foot blisters on the first, second and third toes.  The blisters have drained on there own.  He denies shoe wear changes, new socks or detergents.  The blisters started a week ago.    Past medical history includes: DM and CKD.    Assessment & Plan: Visit Diagnoses:  1. Open toe wound, initial encounter     Plan: The raw areas were cleaned with Vashe.  Vive sleeve was cut and pieces were placed over the raw skin between the toes.  Clean with Vashe 1-2 times a day.  Check skin frequently.    Shower with soap and water daily, elevate above the heart multiple times a day to decrease swelling and SS drainage.    Follow-Up Instructions: Return in about 1 week (around 07/30/2024).   Ortho Exam  Patient is alert, oriented, no adenopathy, well-dressed, normal affect, normal respiratory effort. Plantar first GT superficial raw tissue, between the GT and second toe as well as the second and third web space superficial skin break down.  Minimal erythema no black eschar or purulent drainage.  Plantar third toe pad beefy red.  Palpable DP pulse.      Imaging: No results found. No images are attached to the encounter.  Labs: Lab Results  Component Value Date   HGBA1C 8.5 (H) 06/05/2024   HGBA1C 7.3 (H) 02/28/2024   HGBA1C 6.7 (H) 07/25/2023     Lab Results  Component Value Date   ALBUMIN 4.5 06/05/2024   ALBUMIN 3.7 04/22/2024   ALBUMIN 4.8 02/28/2024    No results found for: MG Lab Results  Component Value Date   VD25OH 18.4 (L) 02/28/2024   VD25OH 37.4 04/28/2017     No results found for: PREALBUMIN    Latest Ref Rng & Units 04/22/2024    3:02 PM 04/22/2024    2:44 PM 02/28/2024   11:52 AM  CBC EXTENDED  WBC 4.0 - 10.5 K/uL  5.3  5.3   RBC 4.22 - 5.81 MIL/uL  4.51  4.85   Hemoglobin 13.0 - 17.0 g/dL 86.3  85.7  84.6   HCT 39.0 - 52.0 % 40.0  42.1  45.3   Platelets 150 - 400 K/uL  169  202   NEUT# 1.7 - 7.7 K/uL  3.5    Lymph# 0.7 - 4.0 K/uL  1.1       There is no height or weight on file to calculate BMI.  Orders:  No orders of the defined types were placed in this encounter.  Meds ordered this encounter  Medications   doxycycline  (VIBRA -TABS) 100 MG tablet    Sig: Take 1 tablet (100 mg total) by mouth 2 (two) times daily.    Dispense:  28 tablet    Refill:  0    Supervising Provider:   DUDA, MARCUS V [[id number]]     Procedures: No procedures  performed  Clinical Data: No additional findings.  ROS:  All other systems negative, except as noted in the HPI. Review of Systems  Objective: Vital Signs: There were no vitals taken for this visit.  Specialty Comments:  No specialty comments available.  PMFS History: Patient Active Problem List   Diagnosis Date Noted   Amputated toe 06/14/2024   Stasis dermatitis 06/14/2024   Type 2 diabetes mellitus without complications (HCC) 06/14/2024   Obesity 06/14/2024   Essential hypertension 06/14/2024   CKD (chronic kidney disease) stage 2, GFR 60-89 ml/min 06/01/2022   PAD (peripheral artery disease) 02/12/2022   Hepatic steatosis 12/15/2020   Benign prostatic hyperplasia with urinary obstruction 08/06/2020   Type 2 diabetes mellitus with microalbuminuria, with long-term current use of insulin  (HCC) 03/27/2019   Influenza vaccine refused 05/29/2018   DOE (dyspnea on exertion) 11/25/2016   Acquired absence of organ 03/08/2016   Abnormal gait 03/08/2016   Incoordination 03/08/2016   Moderate recurrent major depression (HCC) 03/08/2016   Unsteadiness on feet 03/08/2016   Type 2  diabetes mellitus (HCC) 03/08/2016   Hyperlipidemia 03/08/2016   S/P unilateral BKA (below knee amputation), right (HCC) 12/12/2015   Hyperlipidemia associated with type 2 diabetes mellitus (HCC) 11/14/2015   Episodic mood disorder 08/11/2015   Vitamin D  deficiency 08/11/2015   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    Heart disease Father    Diabetes Sister    Diabetes Brother    Aneurysm Brother        brain   Colon cancer Maternal Grandfather        passed in the mid-60's.   Heart attack Paternal Grandfather     Past Surgical History:  Procedure Laterality Date   AMPUTATION Right 12/12/2015   Procedure: Right Transmetatarsal Amputation With [facility] Placement;  Surgeon: Jerona Harden GAILS, MD;  Location: [facility] OR;  Service: Orthopedics;  Laterality: Right;   AMPUTATION Right 03/05/2016   Procedure: RIGHT BELOW KNEE AMPUTATION;  Surgeon: Jerona GAILS Harden, MD;  Location: [facility] OR;  Service: Orthopedics;  Laterality: Right;   CATARACT EXTRACTION W/PHACO Right 10/02/2018   Procedure: CATARACT EXTRACTION PHACO AND INTRAOCULAR LENS PLACEMENT (IOC);  Surgeon: Harrie Agent, MD;  Location: [facility] ORS;  Service: Ophthalmology;  Laterality: Right;  CDE: 130.53   COLONOSCOPY     COLONOSCOPY N/A 12/19/2019   Procedure: COLONOSCOPY;  Surgeon: Shaaron Lamar HERO, MD;  Diverticulosis in the sigmoid and descending colon, redundant colon, otherwise normal exam.    EYE SURGERY Left    Cataract   EYE SURGERY Right    r   ORBITAL RECONSTRUCTION Right    STUMP REVISION Right 11/10/2016   Procedure: REVISION RIGHT BELOW  KNEE AMPUTATION;  Surgeon: Jerona Harden GAILS, MD;  Location: [facility] OR;  Service: Orthopedics;  Laterality: Right;   toe removal Right 2012,2014   Social History   Occupational History   Occupation: magazine features editor    Comment: [employer]   Occupation: retired  Tobacco Use  Smoking status: Never   Smokeless tobacco: Never  Vaping Use   Vaping status: Never Used  Substance and Sexual Activity   Alcohol use: No   Drug use: No   Sexual activity: Not Currently    Birth control/protection: None       "

## 2024-07-24 ENCOUNTER — Ambulatory Visit: Attending: Neuroradiology

## 2024-07-24 DIAGNOSIS — G459 Transient cerebral ischemic attack, unspecified: Secondary | ICD-10-CM | POA: Diagnosis not present

## 2024-07-24 LAB — ECHOCARDIOGRAM COMPLETE
AR max vel: 1.77 cm2
AV Area VTI: 1.82 cm2
AV Area mean vel: 1.64 cm2
AV Mean grad: 6 mmHg
AV Peak grad: 9.9 mmHg
Ao pk vel: 1.57 m/s
Area-P 1/2: 2.6 cm2
Calc EF: 51.4 %
MV VTI: 1.86 cm2
S' Lateral: 3.5 cm
Single Plane A2C EF: 51.1 %
Single Plane A4C EF: 49.1 %

## 2024-07-24 MED ORDER — PERFLUTREN LIPID MICROSPHERE
1.0000 mL | INTRAVENOUS | Status: AC | PRN
  Administered 2024-07-24: 4.5 mL via INTRAVENOUS

## 2024-07-29 ENCOUNTER — Other Ambulatory Visit: Payer: Self-pay | Admitting: Family Medicine

## 2024-07-29 DIAGNOSIS — E1129 Type 2 diabetes mellitus with other diabetic kidney complication: Secondary | ICD-10-CM

## 2024-07-30 ENCOUNTER — Encounter: Payer: Self-pay | Admitting: Physician Assistant

## 2024-07-30 ENCOUNTER — Ambulatory Visit: Admitting: Physician Assistant

## 2024-07-30 DIAGNOSIS — S91109A Unspecified open wound of unspecified toe(s) without damage to nail, initial encounter: Secondary | ICD-10-CM

## 2024-07-30 DIAGNOSIS — S91105A Unspecified open wound of left lesser toe(s) without damage to nail, initial encounter: Secondary | ICD-10-CM | POA: Diagnosis not present

## 2024-07-30 NOTE — Progress Notes (Signed)
 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 He is here today to follow up on left foot blisters on the first, second and third toes.  He has been using pieces of Vive material to weave between his toes and Vashe cleaning daily.    Assessment & Plan: Visit Diagnoses:  1. Open toe wound, initial encounter     Plan: Healing wounds on the toes.  Palpable DP pulse. The wound areas were cleaned with Vashe.  Vive sleeve was cut and pieces were placed over the raw skin between the toes.  Clean with Vashe 1-2 times a day.  Check skin frequently.  Elevate when at rest.  He was given a hand out to demonstrate elevation.               Shower with soap and water daily, elevate above the heart multiple times a day to decrease swelling and SS drainage.   Follow-Up Instructions: Return in about 15 days (around 08/14/2024).   Ortho Exam  Patient is alert, oriented, no adenopathy, well-dressed, normal affect, normal respiratory effort. Plantar first GT superficial wound, between the GT and second toe as well as the second and third web space superficial skin break down.  No active drainage, improved edema and palpable DP pulse.  Cap refill is brisk.  The wounds are dry.      Imaging:      Labs: Lab Results  Component Value Date   HGBA1C 8.5 (H) 06/05/2024   HGBA1C 7.3 (H) 02/28/2024   HGBA1C 6.7 (H) 07/25/2023     Lab Results  Component Value Date   ALBUMIN 4.5 06/05/2024   ALBUMIN 3.7 04/22/2024   ALBUMIN 4.8 02/28/2024    No results found for: MG Lab Results  Component Value Date   VD25OH 18.4 (L) 02/28/2024    VD25OH 37.4 04/28/2017    No results found for: PREALBUMIN    Latest Ref Rng & Units 04/22/2024    3:02 PM 04/22/2024    2:44 PM 02/28/2024   11:52 AM  CBC EXTENDED  WBC 4.0 - 10.5 K/uL  5.3  5.3   RBC 4.22 - 5.81 MIL/uL  4.51  4.85   Hemoglobin 13.0 - 17.0 g/dL 86.3  85.7  84.6   HCT 39.0 - 52.0 % 40.0  42.1  45.3   Platelets 150 - 400 K/uL  169  202   NEUT# 1.7 - 7.7 K/uL  3.5    Lymph# 0.7 - 4.0 K/uL  1.1       There is no height or weight on file to calculate BMI.  Orders:  No orders of the defined types were placed in this encounter.  No orders of the defined types were placed in this encounter.    Procedures: No procedures performed  Clinical Data: No additional findings.  ROS:  All other systems negative, except as noted in the HPI. Review of Systems  Objective: Vital  Signs: There were no vitals taken for this visit.  Specialty Comments:  No specialty comments available.  PMFS History: Patient Active Problem List   Diagnosis Date Noted   Amputated toe 06/14/2024   Stasis dermatitis 06/14/2024   Type 2 diabetes mellitus without complications (HCC) 06/14/2024   Obesity 06/14/2024   Essential hypertension 06/14/2024   CKD (chronic kidney disease) stage 2, GFR 60-89 ml/min 06/01/2022   PAD (peripheral artery disease) 02/12/2022   Hepatic steatosis 12/15/2020   Benign prostatic hyperplasia with urinary obstruction 08/06/2020   Type 2 diabetes mellitus with microalbuminuria, with long-term current use of insulin  (HCC) 03/27/2019   Influenza vaccine refused 05/29/2018   DOE (dyspnea on exertion) 11/25/2016   Acquired absence of organ 03/08/2016   Abnormal gait 03/08/2016   Incoordination 03/08/2016   Moderate recurrent major depression (HCC) 03/08/2016   Unsteadiness on feet 03/08/2016   Type 2 diabetes mellitus (HCC) 03/08/2016   Hyperlipidemia 03/08/2016   S/P unilateral BKA (below knee amputation), right (HCC) 12/12/2015   Hyperlipidemia associated  with type 2 diabetes mellitus (HCC) 11/14/2015   Episodic mood disorder 08/11/2015   Vitamin D  deficiency 08/11/2015   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    Heart disease Father    Diabetes Sister    Diabetes Brother    Aneurysm Brother        brain   Colon cancer Maternal Grandfather        passed in the mid-60's.   Heart attack Paternal Grandfather     Past Surgical History:  Procedure Laterality Date   AMPUTATION Right 12/12/2015   Procedure: Right Transmetatarsal Amputation With [facility] Placement;  Surgeon: Jerona Harden GAILS, MD;  Location: [facility] OR;  Service: Orthopedics;  Laterality: Right;   AMPUTATION Right 03/05/2016   Procedure: RIGHT BELOW KNEE AMPUTATION;  Surgeon: Jerona GAILS Harden, MD;  Location: [facility] OR;  Service: Orthopedics;  Laterality: Right;   CATARACT EXTRACTION W/PHACO Right 10/02/2018   Procedure: CATARACT EXTRACTION PHACO AND INTRAOCULAR LENS PLACEMENT (IOC);  Surgeon: Harrie Agent, MD;  Location: [facility] ORS;  Service: Ophthalmology;  Laterality: Right;  CDE: 130.53   COLONOSCOPY     COLONOSCOPY N/A 12/19/2019   Procedure: COLONOSCOPY;  Surgeon: Shaaron Lamar HERO, MD;  Diverticulosis in the sigmoid and descending colon, redundant colon, otherwise normal exam.    EYE SURGERY Left    Cataract   EYE SURGERY Right    r   ORBITAL RECONSTRUCTION Right    STUMP REVISION Right 11/10/2016   Procedure: REVISION RIGHT BELOW KNEE AMPUTATION;  Surgeon: Jerona Harden GAILS, MD;  Location: [facility] OR;  Service: Orthopedics;  Laterality: Right;   toe removal Right 2012,2014   Social History    Occupational History   Occupation: magazine features editor    Comment: [employer]   Occupation: retired  Tobacco Use   Smoking status: Never   Smokeless tobacco: Never  Vaping Use   Vaping status: Never Used  Substance and Sexual Activity   Alcohol  use: No   Drug use: No   Sexual activity: Not Currently    Birth control/protection: None       "

## 2024-08-01 ENCOUNTER — Other Ambulatory Visit (HOSPITAL_COMMUNITY): Payer: Self-pay

## 2024-08-03 ENCOUNTER — Other Ambulatory Visit: Payer: Self-pay | Admitting: Family Medicine

## 2024-08-03 DIAGNOSIS — E1129 Type 2 diabetes mellitus with other diabetic kidney complication: Secondary | ICD-10-CM

## 2024-08-06 ENCOUNTER — Other Ambulatory Visit: Payer: Self-pay | Admitting: Family Medicine

## 2024-08-06 DIAGNOSIS — E1129 Type 2 diabetes mellitus with other diabetic kidney complication: Secondary | ICD-10-CM

## 2024-08-06 MED ORDER — TOUJEO MAX SOLOSTAR 300 UNIT/ML ~~LOC~~ SOPN: 60.0000 [IU] | PEN_INJECTOR | Freq: Every day | SUBCUTANEOUS | 4 refills | Status: AC

## 2024-08-14 ENCOUNTER — Encounter: Payer: Self-pay | Admitting: Neurology

## 2024-08-14 ENCOUNTER — Ambulatory Visit: Admitting: Neurology

## 2024-08-14 ENCOUNTER — Ambulatory Visit: Admitting: Physician Assistant

## 2024-08-14 VITALS — BP 148/76 | HR 79 | Ht 74.0 in | Wt 288.2 lb

## 2024-08-14 DIAGNOSIS — Z794 Long term (current) use of insulin: Secondary | ICD-10-CM | POA: Diagnosis not present

## 2024-08-14 DIAGNOSIS — I669 Occlusion and stenosis of unspecified cerebral artery: Secondary | ICD-10-CM | POA: Diagnosis not present

## 2024-08-14 DIAGNOSIS — K760 Fatty (change of) liver, not elsewhere classified: Secondary | ICD-10-CM

## 2024-08-14 DIAGNOSIS — R29898 Other symptoms and signs involving the musculoskeletal system: Secondary | ICD-10-CM | POA: Diagnosis not present

## 2024-08-14 DIAGNOSIS — R55 Syncope and collapse: Secondary | ICD-10-CM | POA: Diagnosis not present

## 2024-08-14 DIAGNOSIS — E1169 Type 2 diabetes mellitus with other specified complication: Secondary | ICD-10-CM

## 2024-08-14 DIAGNOSIS — E785 Hyperlipidemia, unspecified: Secondary | ICD-10-CM | POA: Diagnosis not present

## 2024-08-14 DIAGNOSIS — S91309A Unspecified open wound, unspecified foot, initial encounter: Secondary | ICD-10-CM

## 2024-08-14 DIAGNOSIS — S91302A Unspecified open wound, left foot, initial encounter: Secondary | ICD-10-CM | POA: Diagnosis not present

## 2024-08-14 DIAGNOSIS — G459 Transient cerebral ischemic attack, unspecified: Secondary | ICD-10-CM

## 2024-08-14 DIAGNOSIS — Z9189 Other specified personal risk factors, not elsewhere classified: Secondary | ICD-10-CM

## 2024-08-14 MED ORDER — ASPIRIN 81 MG PO TBEC: 81.0000 mg | DELAYED_RELEASE_TABLET | Freq: Every day | ORAL | 12 refills | Status: AC

## 2024-08-14 MED ORDER — ROSUVASTATIN CALCIUM 40 MG PO TABS: 40.0000 mg | ORAL_TABLET | Freq: Every day | ORAL | 3 refills | Status: AC

## 2024-08-14 MED ORDER — NITROGLYCERIN 0.2 MG/HR TD PT24: 0.2000 mg | MEDICATED_PATCH | Freq: Every day | TRANSDERMAL | 12 refills | Status: AC

## 2024-08-14 NOTE — Patient Instructions (Signed)
 I had a long discussion with the patient and his wife regarding episode of brief loss of consciousness and left-sided weakness likely representing a syncopal episode with a TIA on the background of intracranial stenosis.  I recommend continue aspirin  81 mg daily for stroke prevention and maintain aggressive risk factor modification with strict control of hypertension with blood pressure goal below 130/90, lipids with LDL cholesterol goal below 70 mg percent and diabetes with hemoglobin A1c goal below 6.5%.  I also encouraged him to eat a healthy diet with lots of fruits, vegetables, cereals and whole grains and to be active and exercise regularly and lose weight .he also appears to be at risk for sleep apnea and I recommended referral for polysomnogram.  Check EEG for seizure activity and 30-day heart monitor for paroxysmal A-fib.  Return for follow-up in the future in 6 to 8 months for follow-up if necessary.  Stroke Prevention Some medical conditions and behaviors can lead to a higher chance of having a stroke. You can help prevent a stroke by eating healthy, exercising, not smoking, and managing any medical conditions you have. Stroke is a leading cause of functional impairment. Primary prevention is particularly important because a majority of strokes are first-time events. Stroke changes the lives of not only those who experience a stroke but also their family and other caregivers. How can this condition affect me? A stroke is a medical emergency and should be treated right away. A stroke can lead to brain damage and can sometimes be life-threatening. If a person gets medical treatment right away, there is a better chance of surviving and recovering from a stroke. What can increase my risk? The following medical conditions may increase your risk of a stroke: Cardiovascular disease. High blood pressure (hypertension). Diabetes. High cholesterol. Sickle cell disease. Blood clotting disorders  (hypercoagulable state). Obesity. Sleep disorders (obstructive sleep apnea). Other risk factors include: Being older than age 6. Having a history of blood clots, stroke, or mini-stroke (transient ischemic attack, TIA). Genetic factors, such as race, ethnicity, or a family history of stroke. Smoking cigarettes or using other tobacco products. Taking birth control pills, especially if you also use tobacco. Heavy use of alcohol or drugs, especially cocaine and methamphetamine. Physical inactivity. What actions can I take to prevent this? Manage your health conditions High cholesterol levels. Eating a healthy diet is important for preventing high cholesterol. If cholesterol cannot be managed through diet alone, you may need to take medicines. Take any prescribed medicines to control your cholesterol as told by your health care provider. Hypertension. To reduce your risk of stroke, try to keep your blood pressure below 130/80. Eating a healthy diet and exercising regularly are important for controlling blood pressure. If these steps are not enough to manage your blood pressure, you may need to take medicines. Take any prescribed medicines to control hypertension as told by your health care provider. Ask your health care provider if you should monitor your blood pressure at home. Have your blood pressure checked every year, even if your blood pressure is normal. Blood pressure increases with age and some medical conditions. Diabetes. Eating a healthy diet and exercising regularly are important parts of managing your blood sugar (glucose). If your blood sugar cannot be managed through diet and exercise, you may need to take medicines. Take any prescribed medicines to control your diabetes as told by your health care provider. Get evaluated for obstructive sleep apnea. Talk to your health care provider about getting a sleep evaluation  if you snore a lot or have excessive sleepiness. Make sure that  any other medical conditions you have, such as atrial fibrillation or atherosclerosis, are managed. Nutrition Follow instructions from your health care provider about what to eat or drink to help manage your health condition. These instructions may include: Reducing your daily calorie intake. Limiting how much salt (sodium) you use to 1,500 milligrams (mg) each day. Using only healthy fats for cooking, such as olive oil, canola oil, or sunflower oil. Eating healthy foods. You can do this by: Choosing foods that are high in fiber, such as whole grains, and fresh fruits and vegetables. Eating at least 5 servings of fruits and vegetables a day. Try to fill one-half of your plate with fruits and vegetables at each meal. Choosing lean protein foods, such as lean cuts of meat, poultry without skin, fish, tofu, beans, and nuts. Eating low-fat dairy products. Avoiding foods that are high in sodium. This can help lower blood pressure. Avoiding foods that have saturated fat, trans fat, and cholesterol. This can help prevent high cholesterol. Avoiding processed and prepared foods. Counting your daily carbohydrate intake.  Lifestyle If you drink alcohol: Limit how much you have to: 0-1 drink a day for women who are not pregnant. 0-2 drinks a day for men. Know how much alcohol is in your drink. In the [place] one drink equals one 12 oz bottle of beer ( ), one 5 oz glass of wine ( ), or one 1 oz glass of hard liquor (44mL). Do not use any products that contain nicotine or tobacco. These products include cigarettes, chewing tobacco, and vaping devices, such as e-cigarettes. If you need help quitting, ask your health care provider. Avoid secondhand smoke. Do not use drugs. Activity  Try to stay at a healthy weight. Get at least 30 minutes of exercise on most days, such as: Fast walking. Biking. Swimming. Medicines Take over-the-counter and prescription medicines only as told by your health  care provider. Aspirin  or blood thinners (antiplatelets or anticoagulants) may be recommended to reduce your risk of forming blood clots that can lead to stroke. Avoid taking birth control pills. Talk to your health care provider about the risks of taking birth control pills if: You are over 76 years old. You smoke. You get very bad headaches. You have had a blood clot. Where to find more information American Stroke Association: www.strokeassociation.org Get help right away if: You or a loved one has any symptoms of a stroke. BE FAST is an easy way to remember the main warning signs of a stroke: B - Balance. Signs are dizziness, sudden trouble walking, or loss of balance. E - Eyes. Signs are trouble seeing or a sudden change in vision. F - Face. Signs are sudden weakness or numbness of the face, or the face or eyelid drooping on one side. A - Arms. Signs are weakness or numbness in an arm. This happens suddenly and usually on one side of the body. S - Speech. Signs are sudden trouble speaking, slurred speech, or trouble understanding what people say. T - Time. Time to call emergency services. Write down what time symptoms started. You or a loved one has other signs of a stroke, such as: A sudden, severe headache with no known cause. Nausea or vomiting. Seizure. These symptoms may represent a serious problem that is an emergency. Do not wait to see if the symptoms will go away. Get medical help right away. Call your local emergency services (911 in the [place]  Do not drive yourself to the hospital. Summary You can help to prevent a stroke by eating healthy, exercising, not smoking, limiting alcohol intake, and managing any medical conditions you may have. Do not use any products that contain nicotine or tobacco. These include cigarettes, chewing tobacco, and vaping devices, such as e-cigarettes. If you need help quitting, ask your health care provider. Remember BE FAST for warning signs of  a stroke. Get help right away if you or a loved one has any of these signs. This information is not intended to replace advice given to you by your health care provider. Make sure you discuss any questions you have with your health care provider. Document Revised: 06/14/2022 Document Reviewed: 06/14/2022 Elsevier Patient Education  2024 [employer].

## 2024-08-14 NOTE — Progress Notes (Unsigned)
 "  Office Visit Note   Patient: Rick Lane           Date of Birth: [birth year]           MRN: [id number] Visit Date: 08/14/2024              Requested by: Jolinda Norene HERO, DO [address] 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 He is here today to follow up on left foot blisters on the first, second and third toes.  He has been using pieces of Vive material to weave between his toes and Vashe cleaning daily, elevation and washing with soap and water.   Assessment & Plan: Visit Diagnoses: No diagnosis found.  Plan: ***  Follow-Up Instructions: No follow-ups on file.   Ortho Exam  Patient is alert, oriented, no adenopathy, well-dressed, normal affect, normal respiratory effort. ***    Imaging: No results found. No images are attached to the encounter.  Labs: Lab Results  Component Value Date   HGBA1C 8.5 (H) 06/05/2024   HGBA1C 7.3 (H) 02/28/2024   HGBA1C 6.7 (H) 07/25/2023     Lab Results  Component Value Date   ALBUMIN 4.5 06/05/2024   ALBUMIN 3.7 04/22/2024   ALBUMIN 4.8 02/28/2024    No results found for: MG Lab Results  Component Value Date   VD25OH 18.4 (L) 02/28/2024   VD25OH 37.4 04/28/2017    No results found for: PREALBUMIN    Latest Ref Rng & Units 04/22/2024    3:02 PM 04/22/2024    2:44 PM 02/28/2024   11:52 AM  CBC EXTENDED  WBC 4.0 - 10.5 K/uL  5.3  5.3   RBC 4.22 - 5.81 MIL/uL  4.51  4.85   Hemoglobin 13.0 - 17.0 g/dL 86.3  85.7  84.6   HCT 39.0 - 52.0 % 40.0  42.1  45.3   Platelets 150 - 400 K/uL  169  202   NEUT# 1.7 - 7.7 K/uL  3.5    Lymph# 0.7 - 4.0 K/uL  1.1       There is no height or weight on file to calculate BMI.  Orders:  No orders of the defined types were placed in this encounter.  No orders of the defined types were placed in this  encounter.    Procedures: No procedures performed  Clinical Data: No additional findings.  ROS:  All other systems negative, except as noted in the HPI. Review of Systems  Objective: Vital Signs: There were no vitals taken for this visit.  Specialty Comments:  No specialty comments available.  PMFS History: Patient Active Problem List   Diagnosis Date Noted   Amputated toe 06/14/2024   Stasis dermatitis 06/14/2024   Type 2 diabetes mellitus without complications (HCC) 06/14/2024   Obesity 06/14/2024   Essential hypertension 06/14/2024   CKD (chronic kidney disease) stage 2, GFR 60-89 ml/min 06/01/2022   PAD (peripheral artery disease) 02/12/2022   Hepatic steatosis 12/15/2020   Benign prostatic hyperplasia with urinary obstruction 08/06/2020   Type 2 diabetes mellitus with microalbuminuria, with long-term current use of insulin  (HCC) 03/27/2019   Influenza vaccine refused 05/29/2018   DOE (dyspnea on exertion) 11/25/2016   Acquired absence of organ 03/08/2016   Abnormal gait 03/08/2016   Incoordination 03/08/2016  Moderate recurrent major depression (HCC) 03/08/2016   Unsteadiness on feet 03/08/2016   Type 2 diabetes mellitus (HCC) 03/08/2016   Hyperlipidemia 03/08/2016   S/P unilateral BKA (below knee amputation), right (HCC) 12/12/2015   Hyperlipidemia associated with type 2 diabetes mellitus (HCC) 11/14/2015   Episodic mood disorder 08/11/2015   Vitamin D  deficiency 08/11/2015   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    Heart  disease Father    Diabetes Sister    Diabetes Brother    Aneurysm Brother        brain   Colon cancer Maternal Grandfather        passed in the mid-60's.   Heart attack Paternal Grandfather     Past Surgical History:  Procedure Laterality Date   AMPUTATION Right 12/12/2015   Procedure: Right Transmetatarsal Amputation With [facility] Placement;  Surgeon: Jerona Harden GAILS, MD;  Location: [facility] OR;  Service: Orthopedics;  Laterality: Right;   AMPUTATION Right 03/05/2016   Procedure: RIGHT BELOW KNEE AMPUTATION;  Surgeon: Jerona GAILS Harden, MD;  Location: [facility] OR;  Service: Orthopedics;  Laterality: Right;   CATARACT EXTRACTION W/PHACO Right 10/02/2018   Procedure: CATARACT EXTRACTION PHACO AND INTRAOCULAR LENS PLACEMENT (IOC);  Surgeon: Harrie Agent, MD;  Location: [facility] ORS;  Service: Ophthalmology;  Laterality: Right;  CDE: 130.53   COLONOSCOPY     COLONOSCOPY N/A 12/19/2019   Procedure: COLONOSCOPY;  Surgeon: Shaaron Lamar HERO, MD;  Diverticulosis in the sigmoid and descending colon, redundant colon, otherwise normal exam.    EYE SURGERY Left    Cataract   EYE SURGERY Right    r   ORBITAL RECONSTRUCTION Right    STUMP REVISION Right 11/10/2016   Procedure: REVISION RIGHT BELOW KNEE AMPUTATION;  Surgeon: Jerona Harden GAILS, MD;  Location: [facility] OR;  Service: Orthopedics;  Laterality: Right;   toe removal Right 2012,2014   Social History   Occupational History   Occupation: magazine features editor    Comment: Lawyer   Occupation: retired  Tobacco Use   Smoking status: Never   Smokeless tobacco: Never  Vaping Use   Vaping status: Never Used  Substance and Sexual Activity   Alcohol use: No   Drug use: No   Sexual activity: Not Currently    Birth control/protection: None       "

## 2024-08-14 NOTE — Progress Notes (Signed)
 " [facility] [address] [place]. [place] [address] [phone]       OFFICE CONSULT NOTE  Mr. Bodey Frizell Date of Birth:  [birth year] Medical Record Number:  [id number]   Referring MD: Cyndee Dada  Reason for Referral: TIA and syncope  HPI: Mr. Ramroop is a pleasant 74 year old male seen today for initial office consultation visit.  Accompanied by his wife.  History is obtained from them and review of electronic medical records.  I personally reviewed pertinent available imaging films in PACS.  He has past medical history of hypertension, hyperlipidemia, diabetes, diabetic neuropathy, right knee amputation chronic osteomyelitis of right fibula, sleep apnea and TIAs.  He presented on 04/22/2024 emergency room for evaluation for episode of brief loss of consciousness.  Patient was sitting with his wife in a restaurant after large he started feeling weak.  He passed out.  EMS arrived patient was little bit dazed and dizzy oriented.  He had left-sided weakness.  Patient symptoms resolved in the ambulance for the family so hospitalist.  No focal deficits but persistent.  MRI scan of the brain showed no acute abnormality only changes of age-related small CT angiogram of the brain and neck showed moderate bilateral M1 moderate left P1 assist.  LDL cholesterol on 06/05/2024 was elevated at 142 mg percent.  Hemoglobin A1c was 8.5.  Patient was advised to take Crestor  40 mg daily but he has not been taking it due to concern about side effects.  He had an echocardiogram on 07/24/2024.  Which showed ejection fraction 50 to 55%.  He has not had outpatient cardiac monitoring.  He denies any prior history of stroke or TIA even though I find a mention of TIA in his problem list.  He has no prior history of syncope, passing out or near syncopal symptoms.  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) 11/02/2016   Depression    Diabetes mellitus without complication (HCC)    Type II   Hyperlipidemia    Neuropathy    Neuropathy    Sleep apnea    TIA (transient ischemic attack) 04/22/2024    Social History:  Social History   Socioeconomic History   Marital status: Married    Spouse name: Rock   Number of children: 0   Years of education: 14   Highest education level: Not on file  Occupational History   Occupation: magazine features editor    Comment: Lawyer   Occupation: retired  Tobacco Use   Smoking status: Never   Smokeless tobacco: Never  Vaping Use   Vaping status: Never Used  Substance and Sexual Activity   Alcohol use: No   Drug use: No   Sexual activity: Not Currently    Birth control/protection: None  Other Topics Concern   Not on file  Social History Narrative   Not on file   Social Drivers of Health   Tobacco Use: Low Risk (08/14/2024)   Patient History    Smoking Tobacco Use: Never    Smokeless Tobacco Use: Never    Passive Exposure: Not on file  Financial Resource Strain: Low Risk (08/02/2023)   Overall Financial Resource Strain (CARDIA)    Difficulty of Paying Living Expenses: Not hard  at all  Food Insecurity: No Food Insecurity (08/02/2023)   Hunger Vital Sign    Worried About Running Out of Food in the Last Year: Never true    Ran Out of Food in the Last Year: Never true  Transportation Needs: No Transportation Needs (08/02/2023)   PRAPARE - Administrator, Civil Service (Medical): No    Lack of Transportation (Non-Medical): No  Physical Activity: Insufficiently Active (08/02/2023)   Exercise Vital Sign    Days of Exercise per Week: 3 days    Minutes of Exercise per Session: 30 min  Stress: No Stress Concern Present (08/02/2023)   [employer] of Occupational Health - Occupational Stress  Questionnaire    Feeling of Stress : Not at all  Social Connections: Moderately Integrated (08/02/2023)   Social Connection and Isolation Panel    Frequency of Communication with Friends and Family: More than three times a week    Frequency of Social Gatherings with Friends and Family: Three times a week    Attends Religious Services: More than 4 times per year    Active Member of Clubs or Organizations: No    Attends Banker Meetings: Never    Marital Status: Married  Catering Manager Violence: Not At Risk (08/02/2023)   Humiliation, Afraid, Rape, and Kick questionnaire    Fear of Current or Ex-Partner: No    Emotionally Abused: No    Physically Abused: No    Sexually Abused: No  Depression (PHQ2-9): Medium Risk (06/05/2024)   Depression (PHQ2-9)    PHQ-2 Score: 8  Alcohol Screen: Low Risk (08/02/2023)   Alcohol Screen    Last Alcohol Screening Score (AUDIT): 0  Housing: Unknown (08/02/2023)   Housing Stability Vital Sign    Unable to Pay for Housing in the Last Year: No    Number of Times Moved in the Last Year: Not on file    Homeless in the Last Year: No  Utilities: Not At Risk (08/02/2023)   [employer]HC Utilities    Threatened with loss of utilities: No  Health Literacy: Adequate Health Literacy (08/02/2023)   B1300 Health Literacy    Frequency of need for help with medical instructions: Never    Medications:  Medications Ordered Prior to Encounter[1]  Allergies:  Allergies[2]  Physical Exam General: well developed, well nourished, seated, in no evident distress Head: head normocephalic and atraumatic.   Neck: supple with no carotid or supraclavicular bruits Cardiovascular: regular rate and rhythm, no murmurs Musculoskeletal: no deformity right leg below-knee amputation Skin:  no rash/petichiae Vascular:  Normal pulses all extremities  Neurologic Exam Mental Status: Awake and fully alert. Oriented to place and time. Recent and remote memory intact. Attention span,  concentration and fund of knowledge appropriate. Mood and affect appropriate.  Cranial Nerves: Fundoscopic exam reveals sharp disc margins. Pupils equal, briskly reactive to light. Extraocular movements full without nystagmus. Visual fields full to confrontation. Hearing intact. Facial sensation intact. Face, tongue, palate moves normally and symmetrically.  Motor: Normal bulk and tone. Normal strength in all tested extremity muscles. Sensory.:  Diminished  touch , pinprick , position and vibratory sensation left leg from the knee below.  Coordination: Rapid alternating movements normal in all extremities. Finger-to-nose and heel-to-shin performed accurately bilaterally. Gait and Station: Arises from chair without difficulty. Stance is normal. Gait demonstrates slight dragging of the right leg..  Not able to heel, toe and tandem walk without difficulty.  Reflexes: 1+ and symmetric. Toes downgoing.   NIHSS  0 Modified Rankin  0   ASSESSMENT: 74 year old Caucasian male with transient episode of brief loss of consciousness followed by left-sided weakness likely right brain TIA due to severe intracranial stenosis and syncopal event.  Vascular risk factors of diabetes, hypertension, hyperlipidemia, intracranial stenosis, obesity and at risk for sleep apnea     PLAN:I had a long discussion with the patient and his wife regarding  likely representing a syncopal episode with a TIA on the background of intracranial stenosis.  I recommend continue aspirin  81 mg daily for stroke prevention and maintain aggressive risk factor modification with strict control of hypertension with blood pressure goal below 130/90, lipids with LDL cholesterol goal below 70 mg percent and diabetes with hemoglobin A1c goal below 6.5%.  I also encouraged him to eat a healthy diet with lots of fruits, vegetables, cereals and whole grains and to be active and exercise regularly and lose weight .he also appears to be at risk for sleep  apnea and I recommended referral for polysomnogram.  Check EEG for seizure activity and 30-day heart monitor for paroxysmal A-fib.  Return for follow-up in the future in 6 to 8 months for follow-up if necessary.   I personally spent a total of 50 minutes in the care of the patient today including getting/reviewing separately obtained history, performing a medically appropriate exam/evaluation, counseling and educating, placing orders, referring and communicating with other health care professionals, documenting clinical information in the EHR, independently interpreting results, and coordinating care.        Eather Popp, MD Note: This document was prepared with digital dictation and possible smart phrase technology. Any transcriptional errors that result from this process are unintentional.      [1]  Current Outpatient Medications on File Prior to Visit  Medication Sig Dispense Refill   insulin  glargine, 2 Unit Dial, (TOUJEO  MAX SOLOSTAR) 300 UNIT/ML Solostar Pen Inject 60 Units into the skin daily. Please alert to change in med due to insurance.  Tresiba  no longer covered. 15 mL 4   Multiple Vitamin (MULTIVITAMIN WITH MINERALS) TABS tablet Take 1 tablet by mouth daily. Equate Multivitamin for Men 50+     NOVOLOG  FLEXPEN 100 UNIT/ML FlexPen INJECT 10 UNITS SUBCUTANEOUSLY TWICE DAILY WITH BREAKFAST AND LUNCH, AND 8-9 UNITS DAILY WITH SUPPER 15 mL 0   doxycycline  (VIBRA -TABS) 100 MG tablet Take 1 tablet (100 mg total) by mouth 2 (two) times daily. (Patient not taking: Reported on 08/14/2024) 28 tablet 0   ketoconazole  (NIZORAL ) 2 % shampoo Lather affected areas on face and leave on for 10 mins. Then rinse.  Use twice weekly until rash resolves. (Patient not taking: Reported on 08/14/2024) 120 mL 2   Vitamin D , Ergocalciferol , (DRISDOL ) 1.25 MG (50000 UNIT) CAPS capsule Take 1 capsule (50,000 Units total) by mouth every 7 (seven) days. (Patient not taking: Reported on 08/14/2024) 12 capsule 3   No  current facility-administered medications on file prior to visit.  [2]  Allergies Allergen Reactions   Bactrim  [Sulfamethoxazole -Trimethoprim ] Itching   Gentamicin Other (See Comments)    Unknown   "

## 2024-08-15 ENCOUNTER — Encounter: Payer: Self-pay | Admitting: Physician Assistant

## 2024-08-15 ENCOUNTER — Other Ambulatory Visit: Admitting: *Deleted

## 2024-08-27 ENCOUNTER — Other Ambulatory Visit: Admitting: *Deleted

## 2024-08-28 ENCOUNTER — Other Ambulatory Visit: Payer: Self-pay | Admitting: Family Medicine

## 2024-08-28 DIAGNOSIS — E1129 Type 2 diabetes mellitus with other diabetic kidney complication: Secondary | ICD-10-CM

## 2024-09-04 ENCOUNTER — Other Ambulatory Visit: Admitting: *Deleted

## 2024-09-17 ENCOUNTER — Ambulatory Visit: Payer: Self-pay | Admitting: Family Medicine

## 2025-03-04 ENCOUNTER — Encounter: Payer: Self-pay | Admitting: Family Medicine

## 2025-04-22 ENCOUNTER — Ambulatory Visit: Admitting: Neurology
